# Patient Record
Sex: Male | Born: 1980 | Race: White | Hispanic: No
Health system: Midwestern US, Community
[De-identification: ages and names within clinical notes are randomized; demographics above are authoritative.]

## PROBLEM LIST (undated history)

## (undated) DIAGNOSIS — K219 Gastro-esophageal reflux disease without esophagitis: Secondary | ICD-10-CM

## (undated) DIAGNOSIS — F3181 Bipolar II disorder: Principal | ICD-10-CM

## (undated) DIAGNOSIS — K269 Duodenal ulcer, unspecified as acute or chronic, without hemorrhage or perforation: Secondary | ICD-10-CM

## (undated) DIAGNOSIS — G43909 Migraine, unspecified, not intractable, without status migrainosus: Secondary | ICD-10-CM

## (undated) DIAGNOSIS — I639 Cerebral infarction, unspecified: Secondary | ICD-10-CM

## (undated) DIAGNOSIS — G40909 Epilepsy, unspecified, not intractable, without status epilepticus: Secondary | ICD-10-CM

## (undated) HISTORY — PX: TONSILLECTOMY: SUR1361

## (undated) HISTORY — DX: Gastro-esophageal reflux disease without esophagitis: K21.9

## (undated) HISTORY — DX: Epilepsy, unspecified, not intractable, without status epilepticus: G40.909

---

## 2016-06-22 ENCOUNTER — Encounter: Admit: 2016-06-23

## 2016-06-22 DIAGNOSIS — R079 Chest pain, unspecified: Secondary | ICD-10-CM

## 2016-06-22 NOTE — ED Provider Notes (Signed)
Chest pain  HPI Comments: Patient is a 35 year old male presents for chest pain. The patient states that he was riding home from [place] car and he experienced a 9 out of 10 radiating to the left arm pressure sensation over his chest primarily located on the left side, this began roughly an hour ago. It is accompanied with weakness and numbness of his left upper and lower extremities. Currently has a bilateral headache with associated sharp pain that is pulsatile behind his left eye. The patient also complains of difficulty  Urination and pain over his left and right lower quadrants of his abdomen. He is also had nausea and vomiting over the last hour. He denies diarrhea or fever or chills. He has no history of DVT    The history is provided by the patient. No language interpreter was used.       Review of Systems   Constitutional: Negative for chills and fever.   HENT: Negative for rhinorrhea and sore throat.    Eyes: Positive for pain.   Respiratory: Positive for chest tightness and shortness of breath.    Cardiovascular: Positive for chest pain. Negative for palpitations.   Gastrointestinal: Positive for abdominal pain, nausea and vomiting. Negative for diarrhea.   Genitourinary: Positive for difficulty urinating.   Musculoskeletal: Negative for neck pain and neck stiffness.   Neurological: Positive for weakness, numbness and headaches.        Left-sided weakness and numbness in the upper and lower extremity    Headache is described as bilateral continuous, with sharp pains behind left eye.         Physical Exam   Constitutional: He is oriented to person, place, and time. He appears well-developed and well-nourished. He appears distressed.   HENT:   Head: Normocephalic and atraumatic.   Eyes: EOM are normal. Pupils are equal, round, and reactive to light.   Neck: Normal range of motion. Neck supple. No tracheal deviation present. No thyromegaly present.   Cardiovascular: Normal rate, regular rhythm, normal  heart sounds and intact distal pulses.  Exam reveals no gallop and no friction rub.    No murmur heard.  Pulmonary/Chest: Effort normal and breath sounds normal. No respiratory distress. He has no wheezes. He has no rales. He exhibits tenderness.   Abdominal: Soft. Bowel sounds are normal. He exhibits no distension. There is tenderness.   Tender to palpation over left and right lower quadrants and suprapubic tenderness.   Musculoskeletal: He exhibits no edema or deformity.   Decreased strength over left upper and lower extremity. Decreased sensation over  left  upper and lower extremity through all dermatomes   Neurological: He is alert and oriented to person, place, and time. No cranial nerve deficit.   Skin: He is not diaphoretic.     EKG Interpretation    Interpreted by emergency department physician    Rhythm: normal sinus   Rate: normal  Axis: normal  Ectopy: none  Conduction: normal  ST Segments: no acute change  T Waves: no acute change  Q Waves: none    Clinical Impression: no acute changes      Procedures    MDM    ED Course     NIH Stroke Scale/Score at time of initial evaluation:  1A: Level of Consciousness 0 - alert; keenly responsive   1B: Ask Month and Age 5 - answers both questions correctly   1C: Tell Patient To Open and Close Eyes, then Hand Grip Squeeze 0 - performs both  tasks correctly   2: Test Horizontal Extraocular Movements 0 - normal   3: Test Visual Fields 0 - no visual loss   4: Test Facial Palsy 0 - normal symmetric movement   5A: Test Left Arm Motor Drift 0 - no drift, limb holds 90 (or 45) degrees for full 10 seconds   5B: Test Right Arm Motor Drift 0 - no drift, limb holds 90 (or 45) degrees for full 10 seconds   6A: Test Left Leg Motor Drift 0 - no drift; leg holds 30 degree position for full 5 seconds   6B: Test Right Leg Motor Drift 0 - no drift; leg holds 30 degree position for full 5 seconds   7: Test Limb Ataxia   (FNF/Heel-Shin) 0 - absent   8: Test Sensation 2 - severe to total  sensory loss; patient is not aware of being touched in face, arm, leg   9: Test Language/Aphasia 0 - no aphasia, normal   10: Test Dysarthria 0 - normal   11: Test Extinction/Inattention 0 - no abnormality   Total 2         Bladder scan  185 ml      --------------------------------------------- PAST HISTORY ---------------------------------------------  Past Medical History:  has a past medical history of Anxiety; GERD (gastroesophageal reflux disease); Glaucoma; Personality disorder; PTSD (post-traumatic stress disorder); and PUD (peptic ulcer disease).    Past Surgical History:  has no past surgical history on file.    Social History:  reports that he has never smoked. He does not have any smokeless tobacco history on file. He reports that he does not drink alcohol or use illicit drugs.    Family History: family history is not on file.     The patient???s home medications have been reviewed.    Allergies: Penicillins    -------------------------------------------------- RESULTS -------------------------------------------------    LABS:  Results for orders placed or performed during the hospital encounter of 06/22/16   CBC auto differential   Result Value Ref Range    WBC 7.0 4.5 - 11.5 E9/L    RBC 4.91 3.80 - 5.80 E12/L    Hemoglobin 14.2 12.5 - 16.5 g/dL    Hematocrit 09.8 11.9 - 54.0 %    MCV 84.5 80.0 - 99.9 fL    MCH 28.9 26.0 - 35.0 pg    MCHC 34.2 32.0 - 34.5 %    RDW 12.1 11.5 - 15.0 fL    Platelets 244 130 - 450 E9/L    MPV 10.3 7.0 - 12.0 fL    Neutrophils % 52.7 43.0 - 80.0 %    Immature Granulocytes % 0.3 0.0 - 5.0 %    Lymphocytes % 34.6 20.0 - 42.0 %    Monocytes % 8.5 2.0 - 12.0 %    Eosinophils % 2.6 0.0 - 6.0 %    Basophils % 1.3 0.0 - 2.0 %    Neutrophils # 3.68 1.80 - 7.30 E9/L    Immature Granulocytes # 0.02 E9/L    Lymphocytes # 2.41 1.50 - 4.00 E9/L    Monocytes # 0.59 0.10 - 0.95 E9/L    Eosinophils # 0.18 0.05 - 0.50 E9/L    Basophils # 0.09 0.00 - 0.20 E9/L   Comprehensive metabolic panel    Result Value Ref Range    Sodium 140 132 - 146 mmol/L    Potassium 3.9 3.5 - 5.0 mmol/L    Chloride 102 98 - 107 mmol/L    CO2 25 22 -  29 mmol/L    Anion Gap 13 7 - 16 mmol/L    Glucose 93 74 - 109 mg/dL    BUN 9 6 - 20 mg/dL    CREATININE 1.0 0.7 - 1.2 mg/dL    GFR Non-African American >60 >=60 mL/min/1.73    GFR African American >60     Calcium 8.7 8.6 - 10.2 mg/dL    Total Protein 6.6 6.4 - 8.3 g/dL    Alb 3.8 3.5 - 5.2 g/dL    Total Bilirubin 0.2 0.0 - 1.2 mg/dL    Alkaline Phosphatase 74 40 - 129 U/L    ALT 13 0 - 40 U/L    AST 14 0 - 39 U/L   Troponin   Result Value Ref Range    Troponin <0.01 0.00 - 0.03 ng/mL   APTT   Result Value Ref Range    aPTT 33.7 24.5 - 35.1 sec   Protime-INR   Result Value Ref Range    Protime 11.9 9.3 - 12.4 sec    INR 1.0        RADIOLOGY:  XR Chest Limited One View   Final Result   1. Unremarkable chest.      CT Head WO Contrast   Final Result   1.  Unremarkable noncontrast CT of the brain.                 ------------------------- NURSING NOTES AND VITALS REVIEWED ---------------------------  Date / Time Roomed:  06/22/2016 10:13 PM  ED Bed Assignment:  01/01    The nursing notes within the ED encounter and vital signs as below have been reviewed.     Patient Vitals for the past 24 hrs:   BP Temp Temp src Pulse Resp SpO2 Height Weight   06/22/16 2222 132/82 98.2 ??F (36.8 ??C) Oral 64 18 96 % 6\' 1"  (1.854 m) 206 lb (93.4 kg)       Oxygen Saturation Interpretation: Normal    ------------------------------------------ PROGRESS NOTES ------------------------------------------  Re-evaluation(s):  Time: 2350.  Patient???s symptoms show no change  Repeat physical examination is not changed    Counseling:  I have spoken with the patient and discussed today???s results, in addition to providing specific details for the plan of care and counseling regarding the diagnosis and prognosis.  Their questions are answered at this time and they are agreeable with the plan of  admission.    --------------------------------- ADDITIONAL PROVIDER NOTES ---------------------------------  Consultations:  Time: Spoke with Dr. Ike Bene [facility]).  Discussed case.  They will accept the patient.  This patient's ED course included: a personal history and physicial examination, re-evaluation prior to disposition, multiple bedside re-evaluations, IV medications, cardiac monitoring and continuous pulse oximetry    This patient has remained hemodynamically stable during their ED course.    Diagnosis:  1. Chest pain, unspecified type    2. Left sided numbness        Disposition:  Patient's disposition: Transfer to [facility]  Patient's condition is stable.    ATTENDING PROVIDER ATTESTATION:     I have personally performed and/or participated in the history, exam, medical decision making, and procedures and agree with all pertinent clinical information.      I have also reviewed and agree with the past medical, family and social history unless otherwise noted.    I have discussed this patient in detail with the resident, and provided the instruction and education regarding headaches, chest pain, left sided numbness and CVA.  My findings/Plan: Heart RRR. Lungs CTA bilaterally. Abdomen soft, nontender. Bowel sounds normal.  NIH is 2 for left sided sensory loss. Will transfer to [facility] for neurological evaluation.       Ladon Applebaum, DO  06/23/16 [id number]

## 2016-06-23 ENCOUNTER — Observation Stay
Admit: 2016-06-23 | Discharge: 2016-06-26 | Disposition: A | Discharge status: Home / Self Care | Payer: MEDICAID | Source: Other Acute Inpatient Hospital | Attending: Internal Medicine | Admitting: Internal Medicine

## 2016-06-23 ENCOUNTER — Inpatient Hospital Stay
Admit: 2016-06-23 | Discharge: 2016-06-23 | Disposition: A | Discharge status: Other Acute Inpatient Hospital | Attending: Emergency Medicine

## 2016-06-23 LAB — TROPONIN
Troponin: <0.01 ng/mL (ref 0.00–0.03)
Troponin: <0.01 ng/mL (ref 0.00–0.03)
Troponin: <0.01 ng/mL (ref 0.00–0.03)

## 2016-06-23 LAB — CBC WITH AUTO DIFFERENTIAL
Basophils %: 1.3 % (ref 0.0–2.0)
Basophils Absolute: 0.09 E9/L (ref 0.00–0.20)
Eosinophils %: 2.6 % (ref 0.0–6.0)
Eosinophils Absolute: 0.18 E9/L (ref 0.05–0.50)
Hematocrit: 41.5 % (ref 37.0–54.0)
Hemoglobin: 14.2 g/dL (ref 12.5–16.5)
Immature Granulocytes #: 0.02 E9/L
Immature Granulocytes %: 0.3 % (ref 0.0–5.0)
Lymphocytes %: 34.6 % (ref 20.0–42.0)
Lymphocytes Absolute: 2.41 E9/L (ref 1.50–4.00)
MCH: 28.9 pg (ref 26.0–35.0)
MCHC: 34.2 % (ref 32.0–34.5)
MCV: 84.5 fL (ref 80.0–99.9)
MPV: 10.3 fL (ref 7.0–12.0)
Monocytes %: 8.5 % (ref 2.0–12.0)
Monocytes Absolute: 0.59 E9/L (ref 0.10–0.95)
Neutrophils %: 52.7 % (ref 43.0–80.0)
Neutrophils Absolute: 3.68 E9/L (ref 1.80–7.30)
Platelets: 244 E9/L (ref 130–450)
RBC: 4.91 E12/L (ref 3.80–5.80)
RDW: 12.1 fL (ref 11.5–15.0)
WBC: 7 E9/L (ref 4.5–11.5)

## 2016-06-23 LAB — URINALYSIS
Bilirubin Urine: NEGATIVE
Blood, Urine: NEGATIVE
Glucose, Ur: NEGATIVE mg/dL
Ketones, Urine: NEGATIVE mg/dL
Leukocyte Esterase, Urine: NEGATIVE
Nitrite, Urine: NEGATIVE
Protein, UA: NEGATIVE mg/dL
Specific Gravity, UA: 1.02 (ref 1.005–1.030)
Urobilinogen, Urine: 0.2 E.U./dL (ref <2.0)
pH, UA: 6 (ref 5.0–9.0)

## 2016-06-23 LAB — EKG 12-LEAD
Atrial Rate: 57 {beats}/min
Atrial Rate: 57 {beats}/min
P Axis: 19 degrees
P Axis: 5 degrees
P-R Interval: 172 ms
P-R Interval: 188 ms
Q-T Interval: 376 ms
Q-T Interval: 398 ms
QRS Duration: 82 ms
QRS Duration: 90 ms
QTc Calculation (Bazett): 365 ms
QTc Calculation (Bazett): 387 ms
R Axis: 52 degrees
R Axis: 69 degrees
T Axis: 37 degrees
T Axis: 49 degrees
Ventricular Rate: 57 {beats}/min
Ventricular Rate: 57 {beats}/min

## 2016-06-23 LAB — D-DIMER, QUANTITATIVE: D-Dimer, Quant: <200 ng/mL DDU

## 2016-06-23 LAB — URINE DRUG SCREEN
Amphetamine Screen, Urine: NOT DETECTED (ref <1000)
Amphetamine Screen, Urine: NOT DETECTED (ref <1000)
Barbiturate Screen, Ur: NOT DETECTED (ref <200)
Barbiturate Screen, Ur: NOT DETECTED (ref <200)
Benzodiazepine Screen, Urine: NOT DETECTED (ref <200)
Benzodiazepine Screen, Urine: NOT DETECTED (ref <200)
Cannabinoid Scrn, Ur: NOT DETECTED
Cannabinoid Scrn, Ur: NOT DETECTED
Cocaine Metabolite Screen, Urine: NOT DETECTED (ref <300)
Cocaine Metabolite Screen, Urine: NOT DETECTED (ref <300)
Methadone Screen, Urine: NOT DETECTED (ref <300)
Methadone Screen, Urine: NOT DETECTED (ref <300)
Opiate Scrn, Ur: NOT DETECTED
Opiate Scrn, Ur: NOT DETECTED
PCP Screen, Urine: NOT DETECTED (ref <25)
PCP Screen, Urine: NOT DETECTED (ref <25)
Propoxyphene Scrn, Ur: NOT DETECTED (ref <300)
Propoxyphene Scrn, Ur: NOT DETECTED (ref <300)

## 2016-06-23 LAB — COMPREHENSIVE METABOLIC PANEL
ALT: 13 U/L (ref 0–40)
AST: 14 U/L (ref 0–39)
Albumin: 3.8 g/dL (ref 3.5–5.2)
Alkaline Phosphatase: 74 U/L (ref 40–129)
Anion Gap: 13 mmol/L (ref 7–16)
BUN: 9 mg/dL (ref 6–20)
CO2: 25 mmol/L (ref 22–29)
Calcium: 8.7 mg/dL (ref 8.6–10.2)
Chloride: 102 mmol/L (ref 98–107)
Creatinine: 1 mg/dL (ref 0.7–1.2)
GFR African American: >60
GFR Non-African American: >60 mL/min/{1.73_m2} (ref >=60)
Glucose: 93 mg/dL (ref 74–109)
Potassium: 3.9 mmol/L (ref 3.5–5.0)
Sodium: 140 mmol/L (ref 132–146)
Total Bilirubin: 0.2 mg/dL (ref 0.0–1.2)
Total Protein: 6.6 g/dL (ref 6.4–8.3)

## 2016-06-23 LAB — PROTIME-INR
INR: 1
Protime: 11.9 s (ref 9.3–12.4)

## 2016-06-23 LAB — APTT: aPTT: 33.7 s (ref 24.5–35.1)

## 2016-06-23 MED ORDER — LOPERAMIDE HCL 2 MG PO CAPS
2 MG | ORAL | Status: DC | PRN
Start: 2016-06-23 — End: 2016-06-26

## 2016-06-23 MED ORDER — ONDANSETRON HCL 4 MG/2ML IJ SOLN
4 MG/2ML | INTRAMUSCULAR | Status: DC
Start: 2016-06-23 — End: 2016-06-23

## 2016-06-23 MED ORDER — PRAZOSIN HCL 2 MG PO CAPS
2 MG | Freq: Every evening | ORAL | Status: DC
Start: 2016-06-23 — End: 2016-06-26
  Administered 2016-06-24 – 2016-06-26 (×3): 2 mg via ORAL

## 2016-06-23 MED ORDER — NORTRIPTYLINE HCL 25 MG PO CAPS
25 MG | Freq: Every evening | ORAL | Status: DC
Start: 2016-06-23 — End: 2016-06-26
  Administered 2016-06-24 – 2016-06-26 (×3): 50 mg via ORAL

## 2016-06-23 MED ORDER — METOCLOPRAMIDE HCL 5 MG/ML IJ SOLN
5 MG/ML | Freq: Once | INTRAMUSCULAR | Status: AC
Start: 2016-06-23 — End: 2016-06-22
  Administered 2016-06-23: 03:00:00 10 mg via INTRAVENOUS

## 2016-06-23 MED ORDER — SIMETHICONE 80 MG PO CHEW
80 MG | Freq: Four times a day (QID) | ORAL | Status: DC
Start: 2016-06-23 — End: 2016-06-26
  Administered 2016-06-23 – 2016-06-26 (×13): 80 mg via ORAL

## 2016-06-23 MED ORDER — METOCLOPRAMIDE HCL 5 MG/ML IJ SOLN
5 MG/ML | Freq: Once | INTRAMUSCULAR | Status: DC
Start: 2016-06-23 — End: 2016-06-26

## 2016-06-23 MED ORDER — PANTOPRAZOLE SODIUM 40 MG PO TBEC
40 MG | Freq: Every day | ORAL | Status: DC
Start: 2016-06-23 — End: 2016-06-26
  Administered 2016-06-23 – 2016-06-26 (×3): 40 mg via ORAL

## 2016-06-23 MED ORDER — KETOROLAC TROMETHAMINE 30 MG/ML IJ SOLN
30 MG/ML | Freq: Once | INTRAMUSCULAR | Status: DC
Start: 2016-06-23 — End: 2016-06-23

## 2016-06-23 MED ORDER — SODIUM CHLORIDE 0.9 % IV SOLN
0.9 % | INTRAVENOUS | Status: DC
Start: 2016-06-23 — End: 2016-06-26
  Administered 2016-06-23 – 2016-06-24 (×3): via INTRAVENOUS

## 2016-06-23 MED ORDER — ACETAMINOPHEN 325 MG PO TABS
325 MG | ORAL | Status: DC | PRN
Start: 2016-06-23 — End: 2016-06-26
  Administered 2016-06-23: 10:00:00 650 mg via ORAL

## 2016-06-23 MED ORDER — DIPHENHYDRAMINE HCL 50 MG/ML IJ SOLN
50 MG/ML | Freq: Four times a day (QID) | INTRAMUSCULAR | Status: DC | PRN
Start: 2016-06-23 — End: 2016-06-26

## 2016-06-23 MED ORDER — ALUM & MAG HYDROXIDE-SIMETH 200-200-20 MG/5ML PO SUSP
200-200-20 MG/5ML | ORAL | Status: DC | PRN
Start: 2016-06-23 — End: 2016-06-26

## 2016-06-23 MED ORDER — ASPIRIN 81 MG PO CHEW
81 MG | Freq: Once | ORAL | Status: AC
Start: 2016-06-23 — End: 2016-06-22
  Administered 2016-06-23: 03:00:00 324 mg via ORAL

## 2016-06-23 MED ORDER — VALPROATE SODIUM 100 MG/ML IV SOLN
100 MG/ML | Freq: Once | INTRAVENOUS | Status: AC
Start: 2016-06-23 — End: 2016-06-23
  Administered 2016-06-23: 19:00:00 1000 mg via INTRAVENOUS

## 2016-06-23 MED ORDER — GABAPENTIN 600 MG PO TABS
600 MG | Freq: Every evening | ORAL | Status: DC
Start: 2016-06-23 — End: 2016-06-26
  Administered 2016-06-24 – 2016-06-26 (×3): 600 mg via ORAL

## 2016-06-23 MED ORDER — DIPHENHYDRAMINE HCL 50 MG/ML IJ SOLN
50 MG/ML | Freq: Once | INTRAMUSCULAR | Status: AC
Start: 2016-06-23 — End: 2016-06-22
  Administered 2016-06-23: 03:00:00 25 mg via INTRAVENOUS

## 2016-06-23 MED ORDER — NORMAL SALINE FLUSH 0.9 % IV SOLN
0.9 % | Freq: Two times a day (BID) | INTRAVENOUS | Status: DC
Start: 2016-06-23 — End: 2016-06-26

## 2016-06-23 MED ORDER — ONDANSETRON HCL 4 MG/2ML IJ SOLN
4 MG/2ML | Freq: Four times a day (QID) | INTRAMUSCULAR | Status: DC | PRN
Start: 2016-06-23 — End: 2016-06-26

## 2016-06-23 MED ORDER — ENOXAPARIN SODIUM 40 MG/0.4ML SC SOLN
40 MG/0.4ML | Freq: Every day | SUBCUTANEOUS | Status: DC
Start: 2016-06-23 — End: 2016-06-26
  Administered 2016-06-23 – 2016-06-25 (×3): 40 mg via SUBCUTANEOUS

## 2016-06-23 MED ORDER — METOCLOPRAMIDE HCL 5 MG/ML IJ SOLN
5 MG/ML | Freq: Once | INTRAMUSCULAR | Status: DC
Start: 2016-06-23 — End: 2016-06-23

## 2016-06-23 MED ORDER — BUSPIRONE HCL 10 MG PO TABS
10 MG | Freq: Two times a day (BID) | ORAL | Status: DC
Start: 2016-06-23 — End: 2016-06-26
  Administered 2016-06-23 – 2016-06-26 (×7): 10 mg via ORAL

## 2016-06-23 MED ORDER — METOCLOPRAMIDE HCL 5 MG/ML IJ SOLN
5 MG/ML | Freq: Four times a day (QID) | INTRAMUSCULAR | Status: DC | PRN
Start: 2016-06-23 — End: 2016-06-26
  Administered 2016-06-23: 10:00:00 10 mg via INTRAVENOUS

## 2016-06-23 MED ORDER — DIVALPROEX SODIUM ER 500 MG PO TB24
500 MG | Freq: Once | ORAL | Status: DC
Start: 2016-06-23 — End: 2016-06-23

## 2016-06-23 MED ORDER — MAGNESIUM SULFATE 2000 MG/50 ML IVPB PREMIX
2 GM/50ML | Freq: Once | INTRAVENOUS | Status: AC
Start: 2016-06-23 — End: 2016-06-23
  Administered 2016-06-23: 21:00:00 2 g via INTRAVENOUS

## 2016-06-23 MED ORDER — OXYCODONE-ACETAMINOPHEN 5-325 MG PO TABS
5-325 MG | ORAL | Status: DC | PRN
Start: 2016-06-23 — End: 2016-06-26
  Administered 2016-06-23 – 2016-06-26 (×14): 1 via ORAL

## 2016-06-23 MED ORDER — LATANOPROST 0.005 % OP SOLN
0.005 % | Freq: Every evening | OPHTHALMIC | Status: DC
Start: 2016-06-23 — End: 2016-06-26
  Administered 2016-06-24 (×2): 1 [drp] via OPHTHALMIC

## 2016-06-23 MED ORDER — KETOROLAC TROMETHAMINE 30 MG/ML IJ SOLN
30 MG/ML | Freq: Four times a day (QID) | INTRAMUSCULAR | Status: DC | PRN
Start: 2016-06-23 — End: 2016-06-26
  Administered 2016-06-23 – 2016-06-25 (×5): 30 mg via INTRAVENOUS

## 2016-06-23 MED ORDER — SODIUM CHLORIDE 0.9 % IV SOLN
0.9 % | Freq: Once | INTRAVENOUS | Status: AC
Start: 2016-06-23 — End: 2016-06-23
  Administered 2016-06-23: 03:00:00 1000 mL via INTRAVENOUS

## 2016-06-23 MED ORDER — MAGNESIUM HYDROXIDE 400 MG/5ML PO SUSP
400 MG/5ML | Freq: Every day | ORAL | Status: DC | PRN
Start: 2016-06-23 — End: 2016-06-26
  Administered 2016-06-24 (×2): 30 mL via ORAL

## 2016-06-23 MED ORDER — METOCLOPRAMIDE HCL 5 MG/ML IJ SOLN
5 MG/ML | Freq: Four times a day (QID) | INTRAMUSCULAR | Status: DC
Start: 2016-06-23 — End: 2016-06-23
  Administered 2016-06-23: 19:00:00 10 mg via INTRAVENOUS

## 2016-06-23 MED ORDER — NORMAL SALINE FLUSH 0.9 % IV SOLN
0.9 % | INTRAVENOUS | Status: DC | PRN
Start: 2016-06-23 — End: 2016-06-26
  Administered 2016-06-23: 10:00:00 10 mL via INTRAVENOUS

## 2016-06-23 MED FILL — CHILDRENS ASPIRIN 81 MG PO CHEW: 81 MG | ORAL | Qty: 4

## 2016-06-23 MED FILL — DIPHENHYDRAMINE HCL 50 MG/ML IJ SOLN: 50 MG/ML | INTRAMUSCULAR | Qty: 1

## 2016-06-23 MED FILL — OXYCODONE-ACETAMINOPHEN 5-325 MG PO TABS: 5-325 MG | ORAL | Qty: 1

## 2016-06-23 MED FILL — NORTRIPTYLINE HCL 25 MG PO CAPS: 25 MG | ORAL | Qty: 2

## 2016-06-23 MED FILL — BUSPIRONE HCL 10 MG PO TABS: 10 MG | ORAL | Qty: 1

## 2016-06-23 MED FILL — VALPROATE SODIUM 500 MG/5ML IV SOLN: 500 MG/5ML | INTRAVENOUS | Qty: 10

## 2016-06-23 MED FILL — METOCLOPRAMIDE HCL 5 MG/ML IJ SOLN: 5 MG/ML | INTRAMUSCULAR | Qty: 2

## 2016-06-23 MED FILL — GABAPENTIN 600 MG PO TABS: 600 MG | ORAL | Qty: 1

## 2016-06-23 MED FILL — XALATAN 0.005 % OP SOLN: 0.005 % | OPHTHALMIC | Qty: 2.5

## 2016-06-23 MED FILL — MAGNESIUM SULFATE 2 GM/50ML IV SOLN: 2 GM/50ML | INTRAVENOUS | Qty: 50

## 2016-06-23 MED FILL — SIMETHICONE 80 MG PO CHEW: 80 MG | ORAL | Qty: 1

## 2016-06-23 MED FILL — DIVALPROEX SODIUM ER 500 MG PO TB24: 500 MG | ORAL | Qty: 2

## 2016-06-23 MED FILL — ONDANSETRON HCL 4 MG/2ML IJ SOLN: 4 MG/2ML | INTRAMUSCULAR | Qty: 2

## 2016-06-23 MED FILL — TYLENOL 325 MG PO TABS: 325 MG | ORAL | Qty: 2

## 2016-06-23 MED FILL — SODIUM CHLORIDE 0.9 % IV SOLN: 0.9 % | INTRAVENOUS | Qty: 1000

## 2016-06-23 MED FILL — NORMAL SALINE FLUSH 0.9 % IV SOLN: 0.9 % | INTRAVENOUS | Qty: 10

## 2016-06-23 MED FILL — PANTOPRAZOLE SODIUM 40 MG PO TBEC: 40 MG | ORAL | Qty: 1

## 2016-06-23 MED FILL — NORMAL SALINE FLUSH 0.9 % IV SOLN: 0.9 % | INTRAVENOUS | Qty: 30

## 2016-06-23 MED FILL — KETOROLAC TROMETHAMINE 30 MG/ML IJ SOLN: 30 MG/ML | INTRAMUSCULAR | Qty: 1

## 2016-06-23 MED FILL — PRAZOSIN HCL 2 MG PO CAPS: 2 MG | ORAL | Qty: 1

## 2016-06-23 MED FILL — LOVENOX 40 MG/0.4ML SC SOLN: 40 MG/0.4ML | SUBCUTANEOUS | Qty: 0.4

## 2016-06-23 NOTE — Plan of Care (Signed)
Problem: Falls - Risk of  Goal: Absence of falls  Outcome: Met This Shift

## 2016-06-23 NOTE — Progress Notes (Signed)
Pt sees Dr. Lester Kinsman at [facility] clinic in [place], [place].

## 2016-06-23 NOTE — H&P (Addendum)
Hospital Medicine History & Physical      PCP: No primary care provider on file.    Date of Admission: 06/23/2016    Date of Service: Pt seen/examined on 06/23/16 and placed in Observation.    Chief Complaint:  Left sided weakness / headache / chest pain      History Of Present Illness:    35 y.o. male with PMH below who presented to [facility]. [facility] as a transfer from [facility] with a variety of complaints, including severe headache, left-sided arm and leg weakness, and chest pain.  History was provided mostly by the pt's fiancee, who is a very good historian. The patient states that he was riding home from [place] by car, and he experienced weakness and numbness of his left arm and leg yesterday evening.  He also complained of significant sharp, pulsatile pain behind his left eye.  The pt has a history of migraines, treated by a neurologist at the [place].  He states that the headache pain is not new (rates it as "12/10"), but the weakness and numbness on the left side is new as of yesterday.  In addition, he complains of chest pain on the left side (feels "on fire"), along with right-sided chest pressure.  The patient also complains of difficulty with urination, and pain over his lower abdomen.  He also has complained of nausea and vomiting, but denies diarrhea, fever, or chills.  His fiancee is also concerned that the pt is having trouble expressing his thoughts since these symptoms began.      Past Medical History:          Diagnosis Date   ??? Anxiety    ??? GERD (gastroesophageal reflux disease)    ??? Glaucoma    ??? Personality disorder    ??? PTSD (post-traumatic stress disorder)    ??? PUD (peptic ulcer disease)        Past Surgical History:      No past surgical history on file.    Medications Prior to Admission:      Prior to Admission medications    Medication Sig Start Date End Date Taking? Authorizing Provider   loperamide (IMODIUM) 2 MG capsule Take 2 mg by mouth as needed for Diarrhea    Historical Provider,  MD   gabapentin (NEURONTIN) 600 MG tablet Take 600 mg by mouth nightly    Historical Provider, MD   Cholecalciferol (VITAMIN D3) 3000 units TABS Take by mouth    Historical Provider, MD   latanoprost (XALATAN) 0.005 % ophthalmic solution 1 drop nightly    Historical Provider, MD   nortriptyline (PAMELOR) 50 MG capsule Take 50 mg by mouth nightly    Historical Provider, MD   simethicone (MYLICON) 80 MG chewable tablet Take 80 mg by mouth 4 times daily (before meals and nightly)    Historical Provider, MD   busPIRone (BUSPAR) 10 MG tablet Take 10 mg by mouth 2 times daily    Historical Provider, MD   omeprazole (PRILOSEC) 20 MG delayed release capsule Take 20 mg by mouth 2 times daily    Historical Provider, MD   prazosin (MINIPRESS) 2 MG capsule Take 2 mg by mouth nightly    Historical Provider, MD       Allergies:  Penicillins    Social History:      The patient currently lives at home with family.    TOBACCO:   reports that he has never smoked. He does not have any smokeless  tobacco history on file.  ETOH:   reports that he does not drink alcohol.      Family History:       Reviewed in detail and negative for DM, CAD, Cancer, CVA. Positive as follows:    No family history on file.    REVIEW OF SYSTEMS:   Pertinent positives as noted in the HPI. All other systems reviewed and negative.    PHYSICAL EXAM:    BP 109/71   Pulse 57   Temp 97.2 ??F (36.2 ??C) (Oral)    Resp 16    General appearance:  No apparent distress, appears ill, otherwise cooperative.  HEENT:  Normal cephalic, atraumatic.  Extra ocular muscles intact. Conjunctivae/corneas clear.  Neck: Supple, no jugular venous distention. Trachea midline.  Respiratory:  Normal respiratory effort. Clear to auscultation, bilaterally without Rales/Wheezes/Rhonchi.  Cardiovascular:  Regular rate and rhythm with normal S1/S2 without murmurs, rubs or gallops.  Abdomen: Soft, non-tender, non-distended with normal bowel sounds.  Musculoskeletal:  No clubbing, cyanosis or  edema bilaterally.  Full range of motion without deformity.  Skin: Skin color, texture, turgor normal.  No rashes or lesions.  Neurologic:  Motor strength intact on R side; 4-/5 strength throughout left UE and LE. Cranial nerves: II-XII intact.  Psychiatric:  Alert and oriented, thought content appropriate  Peripheral Pulses: +2 palpable, equal bilaterally       Labs:     Recent Labs      06/22/16   2245   WBC  7.0   HGB  14.2   HCT  41.5   PLT  244     Recent Labs      06/22/16   2245   NA  140   K  3.9   CL  102   CO2  25   BUN  9   CREATININE  1.0   CALCIUM  8.7     Recent Labs      06/22/16   2245   AST  14   ALT  13   BILITOT  0.2   ALKPHOS  74     Recent Labs      06/22/16   2245   INR  1.0     Recent Labs      06/22/16   2245   TROPONINI  <0.01       Urinalysis:      Lab Results   Component Value Date    NITRU Negative 06/23/2016    BLOODU Negative 06/23/2016    SPECGRAV 1.020 06/23/2016    GLUCOSEU Negative 06/23/2016       Radiology:     CXR: I have reviewed the CXR with the following interpretation: No acute cardiopulmonary process  EKG:  I have reviewed the EKG with the following interpretation: Sinus bradycardia, no acute ischemic changes    No orders to display       ASSESSMENT:  1. Left-sided weakness -- likely secondary to complex migraine  2. Chest pain, atypical (likely musculoskeletal)  3. Severe headache (history of migraine)  4. Urinary retention  5. History of peptic ulcer disease  6. GERD  7. PTSD  8. Personality disorder      PLAN:  1. Admit to monitored bed.  2. Trend serial troponins; continue telemetry.  3. Continue pain control for headache/migraine, nausea control.  4. F/u MRI brain.  If abnormal or symptoms worsen, consult Neurology.  5. Continue home medications.  6. IV fluid hydration.  7. PT/OT consult.  8. Supportive care.       DVT Prophylaxis: Lovenox  Diet:    Code Status: No Order    PT/OT Eval Status:     Dispo - Treatment as above; anticipate discharge in ~2 days or when  otherwise stable.       Judd Lien, MD    Thank you No primary care provider on file. for the opportunity to be involved in this patient's care. If you have any questions or concerns please feel free to contact me at [phone].

## 2016-06-23 NOTE — Progress Notes (Signed)
Orange medication sheets and blue stroke bag/ education went over with pt. All questions answered.

## 2016-06-23 NOTE — Progress Notes (Signed)
Pt is not current with flu and pneumonia vaccinations, reports adverse reactions: fever, lethargy.

## 2016-06-23 NOTE — Progress Notes (Signed)
Patient stating the Toradol and tylenol are not helping his back pain. Text page sent to Dr Quentin Mulling to see if something else can be ordered. Awaiting new orders.

## 2016-06-23 NOTE — Progress Notes (Signed)
Spoke w/ Dr. Ike Bene about pt having chest pain and Ekg was performed and it showed SB, and vitals are stable. He stated he will be up to see patient.

## 2016-06-23 NOTE — Progress Notes (Signed)
Evaluating Therapist: Chrystine Oiler, DPT    Recommendation for discharge is home with assist prn    Room #: 8515/8515-A  DIAGNOSIS: L sided weakness   PRECAUTIONS: falls risk     Social:  Pt lives with fiance in a 1 story floor plan 4-5 steps  to enter. There is also a portable ramp entrance.  Prior to admission pt walked without AD     Initial Evaluation  Date: 06/23/16 Treatment      Short Term/ Long Term   Goals   Was pt agreeable to Eval/treatment? Yes      Does pt have pain? 10/10     Bed Mobility  Rolling: supA  Supine to sit: supA  Sit to supine: supA  Scooting: supA  Independent    Transfers Sit to stand: SBA  Stand to sit: SBA  Stand pivot: SBA  supA   Ambulation    2x20 feet with FWW with CGA  >50 feet with AAD with SBA   Stair negotiation: ascended and descended NT  4-5 steps with 1 rail with SBA   AM-PAC Raw Score  18/24       Pt is alert and Oriented x3  BLE ROM is WFL.  BLE strength is grossly 4/5.  Balance: FAIR  Sensation: impaired sensation to LLE  Edema: nil  Endurance: FAIR  Skin was inspected: yes       ASSESSMENT  Pt displays functional ability as noted in the objective portion of this evaluation.      Comments/Treatment:  Pt reported increased dizziness throughout. Patient initially stood and walked approx 4-5 feet. Had increased dizziness. Sat x31mins in chair. Felt better. Walked with FWW to bathroom. Independent with toileting. Ambulated back to bed with FWW. Placed in bed with call button.     Patient education  Pt educated on ambulation with FWW    Patient response to education:   Pt verbalized understanding Pt demonstrated skill Pt requires further education in this area   Yes  Yes  no     Rehab potential is Good for reaching above PT goals.    Pt???s/ family goals   1. To have less pain    Patient and or family understand(s) diagnosis, prognosis, and plan of care.    PLAN  PT care will be provided in accordance with the objectives noted above.  Whenever appropriate, clear delegation  orders will be provided for nursing staff.  Exercises and functional mobility practice will be used as well as appropriate assistive devices or modalities to obtain goals. Patient and family education will also be administered as needed.    Frequency of treatments will be 2-5x/week x 5days.    Time in: 1120  Time out: 1140    Chrystine Oiler, DPT  [facility] [phone]

## 2016-06-23 NOTE — Progress Notes (Signed)
Call to MRI [phone], they will not be able to get MRI done until tomorrow.

## 2016-06-23 NOTE — Consults (Signed)
Larry Koch is a 35 y.o. right handed man    Neurology was consulted for left sided weakness and intractable headache.     Past Medical History:     Past Medical History:   Diagnosis Date   ??? Anxiety    ??? GERD (gastroesophageal reflux disease)    ??? Glaucoma    ??? Personality disorder    ??? PTSD (post-traumatic stress disorder)    ??? PUD (peptic ulcer disease)        Past Surgical History:     Past Surgical History:   Procedure Laterality Date   ??? TONSILLECTOMY         Allergies:     Other and Penicillins    Medications:     Prior to Admission medications    Medication Sig Start Date End Date Taking? Authorizing Provider   aluminum & magnesium hydroxide-simethicone (MAALOX) 200-200-20 MG/5ML SUSP suspension Take 30 mLs by mouth as needed for Indigestion   Yes Historical Provider, MD   loperamide (IMODIUM) 2 MG capsule Take 2 mg by mouth as needed for Diarrhea    Historical Provider, MD   gabapentin (NEURONTIN) 600 MG tablet Take 600 mg by mouth nightly    Historical Provider, MD   Cholecalciferol (VITAMIN D3) 3000 units TABS Take by mouth    Historical Provider, MD   latanoprost (XALATAN) 0.005 % ophthalmic solution 1 drop nightly    Historical Provider, MD   nortriptyline (PAMELOR) 50 MG capsule Take 50 mg by mouth nightly    Historical Provider, MD   simethicone (MYLICON) 80 MG chewable tablet Take 80 mg by mouth 4 times daily (before meals and nightly)    Historical Provider, MD   busPIRone (BUSPAR) 10 MG tablet Take 10 mg by mouth 2 times daily    Historical Provider, MD   omeprazole (PRILOSEC) 20 MG delayed release capsule Take 20 mg by mouth 2 times daily    Historical Provider, MD   prazosin (MINIPRESS) 2 MG capsule Take 2 mg by mouth nightly    Historical Provider, MD       Social History:     Denies ETOH, tobacco, or illicit drugs    Review of Systems:     + chest pain no palpitations  No SOB  No vertigo, lightheadedness or loss of consciousness  No falls, tripping + stumbling  No incontinence of bowels or  bladder  No itching or bruising appreciated  + for  numbness, tingling left  focal arm/leg weakness      Family History:     History reviewed. No pertinent family history.     History of Present Illness:     The patient is 35 year old man. With past medical history of GERD, PTSD and glaucoma  He presents to the ED for left sided weakness, intractable left sided headache and chest pain.     The patient mentions he was driving back from [place] around 3 pm  Yesterday when he felt sharp pain  behind his left eye. He alsostarted to feel left side numbness, wekness and tingling. The patient was not feeling well for a couple of days and he was not able to keep any food or fluid he had multiple episodes of non bloody non bilious vomiting but he maintained his hydration. He was also lethargic and was stumbling and he describes a double vision . He also describes symptoms of difficulty urinating. . Over an hour drive. He denies any fever, chills. His fiance at  bed side describes seizure movents while the patient is asleep but the EEG in the past did not reveal any epileptiform events. Patient has strong history of migraine and he has been battling it for 12 past years " nothing worked out for me" and [facility]     On arrival to [facility] patient's vital signs were stable. EKG was NSR. INHSS was 2 for severe to total sensory loss. CT head was unremarkable. Patient was transferred to [facility] for neurology consult.     Objective:     BP 119/69   Pulse 62   Temp 97.6 ??F (36.4 ??C)   Resp 16   Ht  (1.854 m)   Wt 206 lb (93.4 kg)   SpO2 96%   BMI 27.18 kg/m2      General appearance: alert, appears stated age, cooperative and in mild distress  Head: normocephalic, without obvious abnormality, atraumatic  Eyes: conjunctivae/corneas clear. .  Neck: no adenopathy, no carotid bruit, supple, symmetrical, trachea midline and thyroid not enlarged, symmetric, no tenderness/mass/nodules  Back:  symmetric, no curvature. ROM normal.   Lungs: clear to auscultation bilaterally  Heart: regular rate and rhythm, S1, S2 normal, no murmur, click, rub or gallop  Abdomen: soft, non-tender; bowel sounds normal; no masses,  no organomegaly  Extremities: normal, atraumatic, no cyanosis or edema  Pulses: 2+ and symmetric  Skin: color, texture, turgor normal---no rashes or lesions     Mental Status: alert, oriented, thought content appropriate  Speech: no dysarthria  Language: no aphasias    Cranial Nerves:  I: smell    II: visual acuity     II: visual fields  loss of peripheral vision    II: pupils PERL   III,VII: ptosis None   III,IV,VI: extraocular muscles  Difficulty with horizontal eye movement.     V: mastication Normal   V: facial light touch sensation  Normal   V,VII: corneal reflex  Present   VII: facial muscle function - upper  Normal   VII: facial muscle function - lower Normal   VIII: hearing Normal   IX: soft palate elevation  Normal   IX,X: gag reflex Present   XI: trapezius strength  5/5   XI: sternocleidomastoid strength 5/5   XI: neck extension strength  5/5   XII: tongue strength  Normal     Motor:  Right Grip  5/5              Left Grip  4/5               Right Bicep  5/5           Left Bicep  4/5              Right Triceps   5/5       Left Triceps  4/5          Right Deltoid  5/5     Left Deltoid  4/5         Right IPS  5/5            Left IPS  4/5               Right Quadriceps  5/5          Left Quadriceps    4/5           Right Gastrocnemius    5/5    Left Gastrocnemius   4/5  Right  Ant Tibialis   5/5  Left Ant Tibialis   4/5   5/5 throughout  Normal bulk and tone   No drift  No abnormal movements    Sensory:  LT and PP normal  Vibration normal     Coordination:   FN, FFM and RAM normal  HS normal    Gait:  Patient was weak to walk.     DTR:   Right Brachioradialis reflex 0  Left Brachioradialis reflex 0  Right Biceps reflex 0  Left Biceps reflex 0  Right Triceps reflex 0  Left Triceps reflex  0  Right Quadriceps reflex 2+  Left Quadriceps reflex 2+  Right Achilles reflex 2+  Left Achilles reflex 2+      No Babinskis  No Hoffman's     No other pathological reflexes    Laboratory/Radiology:     CBC:   Lab Results   Component Value Date    WBC 7.0 06/22/2016    RBC 4.91 06/22/2016    HGB 14.2 06/22/2016    HCT 41.5 06/22/2016    MCV 84.5 06/22/2016    MCH 28.9 06/22/2016    MCHC 34.2 06/22/2016    RDW 12.1 06/22/2016    PLT 244 06/22/2016    MPV 10.3 06/22/2016     Troponin:    Lab Results   Component Value Date    TROPONINI <0.01 06/23/2016       CT head:  No acute abnormalities     MRI brain:  Is pending     Assessment:     The patient is 35 year old male who presents with intractable headache and left sided weakness of his body.   His symptoms can be explained due to his migraine ie complex migraine attack. Or it could be due other neurologic conditions such as MS.   His migraine medications needs to be optimized   I will order an MRI brain     Plan:     MRI brain with and without contrast  Depakote 1 mg IV   Reglan 20 mg    Magnesium 2 mg   PTOT evaluation      Hale Bogus, M.D PGY 2  Internal Medicine Resident  Pager: [phone]  06/23/2016    Addendum:  This is a 35 year old man with history of chronic daily migraine headaches.  He is presenting with an exacerbation of his usual headache  in addition to headache he also has numbness of his left hemibody including face.     On exam and status is normal, cranial nerves are normal.  Initially is very poor effort on the left side however with encouragement he is able to give full strength.  He reports total numbness to all modalities left side.  Vibratory sensation splits of the forehead which is not physiological.     Assessment: Suspect patient has migraine headaches focal numbness may be of complex migraine, however, there is at least some degree of embellishment of symptoms.  Patient is nontoxic appearing with normal vitals and normal white  count.  I do not suspect any infectious etiology of headache.    Perform MRI to rule out secondary causes.  Treat for presumptive migraine with. Valproic acid 1 gram, 2grams magnesium and reglan. All given IV.     I spent 90 minutes personally with the pt and family in examination, chart and imaging review and treatment of the pt's disorder ----in addition to discussing the case with the resident.  Verlan Friends, MD

## 2016-06-23 NOTE — Progress Notes (Signed)
Consult information given to Dr Hickey

## 2016-06-24 ENCOUNTER — Observation Stay: Admit: 2016-06-24

## 2016-06-24 LAB — CBC WITH AUTO DIFFERENTIAL
Basophils %: 0.7 % (ref 0.0–2.0)
Basophils Absolute: 0.04 E9/L (ref 0.00–0.20)
Eosinophils %: 1.9 % (ref 0.0–6.0)
Eosinophils Absolute: 0.1 E9/L (ref 0.05–0.50)
Hematocrit: 42.8 % (ref 37.0–54.0)
Hemoglobin: 14.4 g/dL (ref 12.5–16.5)
Immature Granulocytes #: 0.01 E9/L
Immature Granulocytes %: 0.2 % (ref 0.0–5.0)
Lymphocytes %: 27 % (ref 20.0–42.0)
Lymphocytes Absolute: 1.44 E9/L — ABNORMAL LOW (ref 1.50–4.00)
MCH: 28.5 pg (ref 26.0–35.0)
MCHC: 33.6 % (ref 32.0–34.5)
MCV: 84.8 fL (ref 80.0–99.9)
MPV: 10.1 fL (ref 7.0–12.0)
Monocytes %: 7.7 % (ref 2.0–12.0)
Monocytes Absolute: 0.41 E9/L (ref 0.10–0.95)
Neutrophils %: 62.5 % (ref 43.0–80.0)
Neutrophils Absolute: 3.34 E9/L (ref 1.80–7.30)
Platelets: 222 E9/L (ref 130–450)
RBC: 5.05 E12/L (ref 3.80–5.80)
RDW: 12.4 fL (ref 11.5–15.0)
WBC: 5.3 E9/L (ref 4.5–11.5)

## 2016-06-24 LAB — BASIC METABOLIC PANEL
Anion Gap: 10 mmol/L (ref 7–16)
BUN: 9 mg/dL (ref 6–20)
CO2: 26 mmol/L (ref 22–29)
Calcium: 8.3 mg/dL — ABNORMAL LOW (ref 8.6–10.2)
Chloride: 104 mmol/L (ref 98–107)
Creatinine: 1.1 mg/dL (ref 0.7–1.2)
GFR African American: >60
GFR Non-African American: >60 mL/min/{1.73_m2} (ref >=60)
Glucose: 115 mg/dL — ABNORMAL HIGH (ref 74–109)
Potassium: 4 mmol/L (ref 3.5–5.0)
Sodium: 140 mmol/L (ref 132–146)

## 2016-06-24 MED ORDER — SODIUM CHLORIDE 0.9 % IJ SOLN
0.9 % | Freq: Two times a day (BID) | INTRAMUSCULAR | Status: DC
Start: 2016-06-24 — End: 2016-06-26
  Administered 2016-06-24 – 2016-06-26 (×2): 10 mL via INTRAVENOUS

## 2016-06-24 MED ORDER — GADOVERSETAMIDE 330.9 MG/ML IV SOLN
330.9 MG/ML | Freq: Once | INTRAVENOUS | Status: AC | PRN
Start: 2016-06-24 — End: 2016-06-24
  Administered 2016-06-24: 11:00:00 20 mL via INTRAVENOUS

## 2016-06-24 MED FILL — NORMAL SALINE FLUSH 0.9 % IV SOLN: 0.9 % | INTRAVENOUS | Qty: 10

## 2016-06-24 MED FILL — GABAPENTIN 600 MG PO TABS: 600 MG | ORAL | Qty: 1

## 2016-06-24 MED FILL — METOCLOPRAMIDE HCL 5 MG/ML IJ SOLN: 5 MG/ML | INTRAMUSCULAR | Qty: 2

## 2016-06-24 MED FILL — KETOROLAC TROMETHAMINE 30 MG/ML IJ SOLN: 30 MG/ML | INTRAMUSCULAR | Qty: 1

## 2016-06-24 MED FILL — SIMETHICONE 80 MG PO CHEW: 80 MG | ORAL | Qty: 1

## 2016-06-24 MED FILL — BUSPIRONE HCL 10 MG PO TABS: 10 MG | ORAL | Qty: 1

## 2016-06-24 MED FILL — ONDANSETRON HCL 4 MG/2ML IJ SOLN: 4 MG/2ML | INTRAMUSCULAR | Qty: 2

## 2016-06-24 MED FILL — MILK OF MAGNESIA 7.75 % PO SUSP: 7.75 % | ORAL | Qty: 30

## 2016-06-24 MED FILL — OXYCODONE-ACETAMINOPHEN 5-325 MG PO TABS: 5-325 MG | ORAL | Qty: 1

## 2016-06-24 MED FILL — PRAZOSIN HCL 2 MG PO CAPS: 2 MG | ORAL | Qty: 1

## 2016-06-24 MED FILL — NORMAL SALINE FLUSH 0.9 % IV SOLN: 0.9 % | INTRAVENOUS | Qty: 40

## 2016-06-24 MED FILL — LOVENOX 40 MG/0.4ML SC SOLN: 40 MG/0.4ML | SUBCUTANEOUS | Qty: 0.4

## 2016-06-24 MED FILL — NORTRIPTYLINE HCL 25 MG PO CAPS: 25 MG | ORAL | Qty: 2

## 2016-06-24 NOTE — Progress Notes (Signed)
Hospitalist Progress Note      PCP: No primary care provider on file.    Date of Admission: 06/23/2016      Subjective:     Severe HA, ongoing weakness/numbness of left side  Wife at bedside  Pt uncomfortable from HA      Medications:  Reviewed    Infusion Medications   ??? sodium chloride 50 mL/hr at 06/24/16 0400     Scheduled Medications   ??? sodium chloride (PF)  10 mL Intravenous BID   ??? busPIRone  10 mg Oral BID   ??? gabapentin  600 mg Oral Nightly   ??? latanoprost  1 drop Both Eyes Nightly   ??? nortriptyline  50 mg Oral Nightly   ??? pantoprazole  40 mg Oral QAM AC   ??? prazosin  2 mg Oral Nightly   ??? simethicone  80 mg Oral 4x Daily AC & HS   ??? sodium chloride flush  10 mL Intravenous 2 times per day   ??? enoxaparin  40 mg Subcutaneous Daily   ??? metoclopramide  10 mg Intravenous Once     PRN Meds: aluminum & magnesium hydroxide-simethicone, loperamide, sodium chloride flush, acetaminophen, magnesium hydroxide, ondansetron, diphenhydrAMINE, ketorolac, metoclopramide, oxyCODONE-acetaminophen      Intake/Output Summary (Last 24 hours) at 06/24/16 1331  Last data filed at 06/24/16 0647   Gross per 24 hour   Intake             1640 ml   Output              700 ml   Net              940 ml       Exam:    BP (!) 142/81   Pulse 73   Temp 97.4 ??F (36.3 ??C) (Oral)    Resp 20   Ht  (1.854 m)   Wt 206 lb (93.4 kg)   SpO2 94%   BMI 27.18 kg/m2    General appearance:  No apparent distress, appears ill, otherwise cooperative.  HEENT:  Normal cephalic, atraumatic.  Extra ocular muscles intact. Conjunctivae/corneas clear.  Neck: Supple, no jugular venous distention. Trachea midline.  Respiratory:  Normal respiratory effort. Clear to auscultation, bilaterally without Rales/Wheezes/Rhonchi.  Cardiovascular:  Regular rate and rhythm with normal S1/S2 without murmurs, rubs or gallops.  Abdomen: Soft, non-tender, non-distended with normal bowel sounds.  Musculoskeletal:  No clubbing, cyanosis or edema bilaterally.  Full range of  motion without deformity.  Skin: Skin color, texture, turgor normal.  No rashes or lesions.  Neurologic:  Motor strength intact on R side; 4-/5 strength throughout left UE and LE. Cranial nerves: II-XII intact.  Psychiatric:  Alert and oriented, thought content appropriate  Peripheral Pulses: +2 palpable, equal bilaterally       Labs:   Recent Labs      06/22/16   2245  06/24/16   1120   WBC  7.0  5.3   HGB  14.2  14.4   HCT  41.5  42.8   PLT  244  222     Recent Labs      06/22/16   2245  06/24/16   1120   NA  140  140   K  3.9  4.0   CL  102  104   CO2  25  26   BUN  9  9   CREATININE  1.0  1.1   CALCIUM  8.7  8.3*     Recent Labs      06/22/16   2245   AST  14   ALT  13   BILITOT  0.2   ALKPHOS  74     Recent Labs      06/22/16   2245   INR  1.0     Recent Labs      06/22/16   2245  06/23/16   0427  06/23/16   0901   TROPONINI  <0.01  <0.01  <0.01       Urinalysis:    Lab Results   Component Value Date    NITRU Negative 06/23/2016    BLOODU Negative 06/23/2016    SPECGRAV 1.020 06/23/2016    GLUCOSEU Negative 06/23/2016       Radiology:  MRI Brain W WO Contrast   Final Result   Mild paranasal sinus disease. Otherwise essentially unremarkable MRI   of the brain performed without and with contrast for the patient's   age.              35 y.o. male with PMH below who presented to [facility]. [facility] as a transfer from [facility] with a variety of complaints, including severe headache, left-sided arm and leg weakness, and chest pain.  History was provided mostly by the pt's fiancee, who is a very good historian. The patient states that he was riding home from [place] by car, and he experienced weakness and numbness of his left arm and leg yesterday evening.  He also complained of significant sharp, pulsatile pain behind his left eye.  The pt has a history of migraines, treated by a neurologist at the [place].  He states that the headache pain is not new (rates it as "12/10"), but the weakness and numbness on the left side  is new as of yesterday.  In addition, he complains of chest pain on the left side (feels "on fire"), along with right-sided chest pressure.  The patient also complains of difficulty with urination, and pain over his lower abdomen.  He also has complained of nausea and vomiting, but denies diarrhea, fever, or chills.  His fiancee is also concerned that the pt is having trouble expressing his thoughts since these symptoms began.  ??      ASSESSMENT:  1. Left-sided weakness -- likely secondary to complex migraine vs MS   2. Chest pain, atypical (likely musculoskeletal)  3. Severe headache (history of migraine)  4. Urinary retention  5. History of peptic ulcer disease  6. GERD  7. PTSD  8. Personality disorder  ??  ??  PLAN:  1. Admit to monitored bed.  2. Trend serial troponins; continue telemetry. Negative d-dimer, NEGATIVE UDS  3. Continue pain control for headache/migraine, nausea control.  4. MRI brain negative  5. Continue home medications.  6. IV fluid hydration.  7. PT/OT consult.  8. Supportive care.  9. Echo  10- further care for HA/MIGRAINE per neuro, d/w pt/wife       ??  DVT Prophylaxis: Lovenox  Diet:    Code Status: No Order  ??  PT/OT Eval Status:   ??  Dispo - Treatment as above; anticipate discharge in ~2 days or when otherwise stable.    Janett Billow, MD

## 2016-06-24 NOTE — Progress Notes (Signed)
Occupational Therapy  OCCUPATIONAL THERAPY INITIAL EVALUATION      Date:06/24/2016  Patient Name: Larry Koch  MRN: [id number]  DOB: 16-Jun-1981  Room: 8515/8515-A     Evaluating OT: Jill Side, OTR/L [phone]    Discharge Recommendations: [facility] OT     AM-PAC Inpatient Daily Activity Raw Score:  14/24    Recommended Adaptive Equipment: TBD     Diagnosis: There is no problem list on file for this patient.      Surgery:   Past Surgical History:   Procedure Laterality Date   ??? TONSILLECTOMY          Past Medical History:   Past Medical History:   Diagnosis Date   ??? Anxiety    ??? GERD (gastroesophageal reflux disease)    ??? Glaucoma    ??? Personality disorder    ??? PTSD (post-traumatic stress disorder)    ??? PUD (peptic ulcer disease)        Precautions: falls     Home Living: Lives with fiance and family in 1-story house with 4-5 steps to enter   Bathroom setup: walk-in shower  Equipment owned: shower railing/built-in seat/hand-held shower, WC    Prior Level of Function: indep with ADLs/IADLs/func mobility       Pain Level: pt reports pain in  L shldr     Cognition: oriented x 3; follows 2-step directions.   Sup Problem solving skills  indep Memory   Sup Sequencing  SBA Safety Awareness/Judgement  Additional Comments: N/A     Sensory:   Hearing: WFL  Vision: wears glasses; hx of glaucoma; pt reports he is unable to open L eyelid all the way, when asked for him to open both eyes as much as possible, pt opened both eyes slightly, then closed them again.       UE Assessment:  Hand Dominance: Right [x]   Left []      Strength ROM                    Grip Additional Info:    RUE               4/5                     WFL                    WFL                     N/A      LUE 3/5 shldr flex 0-50* d/t pt report of pain, WFL distally                    WFL                     N/A        Sensation: pt reports numbness throughout L UE, unable to locate stim, reports pain with ROM to L shldr; pt's finacee reports he only feels constant pain  in L UE   Tone: normal  Edema:N/A   FMC: WFL  GMC: WFL    Functional Assessment:   Current Status  Comments   Feeding  NT    Grooming  Mod A     Upper Body Dressing Mod A       Lower Body Dressing MaxA      Bathing Max A     Toileting  NT  Bed Mobility  SBA     Functional Transfers SBA   Sit to stand    Functional Mobility SBA  Side step to [facility]      Sit balance: indep  Stand balance: SBA   Endurance/Activity tolerance: fair                              Comments: Upon arrival, pt supine in bed.  At end of session, pt supine in bed with all devices within reach. Fiancee present.       Treatment: Patient reports L eyelid won't open, when asked to try to open both eyes, both eyelids lift the same level & then pt closes eyes again. Patient reports numbness throughout L UE. Attempted AAROM to L shldr, pt grimaced and reported pain in shldr. When tested, pt unable to locate tactile stim throughout L distal UE. Patient stood and sidestepped to [facility] with SBA for safety. Pt's fiancee reports concern that pt is exhibiting new symptoms since admission. Steffanie Rainwater also reports that their 2 children and her brother also live with them and will be able to provide assistance at home. Pt will benefit from OT services to regain L UE function.     Assessment of current deficits   Functional mobility [x]   ADLs [x]  Strength [x]   Cognition [x]   Functional transfers  [x]  IADLs [x]  Safety Awareness [x]   Endurance [x]   Fine Motor Coordination []  Balance [x]  Vision/perception []  Sensation []    Gross Motor Coordination []  ROM [x]         Eval Complexity: low  Profile and History- low  Assessment of Occupational Performance and Identification of Deficits- mod  Clinical Decision Making- low    Treatment frequency: PRN     Plan of Care:  ADL retraining [x]    Equipment needs [x]    Neuromuscular re-education [x]  Energy Conservation Techniques []   Functional Transfer training [x]  Patient and/or Family Education [x]   Functional Mobility training  [x]   Environmental Modifications []   Cognitive re-training [x]    Splinting Needs []      Positioning to improve overall function []   Other: []       Rehab Potential: good for established goals     Patient / Family Goal: to feel better and go home     Pt/Family participated in establishment of the following goals:     Time Frame: 1 week   1) Perform grooming, self-feeding with Mod I.   2) Perform UB bath/drsg with setup.  3) Perform LB bath/drsg with setup.   4) Perform func trf/mobility with Sup & DME as needed.   5) Perform UE HEP with Sup & written instruction.  Pt/Family educated on safety, L UE ROM and OT POC.  Patient and/or family verbalizes understanding of diagnosis, prognosis, goals and plan of care: yes    []  Malnutrition indicators have been identified and nursing has been notified to ensure a dietitian consult is ordered.     Time in: 2:15 pm   Time out: 2:30 pm   Eval Time: 15 min   Total Tx Time: 0 min     Jill Side, OTR/L [phone]

## 2016-06-24 NOTE — Progress Notes (Signed)
Patient off the floor to MRI in wheelchair.  IV heplocked.  Will continue to monitor.

## 2016-06-24 NOTE — Progress Notes (Signed)
Larry Koch is a 34 y.o. right handed man    Neurology following for headache and left side weakness     Patient seen and examined he still complaining of left side retrobulbar headache and he mentions he has difficulty opening his left eye. He denies any chest pain palpitation abdominal pain or dizziness.      Objective:     BP 113/75   Pulse 81   Temp 97.5 ??F (36.4 ??C) (Oral)    Resp 18   Ht  (1.854 m)   Wt 206 lb (93.4 kg)   SpO2 94%   BMI 27.18 kg/m2      General appearance: alert, appears stated age, cooperative and in mild distress, wears glasses   Head: normocephalic, without obvious abnormality, atraumatic  Eyes: conjunctivae/corneas clear. .  Neck: no adenopathy, no carotid bruit, supple, symmetrical, trachea midline and thyroid not enlarged, symmetric, no tenderness/mass/nodules  Back: symmetric, no curvature. ROM normal.   Lungs: clear to auscultation bilaterally  Heart: regular rate and rhythm, S1, S2 normal, no murmur, click, rub or gallop  Abdomen: soft, non-tender; bowel sounds normal; no masses,  no organomegaly  Extremities: normal, atraumatic, no cyanosis or edema  Pulses: 2+ and symmetric  Skin: color, texture, turgor normal---no rashes or lesions     Mental Status: alert, oriented, thought content appropriate  Speech: no dysarthria  Language: no aphasias    Cranial Nerves:  II: visual fields  loss of peripheral vision    II: pupils PERL   III,VII: ptosis None   III,IV,VI: extraocular muscles  Difficulty with horizontal eye movement.     V: mastication Normal   V: facial light touch sensation  Normal   V,VII: corneal reflex  Present   VII: facial muscle function - upper  Normal   VII: facial muscle function - lower Normal   VIII: hearing Normal   IX: soft palate elevation  Normal   IX,X: gag reflex Present   XI: trapezius strength  5/5   XI: sternocleidomastoid strength 5/5   XI: neck extension strength  5/5   XII: tongue strength  Normal     Motor:  Right Grip  5/5              Left  Grip  4/5               Right Bicep  5/5           Left Bicep  4/5              Right Triceps   5/5       Left Triceps  4/5          Right Deltoid  5/5     Left Deltoid  4/5         Right IPS  5/5            Left IPS  4/5               Right Quadriceps  5/5          Left Quadriceps    4/5           Right Gastrocnemius    5/5    Left Gastrocnemius   4/5  Right Ant Tibialis   5/5  Left Ant Tibialis   4/5   5/5 throughout  Normal bulk and tone   No drift  No abnormal movements    Sensory:  LT and PP normal  Vibration normal  Coordination:   FN, FFM and RAM normal  HS normal    Gait:  Patient was weak to walk.     DTR:   Right Brachioradialis reflex 0  Left Brachioradialis reflex 0  Right Biceps reflex 0  Left Biceps reflex 0  Right Triceps reflex 0  Left Triceps reflex 0  Right Quadriceps reflex 2+  Left Quadriceps reflex 2+  Right Achilles reflex 2+  Left Achilles reflex 2+      No Babinskis  No Hoffman's     No other pathological reflexes    Laboratory/Radiology:     CBC:   Lab Results   Component Value Date    WBC 7.0 06/22/2016    RBC 4.91 06/22/2016    HGB 14.2 06/22/2016    HCT 41.5 06/22/2016    MCV 84.5 06/22/2016    MCH 28.9 06/22/2016    MCHC 34.2 06/22/2016    RDW 12.1 06/22/2016    PLT 244 06/22/2016    MPV 10.3 06/22/2016     Troponin:    Lab Results   Component Value Date    TROPONINI <0.01 06/23/2016       CT head:  No acute abnormalities     MRI brain:  Mild paranasal sinus disease. Unremarkable otherwise.     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

## 2016-06-24 NOTE — Plan of Care (Signed)
Problem: NEUROLOGICAL DEFICIT  Goal: Neurological status is stable or improving  Outcome: Met This Shift

## 2016-06-25 LAB — BASIC METABOLIC PANEL
Anion Gap: 12 mmol/L (ref 7–16)
BUN: 8 mg/dL (ref 6–20)
CO2: 26 mmol/L (ref 22–29)
Calcium: 8.6 mg/dL (ref 8.6–10.2)
Chloride: 105 mmol/L (ref 98–107)
Creatinine: 1.1 mg/dL (ref 0.7–1.2)
GFR African American: >60
GFR Non-African American: >60 mL/min/{1.73_m2} (ref >=60)
Glucose: 102 mg/dL (ref 74–109)
Potassium: 4.1 mmol/L (ref 3.5–5.0)
Sodium: 143 mmol/L (ref 132–146)

## 2016-06-25 LAB — CBC
Hematocrit: 44.5 % (ref 37.0–54.0)
Hemoglobin: 15.1 g/dL (ref 12.5–16.5)
MCH: 28.6 pg (ref 26.0–35.0)
MCHC: 33.9 % (ref 32.0–34.5)
MCV: 84.3 fL (ref 80.0–99.9)
MPV: 10 fL (ref 7.0–12.0)
Platelets: 239 E9/L (ref 130–450)
RBC: 5.28 E12/L (ref 3.80–5.80)
RDW: 12.4 fL (ref 11.5–15.0)
WBC: 5.4 E9/L (ref 4.5–11.5)

## 2016-06-25 LAB — ECHOCARDIOGRAM COMPLETE WITH BUBBLE STUDY: Left Ventricular Ejection Fraction: 65

## 2016-06-25 MED FILL — PRAZOSIN HCL 2 MG PO CAPS: 2 MG | ORAL | Qty: 1

## 2016-06-25 MED FILL — GABAPENTIN 600 MG PO TABS: 600 MG | ORAL | Qty: 1

## 2016-06-25 MED FILL — BUSPIRONE HCL 10 MG PO TABS: 10 MG | ORAL | Qty: 1

## 2016-06-25 MED FILL — METOCLOPRAMIDE HCL 5 MG/ML IJ SOLN: 5 MG/ML | INTRAMUSCULAR | Qty: 2

## 2016-06-25 MED FILL — KETOROLAC TROMETHAMINE 30 MG/ML IJ SOLN: 30 MG/ML | INTRAMUSCULAR | Qty: 1

## 2016-06-25 MED FILL — LOVENOX 40 MG/0.4ML SC SOLN: 40 MG/0.4ML | SUBCUTANEOUS | Qty: 0.4

## 2016-06-25 MED FILL — OXYCODONE-ACETAMINOPHEN 5-325 MG PO TABS: 5-325 MG | ORAL | Qty: 1

## 2016-06-25 MED FILL — SIMETHICONE 80 MG PO CHEW: 80 MG | ORAL | Qty: 1

## 2016-06-25 MED FILL — NORTRIPTYLINE HCL 25 MG PO CAPS: 25 MG | ORAL | Qty: 2

## 2016-06-25 MED FILL — NORMAL SALINE FLUSH 0.9 % IV SOLN: 0.9 % | INTRAVENOUS | Qty: 10

## 2016-06-25 MED FILL — NORMAL SALINE FLUSH 0.9 % IV SOLN: 0.9 % | INTRAVENOUS | Qty: 30

## 2016-06-25 MED FILL — PANTOPRAZOLE SODIUM 40 MG PO TBEC: 40 MG | ORAL | Qty: 1

## 2016-06-25 NOTE — Progress Notes (Signed)
Hospitalist Progress Note      PCP: No primary care provider on file.    Date of Admission: 06/23/2016      Subjective:     Still has HA, ongoing weakness/numbness of left side--except left feet not numb today  Pt uncomfortable from HA  Has 12 yrs of migraines      Medications:  Reviewed    Infusion Medications   ??? sodium chloride 50 mL/hr at 06/24/16 1804     Scheduled Medications   ??? sodium chloride (PF)  10 mL Intravenous BID   ??? busPIRone  10 mg Oral BID   ??? gabapentin  600 mg Oral Nightly   ??? latanoprost  1 drop Both Eyes Nightly   ??? nortriptyline  50 mg Oral Nightly   ??? pantoprazole  40 mg Oral QAM AC   ??? prazosin  2 mg Oral Nightly   ??? simethicone  80 mg Oral 4x Daily AC & HS   ??? sodium chloride flush  10 mL Intravenous 2 times per day   ??? enoxaparin  40 mg Subcutaneous Daily   ??? metoclopramide  10 mg Intravenous Once     PRN Meds: aluminum & magnesium hydroxide-simethicone, loperamide, sodium chloride flush, acetaminophen, magnesium hydroxide, ondansetron, diphenhydrAMINE, ketorolac, metoclopramide, oxyCODONE-acetaminophen      Intake/Output Summary (Last 24 hours) at 06/25/16 1116  Last data filed at 06/25/16 0558   Gross per 24 hour   Intake           433.62 ml   Output                0 ml   Net           433.62 ml       Exam:    BP 123/80   Pulse 68   Temp 98.3 ??F (36.8 ??C)   Resp 18   Ht 6\' 1"  (1.854 m)   Wt 206 lb (93.4 kg)   SpO2 94%   BMI 27.18 kg/m2    General appearance:  No apparent distress, appears ill, otherwise cooperative.  HEENT:  Normal cephalic, atraumatic.  Extra ocular muscles intact. Conjunctivae/corneas clear.  Neck: Supple, no jugular venous distention. Trachea midline.  Respiratory:  Normal respiratory effort. Clear to auscultation, bilaterally without Rales/Wheezes/Rhonchi.  Cardiovascular:  Regular rate and rhythm with normal S1/S2 without murmurs, rubs or gallops.  Abdomen: Soft, non-tender, non-distended with normal bowel sounds.  Musculoskeletal:  No clubbing, cyanosis or  edema bilaterally.  Full range of motion without deformity.  Skin: Skin color, texture, turgor normal.  No rashes or lesions.  Neurologic:  Motor strength intact on R side; 4-/5 strength throughout left UE and LE. Cranial nerves: II-XII intact.  Psychiatric:  Alert and oriented, thought content appropriate  Peripheral Pulses: +2 palpable, equal bilaterally       Labs:   Recent Labs      06/22/16   2245  06/24/16   1120  06/25/16   0621   WBC  7.0  5.3  5.4   HGB  14.2  14.4  15.1   HCT  41.5  42.8  44.5   PLT  244  222  239     Recent Labs      06/22/16   2245  06/24/16   1120  06/25/16   0621   NA  140  140  143   K  3.9  4.0  4.1   CL  102  104  105  CO2  BUN  CREATININE  1.0  1.1  1.1   CALCIUM  8.7  8.3*  8.6     Recent Labs      06/22/16   2245   AST  14   ALT  13   BILITOT  0.2   ALKPHOS  74     Recent Labs      06/22/16   2245   INR  1.0     Recent Labs      06/22/16   2245  06/23/16   0427  06/23/16   0901   TROPONINI  <0.01  <0.01  <0.01       Urinalysis:      Lab Results   Component Value Date    NITRU Negative 06/23/2016    BLOODU Negative 06/23/2016    SPECGRAV 1.020 06/23/2016    GLUCOSEU Negative 06/23/2016       Radiology:  MRI Brain W WO Contrast   Final Result   Mild paranasal sinus disease. Otherwise essentially unremarkable MRI   of the brain performed without and with contrast for the patient's   age.              35 y.o. male with PMH below who presented to [facility]. [facility] as a transfer from [facility] with a variety of complaints, including severe headache, left-sided arm and leg weakness, and chest pain.  History was provided mostly by the pt's fiancee, who is a very good historian. The patient states that he was riding home from [place] by car, and he experienced weakness and numbness of his left arm and leg yesterday evening.  He also complained of significant sharp, pulsatile pain behind his left eye.  The pt has a history of migraines, treated by a neurologist at  the [place].  He states that the headache pain is not new (rates it as "12/10"), but the weakness and numbness on the left side is new as of yesterday.  In addition, he complains of chest pain on the left side (feels "on fire"), along with right-sided chest pressure.  The patient also complains of difficulty with urination, and pain over his lower abdomen.  He also has complained of nausea and vomiting, but denies diarrhea, fever, or chills.  His fiancee is also concerned that the pt is having trouble expressing his thoughts since these symptoms began.  ??      ASSESSMENT:  1. Left-sided weakness -- likely secondary to complex migraine vs MS less likely  2. Chest pain, atypical (likely musculoskeletal)  3. Severe headache (history of migraine)  4. Urinary retention  5. History of peptic ulcer disease  6. GERD  7. PTSD  8. Personality disorder  ??  ??  PLAN:  1. Admit to monitored bed.  2. Trend serial troponins; continue telemetry. Negative d-dimer, NEGATIVE UDS  3. Continue pain control for headache/migraine, nausea control.  4. MRI brain negative  5. Continue home medications.  6. IV fluid hydration.  7. PT/OT consult.  8. Supportive care.  9. Echo pending  10- further care for HA/MIGRAINE per neuro, d/w pt/wife       ??  DVT Prophylaxis: Lovenox  Diet:    Code Status: No Order  ??  PT/OT Eval Status:   ??  Dispo - Treatment as above; anticipate discharge in ~2 days or when otherwise stable.    Janett Billow, MD

## 2016-06-25 NOTE — Procedures (Signed)
Echo completed by KR

## 2016-06-25 NOTE — Plan of Care (Signed)
Problem: Pain:  Goal: Pain level will decrease  Pain level will decrease   Outcome: Met This Shift  Goal: Control of chronic pain  Control of chronic pain   Outcome: Met This Shift    Problem: Falls - Risk of  Goal: Absence of falls  Outcome: Met This Shift    Problem: NEUROLOGICAL DEFICIT  Goal: Neurological status is stable or improving  Outcome: Met This Shift

## 2016-06-25 NOTE — Progress Notes (Signed)
Larry Koch is a 35 y.o. right handed man     Neurology was consulted for headache and left sided numbness and weakness    History of Present Illness:     The patient was seen and examined this morning. He still reports left retrobulbar headache; however, he is now able to open the left eye better than yesterday. He also reports that the headache has moved and is now affecting his right eye, although the pain is less severe than behind the left eye. He has a history of glaucoma and states that he last saw his eye doctor 2 months ago at which time he was prescribed Latanoprost. He says he is compliant with the nightly eye drops. Otherwise, he reports dizziness and double vision when he walks to and from the restroom. He denies any fevers, chills, N/V, or diarrhea.     He notes chronic daily headaches for years--- minimally responsive to numerous medications. He is currently on nortriptyline 50 mg at night with minimal success. He felt that Depakote cocktail was somewhat helpful. He is on disability due to his headaches.---Dr T    He claims his headaches are 10/10 but he is sitting with the bright lights on, with 3 relatives in the room and watching TV---Dr T    But he denies other medical issues although he initially presented with chest pain.---Dr T    Objective:     BP 120/72   Pulse 62   Temp 98 ??F (36.7 ??C) (Temporal)    Resp 16   Ht 6\' 1"  (1.854 m)   Wt 206 lb (93.4 kg)   SpO2 94%   BMI 27.18 kg/m2    General appearance: alert, appears stated age, cooperative and no distress  Head: normocephalic, without obvious abnormality, atraumatic  Eyes: conjunctivae/corneas clear.  Neck: no adenopathy, no carotid bruit, supple, symmetrical, trachea midline and thyroid not enlarged, symmetric, no tenderness/mass/nodules  Back: symmetric, no curvature. ROM normal.   Lungs: clear to auscultation bilaterally  Heart: regular rate and rhythm, S1, S2 normal, no murmur, click, rub or gallop  Abdomen: soft, non-tender; bowel  sounds normal; no masses,  no organomegaly  Extremities: normal, atraumatic, no cyanosis or edema  Pulses: 2+ and symmetric  Skin: color, texture, turgor normal---no rashes or lesions     Mental Status: alert, oriented, thought content appropriate  Speech: no dysarthria  Language: no aphasias    Cranial Nerves:  I: smell    II: visual acuity     II: visual fields Loss of peripheral vision   II: pupils PERL   III,VII: ptosis None   III,IV,VI: extraocular muscles  Difficulty with horizontal eye movement   V: mastication Normal   V: facial light touch sensation  Normal   V,VII: corneal reflex  Present   VII: facial muscle function - upper  Normal   VII: facial muscle function - lower Normal   VIII: hearing Normal   IX: soft palate elevation  Normal   IX,X: gag reflex Present   XI: trapezius strength  5/5   XI: sternocleidomastoid strength 5/5   XI: neck extension strength  5/5   XII: tongue strength  Normal     Motor:  Right Grip  5/5    --- 5/5 for me throughout--- Dr T          Left Grip  4/5               Right Bicep  5/5  Left Bicep  4/5              Right Triceps   5/5       Left Triceps  4/5          Right Deltoid  5/5     Left Deltoid  4/5         Right IPS  5/5            Left IPS  4/5               Right Quadriceps  5/5          Left Quadriceps    4/5           Right Gastrocnemius    5/5    Left Gastrocnemius   4/5  Right Ant Tibialis   5/5  Left Ant Tibialis   4/5   Normal bulk and tone   No drift  No abnormal movements    Sensory:  LT, PP, Vibration normal on the right  Unable to feel LT, PP,and  vibration at all on left side---Dr T  Some sensation to LT at dorsum of left foot    Coordination:   FN, FFM and RAM normal  HS normal    Gait:  Histrionic gait---Dr T    DTR:   Right Brachioradialis reflex 2+  Left Brachioradialis reflex 2+  Right Biceps reflex 2+  Left Biceps reflex 2+  Right Triceps reflex 2+  Left Triceps reflex 2+  Right Quadriceps reflex 2+  Left Quadriceps reflex 2+  Right Achilles  reflex 2+  Left Achilles reflex 2+    No Babinskis  No Hoffman's   No other pathological reflexes    His neuro exam reveals only no physiological sensory loss and gait unsteadiness---Dr T    Laboratory/Radiology:   CBC with Differential:    Lab Results   Component Value Date    WBC 5.4 06/25/2016    RBC 5.28 06/25/2016    HGB 15.1 06/25/2016    HCT 44.5 06/25/2016    PLT 239 06/25/2016    MCV 84.3 06/25/2016    MCH 28.6 06/25/2016    MCHC 33.9 06/25/2016    RDW 12.4 06/25/2016    LYMPHOPCT 27.0 06/24/2016    MONOPCT 7.7 06/24/2016    BASOPCT 0.7 06/24/2016    MONOSABS 0.41 06/24/2016    LYMPHSABS 1.44 06/24/2016    EOSABS 0.10 06/24/2016    BASOSABS 0.04 06/24/2016     CMP:    Lab Results   Component Value Date    NA 143 06/25/2016    K 4.1 06/25/2016    CL 105 06/25/2016    CO2 26 06/25/2016    BUN 8 06/25/2016    CREATININE 1.1 06/25/2016    GFRAA >60 06/25/2016    LABGLOM >60 06/25/2016    GLUCOSE 102 06/25/2016    PROT 6.6 06/22/2016    LABALBU 3.8 06/22/2016    CALCIUM 8.6 06/25/2016    BILITOT 0.2 06/22/2016    ALKPHOS 74 06/22/2016    AST 14 06/22/2016    ALT 13 06/22/2016     MRI brain: Mild paranasal sinus disease, otherwise normal---Dr T    Echo w bubble: negative    Assessment:     This 35 y/o man presents with chest pain and now severe headaches. His neuro exam reveals only non-physiological findings. I doubt severe headaches or any history of significant headaches. I fear he is malingering because he is  on full disability for his pain. I did not tell him this diagnosis. I recommended headache clinic referral in [place].    His general exam is also unremarkable.    Plan:     May use Depakote for now    Garfield Cornea, OMS-III  10:23 AM  06/25/2016     Recommend headache clinic referral    Sign off    I spent 50 minutes personally with the pt and family in examination, chart and imaging review and treatment of the pt's disorder ----in addition to discussing the case with the medical student.

## 2016-06-26 ENCOUNTER — Encounter: Payer: PRIVATE HEALTH INSURANCE | Attending: Adult Health

## 2016-06-26 LAB — BASIC METABOLIC PANEL
Anion Gap: 10 mmol/L (ref 7–16)
BUN: 9 mg/dL (ref 6–20)
CO2: 28 mmol/L (ref 22–29)
Calcium: 8.6 mg/dL (ref 8.6–10.2)
Chloride: 102 mmol/L (ref 98–107)
Creatinine: 1 mg/dL (ref 0.7–1.2)
GFR African American: >60
GFR Non-African American: >60 mL/min/{1.73_m2} (ref >=60)
Glucose: 81 mg/dL (ref 74–109)
Potassium: 4.2 mmol/L (ref 3.5–5.0)
Sodium: 140 mmol/L (ref 132–146)

## 2016-06-26 LAB — CBC
Hematocrit: 43.1 % (ref 37.0–54.0)
Hemoglobin: 14.5 g/dL (ref 12.5–16.5)
MCH: 28.8 pg (ref 26.0–35.0)
MCHC: 33.6 % (ref 32.0–34.5)
MCV: 85.5 fL (ref 80.0–99.9)
MPV: 10.5 fL (ref 7.0–12.0)
Platelets: 235 E9/L (ref 130–450)
RBC: 5.04 E12/L (ref 3.80–5.80)
RDW: 12.4 fL (ref 11.5–15.0)
WBC: 4.6 E9/L (ref 4.5–11.5)

## 2016-06-26 MED ORDER — ACETAMINOPHEN 325 MG PO TABS
325 MG | ORAL_TABLET | ORAL | 0 refills | Status: DC | PRN
Start: 2016-06-26 — End: 2020-10-09

## 2016-06-26 MED ORDER — DIVALPROEX SODIUM ER 500 MG PO TB24
500 MG | Freq: Every day | ORAL | Status: DC
Start: 2016-06-26 — End: 2016-06-26

## 2016-06-26 MED ORDER — DIVALPROEX SODIUM ER 500 MG PO TB24
500 MG | ORAL_TABLET | Freq: Every day | ORAL | 3 refills | Status: DC
Start: 2016-06-26 — End: 2020-10-09

## 2016-06-26 MED FILL — SIMETHICONE 80 MG PO CHEW: 80 MG | ORAL | Qty: 1

## 2016-06-26 MED FILL — LOVENOX 40 MG/0.4ML SC SOLN: 40 MG/0.4ML | SUBCUTANEOUS | Qty: 0.4

## 2016-06-26 MED FILL — DIVALPROEX SODIUM ER 500 MG PO TB24: 500 MG | ORAL | Qty: 1

## 2016-06-26 MED FILL — OXYCODONE-ACETAMINOPHEN 5-325 MG PO TABS: 5-325 MG | ORAL | Qty: 1

## 2016-06-26 MED FILL — PANTOPRAZOLE SODIUM 40 MG PO TBEC: 40 MG | ORAL | Qty: 1

## 2016-06-26 NOTE — Discharge Summary (Signed)
Hospital Medicine Discharge Summary    Patient: Larry Koch     Gender: male  DOB: December 27, 1980   Age: 35 y.o.  MRN: [id number]    Code Status:  Full code    Primary Care Provider: No primary care provider on file.    Admit Date: 06/23/2016   Discharge Date: 06/26/2016      Admitting Physician: Marca Ancona, MD  Discharge Physician: Verl Bangs, DO     Discharge Diagnoses:  Left-sided weakness -- likely secondary to complex migraine vs MS less likely  2. Chest pain, atypical (likely musculoskeletal)  3. Severe??headache (history of??migraine)  4. Urinary retention  5. History of peptic ulcer disease  6. GERD  7. PTSD  8. Personality disorder  ????      Hospital Course: pt admitted with chest pain, and migrains, thought to be malingering, workup neg, per neurology, may try depakote, which was started      Disposition:  Home    Exam:     BP 114/70   Pulse 64   Temp 97.7 ??F (36.5 ??C)   Resp 16   Ht 6\' 1"  (1.854 m)   Wt 206 lb (93.4 kg)   SpO2 97%   BMI 27.18 kg/m2    General appearance:  No apparent distress, appears stated age and cooperative.  HEENT:  Normal cephalic, atraumatic without obvious deformity. Pupils equal, round, and reactive to light.  Extra ocular muscles intact. Conjunctivae/corneas clear.  Neck: Supple, with full range of motion. No jugular venous distention. Trachea midline.  Respiratory:  Normal respiratory effort. Clear to auscultation, bilaterally without Rales/Wheezes/Rhonchi.  Cardiovascular:  Regular rate and rhythm with normal S1/S2 without murmurs, rubs or gallops.   Abdomen: Soft, non-tender, non-distended with normal bowel sounds.  Musculoskeletal:  No clubbing, cyanosis or edema bilaterally.  Full range of motion without deformity.  Skin: Skin color, texture, turgor normal.  No rashes or lesions.  Neurologic:  Neurovascularly intact without any focal sensory/motor deficits. Cranial nerves: II-XII intact, grossly non-focal.  Psychiatric:  Alert and oriented, thought content  appropriate, normal insight    Consults:     IP CONSULT TO NEUROLOGY    Labs: For convenience and continuity at follow-up the following most recent labs are provided:    Lab Results   Component Value Date    WBC 4.6 06/26/2016    HGB 14.5 06/26/2016    HCT 43.1 06/26/2016    MCV 85.5 06/26/2016    PLT 235 06/26/2016    NA 140 06/26/2016    K 4.2 06/26/2016    CL 102 06/26/2016    CO2 28 06/26/2016    BUN 9 06/26/2016    CREATININE 1.0 06/26/2016    CALCIUM 8.6 06/26/2016    ALKPHOS 74 06/22/2016    ALT 13 06/22/2016    AST 14 06/22/2016    BILITOT 0.2 06/22/2016    LABALBU 3.8 06/22/2016     Lab Results   Component Value Date    INR 1.0 06/22/2016       Radiology:  MRI Brain W WO Contrast   Final Result   Mild paranasal sinus disease. Otherwise essentially unremarkable MRI   of the brain performed without and with contrast for the patient's   age.                Discharge Medications:   Discharge Medication List as of 06/26/2016 12:40 PM      START taking these medications    Details  acetaminophen (TYLENOL) 325 MG tablet Take 2 tablets by mouth every 4 hours as needed for Pain, Disp-120 tablet, R-0OTC      divalproex (DEPAKOTE ER) 500 MG extended release tablet Take 1 tablet by mouth daily, Disp-30 tablet, R-3Print           Discharge Medication List as of 06/26/2016 12:40 PM        Discharge Medication List as of 06/26/2016 12:40 PM      CONTINUE these medications which have NOT CHANGED    Details   aluminum & magnesium hydroxide-simethicone (MAALOX) 200-200-20 MG/5ML SUSP suspension Take 30 mLs by mouth as needed for IndigestionHistorical Med      loperamide (IMODIUM) 2 MG capsule Take 2 mg by mouth as needed for DiarrheaHistorical Med      gabapentin (NEURONTIN) 600 MG tablet Take 600 mg by mouth nightlyHistorical Med      Cholecalciferol (VITAMIN D3) 3000 units TABS Take by mouthHistorical Med      latanoprost (XALATAN) 0.005 % ophthalmic solution 1 drop nightlyHistorical Med      nortriptyline (PAMELOR) 50 MG  capsule Take 50 mg by mouth nightlyHistorical Med      simethicone (MYLICON) 80 MG chewable tablet Take 80 mg by mouth 4 times daily (before meals and nightly)Historical Med      busPIRone (BUSPAR) 10 MG tablet Take 10 mg by mouth 2 times dailyHistorical Med      omeprazole (PRILOSEC) 20 MG delayed release capsule Take 20 mg by mouth 2 times dailyHistorical Med      prazosin (MINIPRESS) 2 MG capsule Take 2 mg by mouth nightlyHistorical Med           Discharge Medication List as of 06/26/2016 12:40 PM          Follow-up appointments:  1 week    Provider Follow-up:    pmd    Condition at Discharge:  Stable    The patient was seen and examined on day of discharge and this discharge summary is in conjunction with any daily progress note from day of discharge.Time Spent on discharge is 45 minutes  in the examination, evaluation, counseling and review of medications and discharge plan.       Signed:    Verl Bangs, DO   06/27/2016      Thank you No primary care provider on file. for the opportunity to be involved in this patient's care. If you have any questions or concerns please feel free to contact me at [phone]

## 2016-06-26 NOTE — Progress Notes (Addendum)
Hospitalist Progress Note      PCP: No primary care provider on file.    Date of Admission: 06/23/2016    Chief Complaint: headache    Hospital Course: *pt admitted with chest pain, and migrains, thought to be malingering, workup neg, per neurology, may try depakote    Subjective: wants to go home       Medications:  Reviewed    Infusion Medications   ??? sodium chloride 50 mL/hr at 06/24/16 1804     Scheduled Medications   ??? divalproex  500 mg Oral Daily   ??? sodium chloride (PF)  10 mL Intravenous BID   ??? busPIRone  10 mg Oral BID   ??? gabapentin  600 mg Oral Nightly   ??? latanoprost  1 drop Both Eyes Nightly   ??? nortriptyline  50 mg Oral Nightly   ??? pantoprazole  40 mg Oral QAM AC   ??? prazosin  2 mg Oral Nightly   ??? simethicone  80 mg Oral 4x Daily AC & HS   ??? sodium chloride flush  10 mL Intravenous 2 times per day   ??? enoxaparin  40 mg Subcutaneous Daily   ??? metoclopramide  10 mg Intravenous Once     PRN Meds: aluminum & magnesium hydroxide-simethicone, loperamide, sodium chloride flush, acetaminophen, magnesium hydroxide, ondansetron, diphenhydrAMINE, ketorolac, metoclopramide, oxyCODONE-acetaminophen      Intake/Output Summary (Last 24 hours) at 06/26/16 1408  Last data filed at 06/25/16 2324   Gross per 24 hour   Intake              180 ml   Output                0 ml   Net              180 ml       Exam:    BP 114/70   Pulse 64   Temp 97.7 ??F (36.5 ??C)   Resp 16   Ht  (1.854 m)   Wt 206 lb (93.4 kg)   SpO2 97%   BMI 27.18 kg/m2  Gen:  Well developed  HEENT: NC/AT, moist mucous membranes, no oropharyngeal erythema or exudate  Neck: supple, trachea midline, no anterior cervical or SC LAD  Heart:  Normal s1/s2, RRR, no murmurs, gallops, or rubs.   Lungs: cta  bilaterally,  Abd: bowel sounds present, soft, nontender, nondistended, no masses  Extrem:  No clubbing, cyanosis,  No  edema  Skin: no rashes or lesions  Psych: A & O x3  Neuro: grossly intact, moves all four extremities.    Capillary Refill:  Brisk,< 3 seconds   Peripheral Pulses: +2 palpable, equal bilaterally               Labs:   Recent Labs      06/24/16   1120  06/25/16   0621  06/26/16   0720   WBC  5.3  5.4  4.6   HGB  14.4  15.1  14.5   HCT  42.8  44.5  43.1   PLT  222  239  235     Recent Labs      06/24/16   1120  06/25/16   0621  06/26/16   0720   NA  140  143  140   K  4.0  4.1  4.2   CL  104  105  102   CO2  26  26  28  BUN  9  8  9    CREATININE  1.1  1.1  1.0   CALCIUM  8.3*  8.6  8.6     No results for input(s): AST, ALT, BILIDIR, BILITOT, ALKPHOS in the last 72 hours.  No results for input(s): INR in the last 72 hours.  No results for input(s): CKTOTAL, TROPONINI in the last 72 hours.  No results for input(s): AST, ALT, ALB, BILIDIR, BILITOT, ALKPHOS in the last 72 hours.  Assessment:  Left-sided weakness -- likely secondary to complex migraine vs MS less likely  2. Chest pain, atypical (likely musculoskeletal)  3. Severe??headache (history of??migraine)  4. Urinary retention  5. History of peptic ulcer disease  6. GERD  7. PTSD  8. Personality disorder  ????          Dc home today      Electronically signed by Verl Bangs, DO on 06/26/2016 at 2:08 PM

## 2016-06-26 NOTE — Other (Addendum)
Patient Acct Nbr:  [id number]  Primary AUTH/CERT:    Primary Insurance Company Name:   [employer] Plan Name:  [address] MEDICA[address]  Primary Insurance Group Number:    Primary Insurance Plan Type: X  Primary Insurance Policy Number:  [id number]

## 2016-06-26 NOTE — Plan of Care (Signed)
Problem: Pain:  Goal: Pain level will decrease  Pain level will decrease   Outcome: Met This Shift  Goal: Control of acute pain  Control of acute pain   Outcome: Met This Shift    Problem: Falls - Risk of  Goal: Absence of falls  Outcome: Met This Shift

## 2017-04-21 DIAGNOSIS — G40909 Epilepsy, unspecified, not intractable, without status epilepticus: Principal | ICD-10-CM

## 2017-04-21 NOTE — ED Provider Notes (Signed)
ED Attending  CC: No    Department of Emergency Medicine   ED  Provider Note  Admit Date/RoomTime: 04/21/2017 11:41 PM  ED Room: 06/06   Chief Complaint      Seizures and Back Pain    History of Present Illness   Source of history provided by:  patient.  History/Exam Limitations: none.       Larry Koch is a 36 y.o. old male who has a past medical history of:   Past Medical History:   Diagnosis Date   . Anxiety    . GERD (gastroesophageal reflux disease)    . Glaucoma    . Personality disorder    . PTSD (post-traumatic stress disorder)    . PUD (peptic ulcer disease)     presents to the emergency department by private vehicle, with complaints of burning and shooting left sided neck pain with radiation to left neck base., with radiation to left arm, including deltoid area and entire arm, with radiation to left scapula. and with radiation to chest wall for several week(s) prior to arrival.  The original pain was caused by no known recent trauma. He was evaluated as an outpatient and reportedly had CT scan suggestive for lower cervical disc disease. He was referred to Chiropractor for treatment and PT. He has been receiving topical therapy and massage, but no cervical or thoracic manipulation. He was trial on Lyrica without relief. Pain has been gradually worsening. The symptoms are associated with pain and pins-and-needles type sensation changes to the left upper extremity and denies any headaches, syncope, paralysis, numbness or tingling of hands, numbness or tingling of feet, muscle weakness, involuntary movements or tremor. Does have a seizure disorder with admitted non-compliance with medication regimen due to inconsistent medication use, pain is a trigger for his seizures. Patient reportedly had a seizure while in the waiting room of the ER. Spouse is present, states that the patient typically has 3 seizures a week. The the pain is aggraveated by cervical ROM, certain LUE movements and relieved by nothing  thus far, tried OTC's and Lyrica as well as Tizanidine without relief.  There has been no history of recent trauma, injuries, falls, or strenuous activity.  He is not currently enrolled in pain management program per patient and spouse. [facility] shows last Lyrica script ended 03/28/2017, limited Norco script from local dentist. Denies any associated fever or chills. Denies shortness of breath, cough with phlegm or hemoptysis. Denies abdominal pains, nausea, vomiting, change in bowel patterns, hematochezia or melena. Denies dysuria, hematuria, suprapubic or flank pains.     ROS    Pertinent positives and negatives are stated within HPI, all other systems reviewed and are negative.  Past Surgical History:   Procedure Laterality Date   . TONSILLECTOMY     Social History:  reports that he has never smoked. He does not have any smokeless tobacco history on file. He reports that he drinks alcohol. He reports that he does not use drugs.  Family History: family history is not on file.   Allergies: Other and Penicillins    Physical Exam            ED Triage Vitals [04/21/17 2345]   BP Temp Temp Source Pulse Resp SpO2 Height Weight   (!) 151/86 98.2 F (36.8 C) Oral 74 16 95 % 6\' 2"  (1.88 m) 226 lb (102.5 kg)       Oxygen Saturation Interpretation: Normal.    Constitutional:  Alert.  Uncomfortable, non-toxic and  without distress  Head:  Atraumatic, without temporal or scalp tenderness.  Eyes:  PERRL, EOMI.  No periorbital ecchymoses. Sclera without icterus. Conjunctiva: normal.  Ears:  External ears without lesions.  Throat:  Pharynx without lesions.  Airway patient.  Neck:  In collar: No.          Bony tenderness:  none.         Paraspinal tenderness:  mid and lower to the left.        Trapezius Tenderness:  moderate to the left.         Crepitance: none.         Step off: No.        Tracheal deviation: none.          JVD: none.         ROM: diminished range with pain. + Spurlings       Skin:  no erythema, rash or wounds  noted.  Chest:  Symmetrical without visible rash or tenderness.  Respiratory:  Clear to auscultation and breath sounds equal. Air entry is normal.  CV:  Regular rate and rhythm, normal heart sounds, without pathological murmurs.  GI:  Soft, nontender.  No abrasions, ecchymoses, or lacerations.  Back:  No costovertebral, paravertebral, intervertebral, or vertebral tenderness or spasm.  Pelvis: non tender and stable to palpation.  Integument:  No abrasions, ecchymoses, or lacerations unless noted elsewhere.  Musculoskeletal  No tenderness or swelling.  Normal, painless range of motion of extremities.  No neurovascular deficit.  Lymphatics: No lymphangitis or adenopathy noted.  Neurological:  Orientation age-appropriate.  Motor functions intact.    Lab / Imaging Results   (All laboratory and radiology results have been personally reviewed by myself)  Labs:  Results for orders placed or performed during the hospital encounter of 04/21/17   CBC auto differential   Result Value Ref Range    WBC 8.2 4.5 - 11.5 E9/L    RBC 5.77 3.80 - 5.80 E12/L    Hemoglobin 17.4 (H) 12.5 - 16.5 g/dL    Hematocrit 16.149.4 09.637.0 - 54.0 %    MCV 85.6 80.0 - 99.9 fL    MCH 30.2 26.0 - 35.0 pg    MCHC 35.2 (H) 32.0 - 34.5 %    RDW 12.3 11.5 - 15.0 fL    Platelets 245 130 - 450 E9/L    MPV 10.3 7.0 - 12.0 fL    Neutrophils % 56.2 43.0 - 80.0 %    Immature Granulocytes % 0.5 0.0 - 5.0 %    Lymphocytes % 31.3 20.0 - 42.0 %    Monocytes % 7.8 2.0 - 12.0 %    Eosinophils % 3.2 0.0 - 6.0 %    Basophils % 1.0 0.0 - 2.0 %    Neutrophils # 4.59 1.80 - 7.30 E9/L    Immature Granulocytes # 0.04 E9/L    Lymphocytes # 2.55 1.50 - 4.00 E9/L    Monocytes # 0.64 0.10 - 0.95 E9/L    Eosinophils # 0.26 0.05 - 0.50 E9/L    Basophils # 0.08 0.00 - 0.20 E9/L   Basic Metabolic Panel   Result Value Ref Range    Sodium 140 132 - 146 mmol/L    Potassium 4.3 3.5 - 5.0 mmol/L    Chloride 105 98 - 107 mmol/L    CO2 23 22 - 29 mmol/L    Anion Gap 12 7 - 16 mmol/L    Glucose  125 (H) 74 - 109  mg/dL    BUN 11 6 - 20 mg/dL    CREATININE 0.8 0.7 - 1.2 mg/dL    GFR Non-African American >60 >=60 mL/min/1.73    GFR African American >60     Calcium 8.2 (L) 8.6 - 10.2 mg/dL   Troponin   Result Value Ref Range    Troponin <0.01 0.00 - 0.03 ng/mL   EKG:   Interpreted by myself with attending physician unless otherwise noted.  Time:  00:04:46    Interpretation:  Rhythm: Normal Sinus Rhythm  Rate: 69  Intervals: within defined limits  Axis: normal  ST/T waves: Nonspecific early repolarization changes noted, no acute findings  R wave progression: Normal  Comparison: stable as compared to patient's most recent EKG.    Imaging:  All Radiology results interpreted by Radiologist unless otherwise noted.  No orders to display     ED Course / Medical Decision Making     Medications   orphenadrine (NORFLEX) injection 60 mg (60 mg Intramuscular Given 04/22/17 0118)   dexamethasone (PF) (DECADRON) injection 5 mg (5 mg Intravenous Given 04/22/17 0119)   ketorolac (TORADOL) injection 15 mg (15 mg Intravenous Given 04/22/17 0118)   nalbuphine (NUBAIN) injection 5 mg (5 mg Intravenous Given 04/22/17 0243)        Reevaluation  Time: 02:35am  Patient's pain improved, still requiring additional medication. Nubain ordered.    Time: 03:40am  Patient seen and reexamined the bedside. Patient's symptoms are improving. Pain is nearly resolved. Results are explained to patient in plain language, treatment plan reviewed, patient is agreeable with plan for discharge at this point. Understands that will need to call to arrange follow-up.      Consults:   None    Procedure(s):   none    Medical Decision Making:    3 -year-old male patient presenting to the emergency department for evaluation of left neck, shoulder and upper extremity pain that for several weeks. He is being followed in the outpatient setting by his [place] physician and has recently been referred to chiropractor for PT, massage and topical therapy. He is not receiving  manipulation at this time. States his symptoms have been gradually worsening. He hasn't been trialed on several medications over-the-counter and prescription, most recently Lyrica, without relief. He has run out of his Zanaflex recently as well which she has been on for several months, which he feels may be worsening his symptoms. He has follow[facility] to discuss medication and further evaluation due to worsening symptoms and lack of response to conservative medical management. Patient has a known seizure disorder and is very poor compliance with medication regimen, due to this he has seizures as frequently as 3 times per week. They describe pain as a known trigger for his seizures and he reportedly had a seizure while waiting in the waiting room of the ER. He was no longer actively seizing or postictal on my evaluation. He was complaining of pain as mentioned in the left neck that radiated to the left shoulder, anterior and posterior chest and upper extremity. There is no deficit associated with this and he was neurovascularly intact. No midline cervical spine tenderness, no recent trauma or injury. No infectious findings as he was afebrile and nontoxic and no meningeal signs are noted. It have a positive Spurling maneuver consistent with a radicular source of pain. He was given Decadron, Toradol and Norflex with moderate symptom relief. On reevaluation there was persistent but significantly improved  pain he was given half dose of Nubain with good relief. On reexamination he was resting comfortably and awoken to discuss discharge. He is advised to take steroid taper and Norflex as prescribed, use caution with Norflex and that may cause drowsiness or sedation. No driving due to potentially sedating medications and due to the seizure condition until cleared by his physician. Return to the ER with any new, changing, worsening symptoms or concerns including but not limited to loss or  change in sensation to the left upper extremity. Otherwise, PCP follow-up for further treatment and potential referral for neurosurgical opinion or MRI.    Heart Score-  History     Slightly suspicious (0) yes     Moderately suspicious (1)      Highly Suspicious (2)   ECG     Significant ST deviation (2)      Non-specific repolarization disturbance/left bundle-branch block/PM (1) yes     Normal (0)   Age     Less than 28 years of age (0) yes    Older than 20 but younger than 73 (1)      Older than 44 (2)   Risk Factors     No known risk factors (0)yes     1 or 2 risk factors (1)      Greater than 3 risk factors or history of atherosclerotic disease (2)   Troponin     Less than or equal to 1 times normal limits (0) yes     Greater than 1 times normal limits less than 3 times (1)      Greater than or equal to 3 times normal limits  (2)   Total Heart Score= 1      Assessment     1. Seizure disorder with inconsistent medication complaince(HCC)    2. Posterior neck pain    3. Radiculitis of left cervical region      Plan   Discharge to home  Patient condition is good    New Medications     Discharge Medication List as of 04/22/2017  4:05 AM      START taking these medications    Details   predniSONE (DELTASONE) 10 MG tablet Take by oral route 4 tabs x 3 days, then 3 tabs x 3 days, then 2 tabs x 3 days, then 1 tab x 3 days, then discontinue. Take with food., Disp-30 tablet, R-0Print      orphenadrine (NORFLEX) 100 MG extended release tablet Take 1 tablet by mouth 2 times daily for 7 days Use caution as may cause drowsiness or sedation. No alcohol or driving while taking., [id number] tablet, R-0Print           Electronically signed by Elberta Fortis, APRN - CNP   DD: 04/22/17  **This report was transcribed using voice recognition software. Every effort was made to ensure accuracy; however, inadvertent computerized transcription errors may be present.  END OF ED PROVIDER NOTE          Elberta Fortis, APRN - CNP  04/22/17  [phone]      ATTENDING PROVIDER ATTESTATION:     I have personally performed and/or participated in the history, exam, medical decision making, and procedures and agree with all pertinent clinical information.      I have also reviewed and agree with the past medical, family and social history unless otherwise noted.    My findings/Plan: Breakthrough seizure. Neck pain with radiculopathy. Heart RRR. Lungs CTA bilaterally. Abdomen soft, nontender. Bowel  sounds normal. Will discharge for outpatient follow up.         Ladon Applebaum, DO  04/22/17 [phone]

## 2017-04-22 ENCOUNTER — Inpatient Hospital Stay
Admit: 2017-04-22 | Discharge: 2017-04-22 | Disposition: A | Discharge status: Home / Self Care | Payer: PRIVATE HEALTH INSURANCE | Attending: Emergency Medicine

## 2017-04-22 LAB — CBC WITH AUTO DIFFERENTIAL
Basophils %: 1 % (ref 0.0–2.0)
Basophils Absolute: 0.08 E9/L (ref 0.00–0.20)
Eosinophils %: 3.2 % (ref 0.0–6.0)
Eosinophils Absolute: 0.26 E9/L (ref 0.05–0.50)
Hematocrit: 49.4 % (ref 37.0–54.0)
Hemoglobin: 17.4 g/dL — ABNORMAL HIGH (ref 12.5–16.5)
Immature Granulocytes #: 0.04 E9/L
Immature Granulocytes %: 0.5 % (ref 0.0–5.0)
Lymphocytes %: 31.3 % (ref 20.0–42.0)
Lymphocytes Absolute: 2.55 E9/L (ref 1.50–4.00)
MCH: 30.2 pg (ref 26.0–35.0)
MCHC: 35.2 % — ABNORMAL HIGH (ref 32.0–34.5)
MCV: 85.6 fL (ref 80.0–99.9)
MPV: 10.3 fL (ref 7.0–12.0)
Monocytes %: 7.8 % (ref 2.0–12.0)
Monocytes Absolute: 0.64 E9/L (ref 0.10–0.95)
Neutrophils %: 56.2 % (ref 43.0–80.0)
Neutrophils Absolute: 4.59 E9/L (ref 1.80–7.30)
Platelets: 245 E9/L (ref 130–450)
RBC: 5.77 E12/L (ref 3.80–5.80)
RDW: 12.3 fL (ref 11.5–15.0)
WBC: 8.2 E9/L (ref 4.5–11.5)

## 2017-04-22 LAB — BASIC METABOLIC PANEL
Anion Gap: 12 mmol/L (ref 7–16)
BUN: 11 mg/dL (ref 6–20)
CO2: 23 mmol/L (ref 22–29)
Calcium: 8.2 mg/dL — ABNORMAL LOW (ref 8.6–10.2)
Chloride: 105 mmol/L (ref 98–107)
Creatinine: 0.8 mg/dL (ref 0.7–1.2)
GFR African American: >60
GFR Non-African American: >60 mL/min/{1.73_m2} (ref >=60)
Glucose: 125 mg/dL — ABNORMAL HIGH (ref 74–109)
Potassium: 4.3 mmol/L (ref 3.5–5.0)
Sodium: 140 mmol/L (ref 132–146)

## 2017-04-22 LAB — TROPONIN: Troponin: <0.01 ng/mL (ref 0.00–0.03)

## 2017-04-22 MED ORDER — KETOROLAC TROMETHAMINE 30 MG/ML IJ SOLN
30 MG/ML | Freq: Once | INTRAMUSCULAR | Status: DC
Start: 2017-04-22 — End: 2017-04-22

## 2017-04-22 MED ORDER — DEXAMETHASONE SOD PHOSPHATE PF 10 MG/ML IJ SOLN
10 MG/ML | Freq: Once | INTRAMUSCULAR | Status: DC
Start: 2017-04-22 — End: 2017-04-22

## 2017-04-22 MED ORDER — ORPHENADRINE CITRATE 30 MG/ML IJ SOLN
30 MG/ML | Freq: Once | INTRAMUSCULAR | Status: AC
Start: 2017-04-22 — End: 2017-04-22
  Administered 2017-04-22: 05:00:00 60 mg via INTRAMUSCULAR

## 2017-04-22 MED ORDER — PREDNISONE 10 MG PO TABS
10 MG | ORAL_TABLET | ORAL | 0 refills | Status: DC
Start: 2017-04-22 — End: 2020-10-09

## 2017-04-22 MED ORDER — KETOROLAC TROMETHAMINE 15 MG/ML IJ SOLN
15 MG/ML | Freq: Once | INTRAMUSCULAR | Status: AC
Start: 2017-04-22 — End: 2017-04-22
  Administered 2017-04-22: 05:00:00 15 mg via INTRAVENOUS

## 2017-04-22 MED ORDER — DEXAMETHASONE SOD PHOSPHATE PF 10 MG/ML IJ SOLN
10 MG/ML | Freq: Once | INTRAMUSCULAR | Status: AC
Start: 2017-04-22 — End: 2017-04-22
  Administered 2017-04-22: 05:00:00 5 mg via INTRAVENOUS

## 2017-04-22 MED ORDER — ORPHENADRINE CITRATE ER 100 MG PO TB12
100 MG | ORAL_TABLET | Freq: Two times a day (BID) | ORAL | 0 refills | Status: AC
Start: 2017-04-22 — End: 2017-04-29

## 2017-04-22 MED ORDER — NALBUPHINE HCL 20 MG/ML IJ SOLN
20 MG/ML | Freq: Once | INTRAMUSCULAR | Status: AC
Start: 2017-04-22 — End: 2017-04-22
  Administered 2017-04-22: 07:00:00 5 mg via INTRAVENOUS

## 2017-04-22 MED FILL — DEXAMETHASONE SOD PHOSPHATE PF 10 MG/ML IJ SOLN: 10 MG/ML | INTRAMUSCULAR | Qty: 1

## 2017-04-22 MED FILL — KETOROLAC TROMETHAMINE 15 MG/ML IJ SOLN: 15 MG/ML | INTRAMUSCULAR | Qty: 1

## 2017-04-22 MED FILL — ORPHENADRINE CITRATE 30 MG/ML IJ SOLN: 30 MG/ML | INTRAMUSCULAR | Qty: 2

## 2017-04-22 MED FILL — NALBUPHINE HCL 20 MG/ML IJ SOLN: 20 MG/ML | INTRAMUSCULAR | Qty: 1

## 2017-04-22 NOTE — ED Notes (Addendum)
[facility]. Pt presents to ED with c/o neck pain. Pt reports hx of herniated discs. Pt reports increased pain today. Pt denies any further injury/trauma/fall. Pt denies numbness/tingling/weakness in any extremity. While in waiting room, pt had a witnessed seizure. Pt has hx of seizures. Pt did not fall/hit head. Pt is currently A&O x 3 with family at bedside. Pt placed on seizure precautions. Pt placed on cardiac monitor and continuous pulse oximetry. Call light within reach. Bed In lowest position. Both side-rails up. NAD noted. Will continue to monitor.        Oneita KrasStephanie N O'Connor, RN  04/22/17 0112       Oneita KrasStephanie N O'Connor, RN  04/22/17 [phone]

## 2017-04-22 NOTE — Discharge Instructions (Signed)
No driving due to seizures/seizure disorder unless cleared by your physician. Prednisone with food. Tylenol for mild-to-moderate pain. Orphenadrine for spasm, however use caution as may cause drowsiness or sedation. Do not drive or drink alcohol while taking. Any increased pain, worsening symptoms or concerns including numbness, tingling, sensation loss to the arms or legs, genitals, rectum, or loss of bowel or bladder control should return to the ER for evaluation. Otherwise follow-up with your PCP in 5 days for recheck.

## 2017-04-22 NOTE — ED Notes (Signed)
Discharge, medications, and follow-up instructions reviewed with patient. Pt questions/concerns answered. Pt verbalized understanding.        Oneita KrasStephanie N O'Connor, RN  04/22/17 [phone]

## 2017-04-22 NOTE — ED Notes (Signed)
Pt medicated per order. NAD noted. Will continue to monitor.        Oneita KrasStephanie N O'Connor, RN  04/22/17 [phone]

## 2017-04-22 NOTE — ED Notes (Signed)
Pt medicated per order. NAD noted. Will continue to monitor.        Oneita KrasStephanie N O'Connor, RN  04/22/17 [phone]

## 2017-06-11 ENCOUNTER — Inpatient Hospital Stay
Admit: 2017-06-11 | Discharge: 2017-06-12 | Disposition: A | Discharge status: Home / Self Care | Payer: PRIVATE HEALTH INSURANCE

## 2017-06-11 ENCOUNTER — Emergency Department: Admit: 2017-06-12 | Payer: PRIVATE HEALTH INSURANCE

## 2017-06-11 DIAGNOSIS — N50811 Right testicular pain: Secondary | ICD-10-CM

## 2017-06-11 NOTE — ED Notes (Signed)
Discharge, medication, and follow-up instructions reviewed with patient. Pt questions/concerns answered. Pt verbalized understanding.        Oneita KrasStephanie N O'Connor, RN  06/11/17 2141

## 2017-06-11 NOTE — ED Notes (Signed)
PT to US      Pitney BowesEmmaleigh Larrison, RN  06/11/17 2037

## 2017-06-11 NOTE — ED Provider Notes (Signed)
[facility]  ??  Department of Emergency Medicine   ED  Provider Note  Admit Date/RoomTime: 06/11/2017  8:00 PM  ED Room: 16/16  Chief Complaint     Testicle Pain (right testicle, edema per pt)    History of Present Illness   Source of history provided by:  patient and spouse/SO.  History/Exam Limitations: none.      Larry Koch is a 36 y.o. old male with a past medical history of:   Past Medical History:   Diagnosis Date   ??? Anxiety    ??? Cerebral artery occlusion with cerebral infarction (HCC) 2017   ??? GERD (gastroesophageal reflux disease)    ??? Glaucoma    ??? Personality disorder    ??? PTSD (post-traumatic stress disorder)    ??? PUD (peptic ulcer disease)    ??? Seizures (HCC)     presenting to the emergency department by private vehicle, for Right scrotal pain , which occured 1 month(s) prior to arrival.  Since exposure/onset the symptoms have been worsening and moderate in severity. He has associated symptoms of Dysuria, urinary frequency, and urinary urgency.  He denies any abdominal pain, back pain, chills, cloudy urine, constipation, headache, hematuria, penile discharge, sweating, vomiting or fever.  There has been no history of similar episodes of pain.  There has been no history of recent trauma.  STD history: none.     ROS    Pertinent positives and negatives are stated within HPI, all other systems reviewed and are negative.    Past Surgical History:   Procedure Laterality Date   ??? TONSILLECTOMY     Social History:  reports that he has never smoked. He does not have any smokeless tobacco history on file. He reports that he drinks alcohol. He reports that he does not use drugs.  Family History: family history is not on file.   Allergies: Other and Penicillins    Physical Exam           ED Triage Vitals   BP Temp Temp Source Pulse Resp SpO2 Height Weight   06/11/17 1951 06/11/17 1951 06/11/17 1951 06/11/17 1951 06/11/17 1951 06/11/17 1951 06/11/17 1952 06/11/17 1952   126/82 97.4 ??F (36.3 ??C) Oral 66 18  94 % 6\' 1"  (1.854 m) 233 lb (105.7 kg)      Oxygen Saturation Interpretation: Normal.    Constitutional:  Alert, development consistent with age.  Eyes:  PERRL, EOMI, no discharge or conjunctival injection.  Ears:  External ears without lesions.  Throat:  Pharynx without injection, exudate, or tonsillar hypertrophy.  Airway patient.  Neck:  Normal ROM.  Supple.  Respiratory:  Clear to auscultation and breath sounds equal.  CV:  Regular rate and rhythm, normal heart sounds, without pathological murmurs, ectopy, gallops, or rubs.  GI:  General Appearance: normal.       Bowel sounds: normal bowel sounds.         Distension:  None.          Tenderness: No abdominal tenderness.         Liver: non-tender.                         Spleen:  non-palpable and non-tender.       Pulsatile Mass: absent.         Hernia:  no inguinal or femoral hernias noted.  GU:        Penis: non focal circumcised.  Scrotum: normal.         Testicle: swelling on right, tenderness of right.  Integument:  Normal turgor.  Warm, dry, without visible rash, unless noted elsewhere.  Neurological:  Orientation age-appropriate.  Motor functions intact.    Lab / Imaging Results   (All laboratory and radiology results have been personally reviewed by myself)  Labs:  Results for orders placed or performed during the hospital encounter of 06/11/17   Urinalysis   Result Value Ref Range    Color, UA Yellow Straw/Yellow    Clarity, UA Clear Clear    Glucose, Ur Negative Negative mg/dL    Bilirubin Urine Negative Negative    Ketones, Urine Negative Negative mg/dL    Specific Gravity, UA 1.015 1.005 - 1.030    Blood, Urine Negative Negative    pH, UA 6.0 5.0 - 9.0    Protein, UA Negative Negative mg/dL    Urobilinogen, Urine 0.2 <2.0 E.U./dL    Nitrite, Urine Negative Negative    Leukocyte Esterase, Urine Negative Negative     Imaging:  All Radiology results interpreted by Radiologist unless otherwise noted.  US SCROTUM AND TESTICLES   Final Result   1.  Bilateral testes are normal in morphology without evidence of   torsion.   2. Small right hydrocele.   2. Questionable fat-containing right inguinal hernia.              ED Course / Medical Decision Making     Medications   HYDROcodone-acetaminophen (NORCO) 5-325 MG per tablet 1 tablet (1 tablet Oral Given 06/11/17 2036)        Re-examination:  06/11/17       Time: 2115    Patient???s symptoms are improving.    Consults:   None    Procedures:   none    MDM:   Patient presented with ongoing right testicle pain.  Clinical exam was benign.  Diagnostics were reviewed and discussed with him.  Infection is not suspected at this time.  Ultrasound was negative for torsion or other emergent pathology.  Patient is prescribed pain medication instructed to follow up with urology.  He is instructed to return to the emergency department with any new or worsening symptoms.    Counseling:   The emergency provider has spoken with the patient and spouse/SO and discussed today???s results, in addition to providing specific details for the plan of care and counseling regarding the diagnosis and prognosis.  Questions are answered at this time and they are agreeable with the plan.    Assessment     1. Testicular pain, right      Plan   Discharge to home  Patient condition is good    New Medications     Discharge Medication List as of 06/11/2017  9:34 PM      START taking these medications    Details   HYDROcodone-acetaminophen (NORCO) 5-325 MG per tablet Take 1 tablet by mouth every 6 hours as needed for Pain for up to 3 days.., Disp-12 tablet, R-0Print           Electronically signed by Silas Sacramento, APRN - CNP   DD: 06/11/17  **This report was transcribed using voice recognition software. Every effort was made to ensure accuracy; however, inadvertent computerized transcription errors may be present.  END OF ED PROVIDER NOTE     Casandra Doffing, APRN - CNP  06/11/17 2147

## 2017-06-12 LAB — URINALYSIS
Bilirubin Urine: NEGATIVE
Blood, Urine: NEGATIVE
Glucose, Ur: NEGATIVE mg/dL
Ketones, Urine: NEGATIVE mg/dL
Leukocyte Esterase, Urine: NEGATIVE
Nitrite, Urine: NEGATIVE
Protein, UA: NEGATIVE mg/dL
Specific Gravity, UA: 1.015 (ref 1.005–1.030)
Urobilinogen, Urine: 0.2 E.U./dL (ref <2.0)
pH, UA: 6 (ref 5.0–9.0)

## 2017-06-12 MED ORDER — HYDROCODONE-ACETAMINOPHEN 5-325 MG PO TABS
5-325 MG | Freq: Once | ORAL | Status: AC
Start: 2017-06-12 — End: 2017-06-11
  Administered 2017-06-12: 01:00:00 1 via ORAL

## 2017-06-12 MED ORDER — HYDROCODONE-ACETAMINOPHEN 5-325 MG PO TABS
5-325 MG | ORAL_TABLET | Freq: Four times a day (QID) | ORAL | 0 refills | Status: AC | PRN
Start: 2017-06-12 — End: 2017-06-14

## 2017-06-12 MED FILL — HYDROCODONE-ACETAMINOPHEN 5-325 MG PO TABS: 5-325 MG | ORAL | Qty: 1

## 2017-06-14 LAB — CULTURE, URINE

## 2017-12-23 ENCOUNTER — Emergency Department (HOSPITAL_COMMUNITY)
Admission: EM | Admit: 2017-12-23 | Discharge: 2017-12-23 | Disposition: A | Discharge status: Home / Self Care | Payer: PRIVATE HEALTH INSURANCE | Attending: Emergency Medicine | Admitting: Emergency Medicine

## 2017-12-23 ENCOUNTER — Emergency Department (HOSPITAL_COMMUNITY): Payer: PRIVATE HEALTH INSURANCE

## 2017-12-23 ENCOUNTER — Encounter (HOSPITAL_COMMUNITY): Payer: Self-pay | Admitting: Emergency Medicine

## 2017-12-23 ENCOUNTER — Other Ambulatory Visit: Payer: Self-pay

## 2017-12-23 DIAGNOSIS — J40 Bronchitis, not specified as acute or chronic: Secondary | ICD-10-CM | POA: Insufficient documentation

## 2017-12-23 DIAGNOSIS — R079 Chest pain, unspecified: Secondary | ICD-10-CM | POA: Diagnosis present

## 2017-12-23 HISTORY — DX: Cerebral infarction, unspecified: I63.9

## 2017-12-23 HISTORY — DX: Migraine, unspecified, not intractable, without status migrainosus: G43.909

## 2017-12-23 LAB — CBC
HCT: 49.8 % (ref 39.0–52.0)
Hemoglobin: 17.6 g/dL — ABNORMAL HIGH (ref 13.0–17.0)
MCH: 30.4 pg (ref 26.0–34.0)
MCHC: 35.3 g/dL (ref 30.0–36.0)
MCV: 86.2 fL (ref 78.0–100.0)
PLATELETS: 261 10*3/uL (ref 150–400)
RBC: 5.78 MIL/uL (ref 4.22–5.81)
RDW: 12.3 % (ref 11.5–15.5)
WBC: 7.8 10*3/uL (ref 4.0–10.5)

## 2017-12-23 LAB — BASIC METABOLIC PANEL
Anion gap: 10 (ref 5–15)
BUN: 6 mg/dL (ref 6–20)
CALCIUM: 8.6 mg/dL — AB (ref 8.9–10.3)
CO2: 24 mmol/L (ref 22–32)
Chloride: 105 mmol/L (ref 101–111)
Creatinine, Ser: 0.93 mg/dL (ref 0.61–1.24)
GFR calc Af Amer: >60 mL/min (ref >60)
GLUCOSE: 103 mg/dL — AB (ref 65–99)
Potassium: 3.9 mmol/L (ref 3.5–5.1)
SODIUM: 139 mmol/L (ref 135–145)

## 2017-12-23 LAB — I-STAT TROPONIN, ED: TROPONIN I, POC: 0 ng/mL (ref 0.00–0.08)

## 2017-12-23 MED ORDER — BENZONATATE 100 MG PO CAPS: 100.0000 mg | ORAL_CAPSULE | Freq: Three times a day (TID) | ORAL | 0 refills | Status: DC

## 2017-12-23 MED ORDER — AZITHROMYCIN 250 MG PO TABS: 250.0000 mg | ORAL_TABLET | Freq: Every day | ORAL | 0 refills | Status: AC

## 2017-12-23 MED FILL — BENZONATATE 100 MG CAPS: 100 | 7 days supply | Qty: 21 | Fill #0

## 2017-12-23 MED FILL — AZITHROMYCIN 250 MG TABLET: 250 | 5 days supply | Qty: 6 | Fill #0

## 2017-12-23 NOTE — ED Provider Notes (Signed)
[facility] Note   CSN: [id number] Arrival date & time: 12/23/17  0533     History   Chief Complaint Chief Complaint  Patient presents with  . Chest Pain  . Cough    HPI Lurline HareCharles Mccammon is a 37 y.o. male.  The history is provided by the patient. No language interpreter was used.  Cough  This is a new problem. The problem occurs constantly. The cough is productive of sputum. There has been no fever. Associated symptoms include headaches. He has tried nothing for the symptoms. The treatment provided no relief. He is not a smoker. His past medical history does not include asthma.  Pt reports he has had a cough for a week.  Pt reports his chest is sore from coughing. Pt denies any asthma   Past Medical History:  Diagnosis Date  . Migraines   . Stroke [facility](HCC)    2017    There are no active problems to display for this patient.   Past Surgical History:  Procedure Laterality Date  . TONSILLECTOMY         Home Medications    Prior to Admission medications   Not on File    Family History No family history on file.  Social History Social History   Tobacco Use  . Smoking status: Never Smoker  . Smokeless tobacco: Never Used  Substance Use Topics  . Alcohol use: No    Frequency: Never  . Drug use: No     Allergies   Penicillins   Review of Systems Review of Systems  Respiratory: Positive for cough.   Neurological: Positive for headaches.  All other systems reviewed and are negative.    Physical Exam Updated Vital Signs BP 132/90 (BP Location: Right Arm)   Pulse 83   Temp 98.9 F (37.2 C) (Oral)   Resp 16   Ht 6' (1.829 m)   Wt 105.2 kg (232 lb)   SpO2 96%   BMI 31.46 kg/m   Physical Exam  Constitutional: He appears well-developed and well-nourished.  HENT:  Head: Normocephalic and atraumatic.  Eyes: Conjunctivae are normal. Pupils are equal, round, and reactive to light.  Neck: Normal  range of motion. Neck supple.  Cardiovascular: Normal rate, regular rhythm and normal pulses.  No murmur heard. Pulmonary/Chest: Effort normal and breath sounds normal. No respiratory distress.  Abdominal: Soft. There is no tenderness.  Musculoskeletal: Normal range of motion. He exhibits no edema.  Neurological: He is alert.  Skin: Skin is warm and dry.  Psychiatric: He has a normal mood and affect.  Nursing note and vitals reviewed.    ED Treatments / Results  Labs (all labs ordered are listed, but only abnormal results are displayed) Labs Reviewed  BASIC METABOLIC PANEL - Abnormal; Notable for the following components:      Result Value   Glucose, Bld 103 (*)    Calcium 8.6 (*)    All other components within normal limits  CBC - Abnormal; Notable for the following components:   Hemoglobin 17.6 (*)    All other components within normal limits  I-STAT TROPONIN, ED    EKG  EKG Interpretation  Date/Time:  Tuesday December 23 2017 05:40:51 EST Ventricular Rate:  78 PR Interval:  152 QRS Duration: 86 QT Interval:  366 QTC Calculation: 417 R Axis:   99 Text Interpretation:  Normal sinus rhythm Rightward axis Cannot rule out Anterior infarct , age undetermined Abnormal ECG No previous ECGs  available Confirmed by Alvira Monday ([id number]) on 12/23/2017 6:56:16 AM       Radiology Dg Chest 2 View  Result Date: 12/23/2017 CLINICAL DATA:  Cough and cold symptoms since last Friday. EXAM: CHEST  2 VIEW COMPARISON:  None. FINDINGS: Shallow inspiration with linear atelectasis in the lung bases. Heart size and pulmonary vascularity are normal. No edema or consolidation. No blunting of costophrenic angles. No pneumothorax. Mediastinal contours appear intact. IMPRESSION: Shallow inspiration with atelectasis in the lung bases. Electronically Signed   By: Burman Nieves M.D.   On: 12/23/2017 06:02    Procedures Procedures (including critical care time)  Medications Ordered in  ED Medications - No data to display   Initial Impression / Assessment and Plan / ED Course  I have reviewed the triage vital signs and the nursing notes.  Pertinent labs & imaging results that were available during my care of the patient were reviewed by me and considered in my medical decision making (see chart for details).  Clinical Course as of Dec 23 905  Tue Dec 23, 2017  0858 ED EKG within 10 minutes [LS]    Clinical Course User Index [LS] Elson Areas, PA-C    MDM  Pt has had cough for over a week.  Pt coughing up phelgm and has soreness with coughing.  No influenza exposure.  I will treat for bronchitis.  Pt is schedule to see his MD at [facility] in February.  He is to return if any problems.  Final Clinical Impressions(s) / ED Diagnoses   Final diagnoses:  Bronchitis    ED Discharge Orders        Ordered    azithromycin (ZITHROMAX) 250 MG tablet  Daily     12/23/17 0912    benzonatate (TESSALON) 100 MG capsule  Every 8 hours     12/23/17 0912    An After Visit Summary was printed and given to the patient.    Elson Areas, [place] 12/23/17 6045    Alvira Monday, MD 12/23/17 2235

## 2017-12-23 NOTE — Discharge Instructions (Signed)
Follow up with your Physician as scheduled.  Return if any problems.  

## 2017-12-23 NOTE — ED Triage Notes (Signed)
Pt c/o non-radiating chest pain, chills and cough x1 week. Denies nausea/vomiting/diaphoresis.

## 2018-08-19 ENCOUNTER — Telehealth: Payer: Self-pay | Admitting: Gastroenterology

## 2018-08-19 NOTE — Telephone Encounter (Signed)
Even after reviewing the records, it is not entirely clear to me if he needs only an EGD or other testing as well.  It is also not clear why he is being sent out of the [place] system for this.  I would like to help if I can.  Please make this patient an appointment to see me in clinic next available for new patient.   I also need a phone number to try and reach Grafton Folk, PA who appears to work in the GI clinic at either the [employer] or the [place] in [place], [place].

## 2018-08-24 NOTE — Telephone Encounter (Signed)
Spoke with patient who states [facility] will [facility] an [facility] and a procedure. Patient states the [facility] out sources these procedures so that's why he needs it done somewhere else but not an ov. Patient will cb to close referral or schedule.

## 2018-08-26 ENCOUNTER — Telehealth: Payer: Self-pay | Admitting: Gastroenterology

## 2018-08-26 NOTE — Telephone Encounter (Signed)
I see that patient was contacted and plans to communicate with [facility] about his referral.  I tried multiple times to reach Grafton Folk, [place] in the [place] at [phone] (the number I was given), but nobody would pick up the phone.  I am therefore unable to discuss this case with the referring provider, and I still feel this should be an office visit so I can determine for myself what appropriate testing should be.  I am forwarding this to you because you had called with the patient, but you do not need to call him again.  The referral can be closed out, reopen as needed if referring provider contacts [place] and is available to discuss his patient with me.

## 2018-09-18 NOTE — Telephone Encounter (Signed)
Noted. Referral closed.

## 2018-09-29 ENCOUNTER — Encounter: Payer: Self-pay | Admitting: Gastroenterology

## 2018-09-29 NOTE — Telephone Encounter (Signed)
Pt scheduled on 10/30/18 for consult visit.

## 2018-10-30 ENCOUNTER — Ambulatory Visit: Payer: PRIVATE HEALTH INSURANCE | Admitting: Gastroenterology

## 2018-12-04 ENCOUNTER — Ambulatory Visit: Payer: PRIVATE HEALTH INSURANCE | Admitting: Gastroenterology

## 2020-09-23 ENCOUNTER — Emergency Department: Admit: 2020-09-24 | Payer: PRIVATE HEALTH INSURANCE

## 2020-09-23 DIAGNOSIS — S0181XA Laceration without foreign body of other part of head, initial encounter: Secondary | ICD-10-CM

## 2020-09-23 NOTE — ED Provider Notes (Signed)
eMERGENCY dEPARTMENT eNCOUnter      Pt Name: Larry Koch  MRN: [id number]  Birthdate 1980/12/17  Date of evaluation: 09/23/2020      CHIEF COMPLAINT       Chief Complaint   Patient presents with   ??? Head Injury         HISTORY OF PRESENT ILLNESS    Larry Koch is a 39 y.o. male who presents with head injury.  Patient states shortly before arrival he was trying to change the bed and hit his head on a shelf says he saw stars things went black for he thinks which is a brief amount of time he complains of a headache to the area has a superficial laceration to the forehead with no active bleeding at this time he states his last tetanus immunization was less than 10 years he states he is concerned because he has a history of seizures of though is not take any medicines for it at and he was concerned about the injury        REVIEW OF SYSTEMS       Review of systems are all reviewed and negative except stated above in HPI    PAST MEDICAL HISTORY    has a past medical history of Anxiety, Cerebral artery occlusion with cerebral infarction (HCC), GERD (gastroesophageal reflux disease), Glaucoma, Personality disorder (HCC), PTSD (post-traumatic stress disorder), PUD (peptic ulcer disease), and Seizures (HCC).    SURGICAL HISTORY      has a past surgical history that includes Tonsillectomy.    CURRENT MEDICATIONS       Previous Medications    ACETAMINOPHEN (TYLENOL) 325 MG TABLET    Take 2 tablets by mouth every 4 hours as needed for Pain    ALUMINUM & MAGNESIUM HYDROXIDE-SIMETHICONE (MAALOX) 200-200-20 MG/5ML SUSP SUSPENSION    Take 30 mLs by mouth as needed for Indigestion    ATROPINE 0.25 MG/5ML SOSY INJECTION    Infuse 0.05 mg intravenously once    BUSPIRONE (BUSPAR) 10 MG TABLET    Take 10 mg by mouth 2 times daily    CHOLECALCIFEROL (VITAMIN D3) 3000 UNITS TABS    Take by mouth    DIVALPROEX (DEPAKOTE ER) 500 MG EXTENDED RELEASE TABLET    Take 1 tablet by mouth daily    FERROUS SULFATE 325 (65 FE) MG TABLET    Take  325 mg by mouth 3 times daily (with meals)    GABAPENTIN (NEURONTIN) 600 MG TABLET    Take 600 mg by mouth nightly    HYDROXYZINE (VISTARIL) 50 MG CAPSULE    Take 50 mg by mouth 3 times daily as needed for Itching    LATANOPROST (XALATAN) 0.005 % OPHTHALMIC SOLUTION    1 drop nightly    LOPERAMIDE (IMODIUM) 2 MG CAPSULE    Take 2 mg by mouth as needed for Diarrhea    MIRTAZAPINE (REMERON) 30 MG TABLET    Take 30 mg by mouth nightly    MODAFINIL (PROVIGIL) 200 MG TABLET    Take 200 mg by mouth daily.Marland Kitchen    OMEPRAZOLE (PRILOSEC) 20 MG DELAYED RELEASE CAPSULE    Take 20 mg by mouth 2 times daily    ONDANSETRON (ZOFRAN) 4 MG TABLET    Take 4 mg by mouth every 8 hours as needed for Nausea or Vomiting    PREDNISONE (DELTASONE) 10 MG TABLET    Take by oral route 4 tabs x 3 days, then 3 tabs x 3 days, then 2  tabs x 3 days, then 1 tab x 3 days, then discontinue. Take with food.    SIMETHICONE (MYLICON) 80 MG CHEWABLE TABLET    Take 80 mg by mouth 4 times daily (before meals and nightly)    TIZANIDINE (ZANAFLEX) 4 MG TABLET    Take 4 mg by mouth every 6 hours as needed       ALLERGIES     is allergic to other and penicillins.    FAMILY HISTORY     has no family status information on file.      family history is not on file.    SOCIAL HISTORY      reports that he has never smoked. He does not have any smokeless tobacco history on file. He reports current alcohol use. He reports that he does not use drugs.    PHYSICAL EXAM     INITIAL VITALS:  height is 6' (1.829 m) and weight is 230 lb (104.3 kg). His tympanic temperature is 97.6 ??F (36.4 ??C). His blood pressure is 151/93 (abnormal) and his pulse is 109. His respiration is 18 and oxygen saturation is 96%.      General: Patient alert nontoxic-appearing male in no apparent distress  HEENT: Head shows a 1 and half centimeter vertical superficial laceration to the mid forehead with no active bleeding no step-offs or deformities pupils are equal reactive extraocular muscles are  intact without nystagmus  Neck: Supple no meningismus no C-spine tenderness  Respiratory: Lung sounds are clear bilateral heart is regular rate and rhythm  Neuro: Patient has no gross focal neurological deficits at bedside exam    DIFFERENTIAL DIAGNOSIS/ MDM:     Obtain CAT scan    DIAGNOSTIC RESULTS     EKG: All EKG's are interpreted by the Emergency Department Physician who either signs or Co-signs this chart in the absence of a cardiologist.        RADIOLOGY:   I directly visualized the following  images and reviewed the radiologist interpretations:  CT HEAD WO CONTRAST   Final Result   No acute intracranial abnormality.               LABS:  Labs Reviewed - No data to display      EMERGENCY DEPARTMENT COURSE:   Vitals:    Vitals:    09/23/20 2229   BP: (!) 151/93   Pulse: 109   Resp: 18   Temp: 97.6 ??F (36.4 ??C)   TempSrc: Tympanic   SpO2: 96%   Weight: 230 lb (104.3 kg)   Height: 6' (1.829 m)     -------------------------  BP: (!) 151/93, Temp: 97.6 ??F (36.4 ??C), Pulse: 109, Resp: 18    Orders Placed This Encounter   Medications   ??? ibuprofen (ADVIL;MOTRIN) tablet 800 mg   ??? ondansetron (ZOFRAN-ODT) disintegrating tablet 4 mg           Re-evaluation Notes    CT per radiologist shows nothing acute at this time patient will be discharged with follow-up as directed return if worse    CRITICAL CARE:   None      CONSULTS:      PROCEDURES:  None    FINAL IMPRESSION      1. Injury of head, initial encounter    2. Laceration of forehead, initial encounter          DISPOSITION/PLAN   DISPOSITION Decision To Discharge 09/23/2020 11:39:38 PM      Condition on Disposition  Stable    PATIENT REFERRED TO:  No follow-up provider specified.    DISCHARGE MEDICATIONS:  New Prescriptions    No medications on file       (Please note that portions of this note were completed with a voice recognition program.  Efforts were made to edit the dictations but occasionally words are mis-transcribed.)    Jacki Cones, MD,, MD,  F.A.C.E.P.  Attending Emergency Physician        Jacki Cones, MD  09/23/20 [phone]

## 2020-09-24 ENCOUNTER — Inpatient Hospital Stay
Admit: 2020-09-24 | Discharge: 2020-09-24 | Disposition: A | Discharge status: Home / Self Care | Payer: PRIVATE HEALTH INSURANCE | Attending: Emergency Medicine

## 2020-09-24 MED ORDER — IBUPROFEN 800 MG PO TABS
800 MG | Freq: Once | ORAL | Status: AC
Start: 2020-09-24 — End: 2020-09-23
  Administered 2020-09-24: 03:00:00 800 mg via ORAL

## 2020-09-24 MED ORDER — ONDANSETRON 4 MG PO TBDP
4 MG | Freq: Once | ORAL | Status: AC
Start: 2020-09-24 — End: 2020-09-23
  Administered 2020-09-24: 03:00:00 4 mg via ORAL

## 2020-09-24 MED FILL — IBUPROFEN 800 MG PO TABS: 800 mg | ORAL | Qty: 1

## 2020-09-24 MED FILL — ONDANSETRON 4 MG PO TBDP: 4 mg | ORAL | Qty: 1

## 2020-10-09 ENCOUNTER — Ambulatory Visit: Admit: 2020-10-09 | Discharge: 2020-10-09 | Payer: PRIVATE HEALTH INSURANCE | Attending: Neurology

## 2020-10-09 ENCOUNTER — Encounter

## 2020-10-09 DIAGNOSIS — G43009 Migraine without aura, not intractable, without status migrainosus: Secondary | ICD-10-CM

## 2020-10-09 MED ORDER — DIVALPROEX SODIUM 250 MG PO TBEC
250 MG | ORAL_TABLET | ORAL | 1 refills | Status: DC
Start: 2020-10-09 — End: 2020-12-20

## 2020-10-09 NOTE — Telephone Encounter (Signed)
Formatting of this note might be different from the original.  Last Appt:  10/09/2020  Next Appt:   11/09/2020  Med verified in Epic    Patient was seen this morning and would like medication sent to the [facility] pharmacy.    Thank you  Electronically signed by Interface, Incoming Notes-[facility] Source Conversion at 08/24/2021  9:12 AM EDT

## 2020-10-09 NOTE — Addendum Note (Signed)
Addended by: Franchot Gallo on: 10/09/2020 01:42 PM     Modules accepted: Orders

## 2020-10-09 NOTE — Patient Instructions (Signed)
*   SEIZURE  PRECAUTIONS.                 A)  Avoid  Working  At   Heights.          B)  Avoid  Working  With  Heavy machinery.          C)  Avoid   Swimming,  Climbing  A  Ladder   Unattended.          D)  Avoid   Driving   If  You   Have  A  Seizure.          E)  Must   Be  Seizure  Free   For  At   Least   6 months,  Before   You  Can drive.                                                                                                                                    F) Some times  Your  May  Feel  Seizure coming  Before  It  Begins.  You  May feel             Strange smell or funny  Feeling  In  Your  Stomach,  Which is  Called   Aura.                     TIPS  TO  REDUCE/ PREVENT  SEIZURES         1.  Take  Your  Anti seizure  Medications   As   Recommended.       2. Get   Enough   Sleep.   Sleep  Deprivation   Can  Trigger  A  Seizure.       3. If   You   Have  A fever,  Treat  It  At  Once,  And  Contact   Your  Primary  Care Providers.                                                                                                                           4. Avoid   Alcohol.       5. Avoid  Flashing  Lights,  Loud  Noises and  TV  And  Video  Games,             As   These  May  Trigger   Your  Seizures       6.  Control  Your  Stress  And   Have  Adequate  Rest.       7.   If  You  Feel  A  Seizure  Coming   On :           A) warn people  Who  Are  With  You           B)  Make  Sure  There  Are  No  Sharp or  Hard  Objects  Around you.           C)  Lay down  On  Your  Side  And  Relax.                  * FALL   PRECAUTIONS.          *  USE   WALKING  ASSISTANCE  DEVICES     QUAD  CANE       *   ADEQUATE   FLUID  INTAKE   AND  ELECTROLYTE  BALANCE           * KEEP  DAIRY  OF   THE  NEUROLOGICAL  SYMPTOMS          *  TO  MAINTAIN  REGULAR  SLEEP  WAKE  CYCLES.     *   TO  HAVE  ADEQUATE  REST  AND   SLEEP    HOURS.        *    AVOID  USAGE OF   TOBACCO,  EXCESSIVE  ALCOHOL                AND   ILLEGAL    SUBSTANCES,  IF  ANY          *  Maintain   Healthy  Life Style    With   Periodic  Monitoring  Of      Any  Medical  Conditions  Including   Elevated  Blood  Pressure,  Lipid  Profile,     Blood  Sugar levels  And   Heart  Disease.                *   Period   Screening  For  Cancers  Involving  Breast,  Colon,   lungs  And  Other  Organs  As  Applicable,  In consultation   With  Your  Primary Care Providers.                    *  If   There is  Any  Significant  Worsening   Of  Current  Symptoms  And  Or  If    Any additional  New  Neurological  Symptoms                 Or  Significant  Concerns   Should  Call  [phone] or  Go  To  Emergency  Department  For  Further  Immediate  Evaluation.

## 2020-10-09 NOTE — Progress Notes (Signed)
[facility]  Neurology    [address], [place]  [address]  Phone:[phone]   Fax: [phone]        SUBJECTIVE:       PATIENT ID:  Larry Koch is a  RIGHT    HANDED 39 y.o. male.      Migraine   This is a chronic problem. Episode onset: FOR   15  YEARS. The problem occurs intermittently. The problem has been waxing and waning. Associated symptoms include back pain, blurred vision, dizziness, insomnia, a loss of balance, neck pain, numbness, phonophobia, photophobia, tingling, a visual change and weakness. Pertinent negatives include no abdominal pain, abnormal behavior, anorexia, coughing, drainage, ear pain, eye pain, eye redness, eye watering, facial sweating, fever, hearing loss, muscle aches, nausea, rhinorrhea, scalp tenderness, seizures, sinus pressure, sore throat, swollen glands, tinnitus, vomiting or weight loss. The symptoms are aggravated by unknown. He has tried nothing for the symptoms. The treatment provided no relief.   Seizures  This is a chronic problem. Episode onset: SINCE    2015  The problem occurs intermittently (DURING     HIS  SLEEP  NIGHT  TIME  ). The problem has been waxing and waning. Associated symptoms include diaphoresis, neck pain, numbness, a visual change and weakness. Pertinent negatives include no abdominal pain, anorexia, arthralgias, change in bowel habit, chest pain, chills, congestion, coughing, fatigue, fever, headaches, joint swelling, myalgias, nausea, rash, sore throat, swollen glands, urinary symptoms, vertigo or vomiting. Associated symptoms comments: MEMORY   PROBLEMS  . Nothing aggravates the symptoms. He has tried nothing for the symptoms. The treatment provided no relief.   Cerebrovascular Accident  This is a chronic problem. Episode onset: SINCE   2017   The problem has been resolved. Associated symptoms include diaphoresis, neck pain, numbness, a visual change and weakness. Pertinent negatives include no abdominal pain,  anorexia, arthralgias, change in bowel habit, chest pain, chills, congestion, coughing, fatigue, fever, headaches, joint swelling, myalgias, nausea, rash, sore throat, swollen glands, urinary symptoms, vertigo or vomiting. Associated symptoms comments: RESIDUAL   LEFT   HEMIPARESIS  . Nothing aggravates the symptoms. He has tried nothing for the symptoms. The treatment provided no relief.             History obtained from  The   PATIENT         and other  available   medical  records   were  Also  reviewed.            The  Duration,  Quality,  Severity,  Location,  Timing,  Context,  Modifying  Factors   Of   The   Chief   Complaint       And  Present  Illness  Was   Reviewed   In   Chronological   Manner.                                            PATIENT'S  MAIN  CONCERNS INCLUDE :                       1)    KNOWN     H/O    MIGRAINES    AND  HEADACHES  FOR  MORE   THAN    15   YEARS                               2)     H/O      WORSENING  OF  MIGRAINES   AND    HEADACHES                                   ASSOCIATED     WITH   NAUSEA ,    VOMITING,  PHOTOPHOBIA                                       PAIN   IN    THE   LEFT    EYE                                                             3)     H/O    SEIZURES       DURING   NIGHT   TIME         SINCE                                          2015        AS    WITNESSED   BY    HIS   EX   WIFE                                4)     FAMILY     HISTORY    OF   SEIZURES                                         -   MATERNAL  SIDE                            5)     NO     PREVIOUS    H/O    HEAD INJURY.                                     NO   PRECIPITATING  FACTORS     SEIZURES    REPORTED                             6)        PREVIOUS     STROKE       IN    2017  WITH   HOSPITALIZATION    FOR  TWO  WEEKS                                        H/O     MILD     RESIDUAL     HEMIPARESIS                          7)      PREVIOUS     H/O   NEUROLOGY    EVALUATIONS      AT                                  [place]  [facility]                                BEING     FOLLOWED    BY    VA   FACILITY    AT   [place]                          8)       PATIENT     DENIES      ANY    TOBACCO ,  ALCOHOL                                    OR     ILLEGAL   DRUG   USAGE                      9)       WAS    TREATED      WITH    DEPAKOTE     IN    THE  PAST                        10)       H/O    CHRONIC    ANXIETY,   DEPRESSION ,  BIPOLAR  DISORDER                                                  FOR    MORE    THAN   13  YEARS                             -      WAS       ON    EFFEXOR,                               CURRENTLY    ON    SEROQUEL,  CLONIDINE                                  BEING        FOLLOWED  WITH    MENTAL  HEALTH  PROFESSIONALS                          11)      CO  MORBID  MULTIPLE  MEDICAL   CONDITION                                         TO  FOLLOW  WITH    HIS  PCP                      12)       H/O    CHRONIC     MILD    MEMORY  PROBLEMS                     13)      H/O     CHRONIC    MILD  BRIEF    DIZZINESS                                      INTERMITTENTLY     FOR   TWO   YEARS                       14)   H/O    MILD     BALANCE  PROBLEMS                                                            PREVIOUS     H/O   BRIEF     FALLING                                 PATIENT  USES   CANE  AS  NEEDED                                               15)     IN  VIEW  OF  THE  PATIENT'S    MULTIPLE   NEUROLOGICAL                           SYMPTOMS  AND  CONCERNS    FOR  PROLONGED   DURATION,                           AND    MULTIPLE   CO MORBID  MEDICAL  CONDITIONS,                           PATIENT    NEEDS  NEURO  DIAGNOSTIC  EVALUATIONS                      FOR   ANY   NEUROLOGICAL  PATHOLOGIES    AND  OTHER                        CORRECTABLE   ETIOLOGIES;     AND                              PATIENT  REQUESTS    THE  SAME.                             16)       VARIOUS  RISK   FACTORS   WERE  REVIEWED   AND   DISCUSSED.                       PATIENT   HAS  MULTIPLE   MEDICAL, NEUROLOGICAL                        AND   MENTAL HEALTH   PROBLEMS .                    PATIENT'S   MANAGEMENT  IS  CHALLENGING.                                        PRECIPITATING  FACTORS: including  fever/infection, exertion/relaxation, position change, stress,                weather change,   medications/alcohol, time of day/darkness/light  Are  present                                                          MODIFYING  FACTORS:  fever/infection, exertion/relaxation, position change, stress, weather change,               medications/alcohol, time of day/darkness/light  Are  present                Patient   Indicates   The  Presence   And  The  Absence  Of  The  Following    Associated  And             Additional  Neurological    Symptoms:                                Balance  And coordination   problems  present           Gait problems     present            Headaches      present              Migraines           present           Memory problemspresent              Confusion        absent            Paresthesia   numbness  present           Seizures  And  Starring  Episodes           present           Syncope,  Near  syncopal episodes         absent           Speech   problems           absent             Swallowing   Problems      absent            Dizziness,  Light headedness           present              Vertigo        absent             Generalized   Weakness    absent              focal  Weakness     present             Tremors         absent              Sleep  Problems     present             History  Of   Recent  Head  Injury     absent             History  Of   Recent  TIA     absent             History  Of   Recent    Stroke     absent             Neck  Pain   and   Neck muscle  Spasms  present               Radiating  down    And   Weakness           present            Lower back   Pain  And     Spasms  present              Radiating    Down   And   Weakness          present                H/OFALLS        present               History  Of   Visual  Symptoms    absent                  Associated   Diplopia       absent                                               Also   Additional   Symptoms   Present    As  Documented    In   The   detailed                  Review  Of  Systems   And    Please   Refer   To    Them for   Additional    Information.                    Any components  That are either  Unobtainable  Or  Limited  In   HPI, ROS  And/or PFSH   Are                   Due   ToPatient's  Medical  Problems,  Clinical  Condition   and/or lack of                                 other    Alternate   resources.            RECORDS   REVIEWED:    historical medical records           INFORMATION   REVIEWED:     MEDICAL   HISTORY,SURGICAL   HISTORY,     MEDICATIONS   LIST,   ALLERGIES AND  DRUG  INTOLERANCES,       FAMILY   HISTORY,  SOCIAL  HISTORY,      PROBLEM  LIST   FOR  PATIENT  CARE   COORDINATION          Past Medical History:   Diagnosis Date   ??? Anxiety    ??? Cerebral artery occlusion with cerebral infarction (HCC) 2017   ??? GERD (gastroesophageal reflux disease)    ??? Glaucoma    ??? Personality disorder (HCC)    ??? PTSD (post-traumatic stress disorder)    ??? PUD (peptic ulcer disease)    ??? Seizures (HCC)          Past Surgical History:   Procedure Laterality Date   ??? TONSILLECTOMY           Current Outpatient Medications   Medication Sig Dispense Refill   ??? cloNIDine (CATAPRES) 0.1 MG tablet TAKE ONE TABLET BY MOUTH AT BEDTIME FOR BLOOD PRESSURE     ??? QUEtiapine (SEROQUEL) 25 MG tablet TAKE ONE TABLET BY MOUTH AT BEDTIME FOR 3 DAYS, THEN TAKE TWO TABLETS AT BEDTIME     ??? venlafaxine (EFFEXOR XR) 37.5 MG extended release capsule TAKE ONE CAPSULE BY MOUTH ONCE DAILY FOR 14 DAYS, THEN TAKE TWO CAPSULES PLEASE     ??? Multiple  Vitamins-Minerals (MULTIVITAMINS/MINERALS ADULT PO) TAKE 1 TABLET BY MOUTH ONCE DAILY FOR SUPPLEMENTATION     ??? divalproex (DEPAKOTE) 250 MG DR tablet ONE  TABLET    AM    AND  TWO   TABLETS   PM 90 tablet 1   ??? omeprazole (PRILOSEC) 20 MG delayed release capsule Take 20 mg by mouth 2 times daily       No current facility-administered medications for this visit.         Allergies   Allergen Reactions   ??? Other      blueberries, raspberries, blackberries. Vaccination reaction.   ??? Penicillins    ??? Sulfamethoxazole-Trimethoprim Nausea Only         History reviewed. No pertinent family history.      Social History     Socioeconomic History   ??? Marital status: Married     Spouse name: Not on file   ??? Number of children: Not on file   ???  Years of education: Not on file   ??? Highest education level: Not on file   Occupational History   ??? Not on file   Tobacco Use   ??? Smoking status: Never Smoker   ??? Smokeless tobacco: Never Used   Vaping Use   ??? Vaping Use: Never used   Substance and Sexual Activity   ??? Alcohol use: Not Currently     Comment: 1 drink per year   ??? Drug use: No   ??? Sexual activity: Yes     Partners: Female   Other Topics Concern   ??? Not on file   Social History Narrative   ??? Not on file     Social Determinants of Health     Financial Resource Strain:    ??? Difficulty of Paying Living Expenses: Not on file   Food Insecurity:    ??? Worried About Running Out of Food in the Last Year: Not on file   ??? Ran Out of Food in the Last Year: Not on file   Transportation Needs:    ??? Lack of Transportation (Medical): Not on file   ??? Lack of Transportation (Non-Medical): Not on file   Physical Activity:    ??? Days of Exercise per Week: Not on file   ??? Minutes of Exercise per Session: Not on file   Stress:    ??? Feeling of Stress : Not on file   Social Connections:    ??? Frequency of Communication with Friends and Family: Not on file   ??? Frequency of Social Gatherings with Friends and Family: Not on file   ??? Attends Religious  Services: Not on file   ??? Active Member of Clubs or Organizations: Not on file   ??? Attends Banker Meetings: Not on file   ??? Marital Status: Not on file   Intimate Partner Violence:    ??? Fear of Current or Ex-Partner: Not on file   ??? Emotionally Abused: Not on file   ??? Physically Abused: Not on file   ??? Sexually Abused: Not on file   Housing Stability:    ??? Unable to Pay for Housing in the Last Year: Not on file   ??? Number of Places Lived in the Last Year: Not on file   ??? Unstable Housing in the Last Year: Not on file       Vitals:    10/09/20 0953   BP: 102/72   Pulse: 75   SpO2: 97%         Wt Readings from Last 3 Encounters:   10/09/20 244 lb 3.2 oz (110.8 kg)   09/23/20 230 lb (104.3 kg)   06/11/17 233 lb (105.7 kg)         BP Readings from Last 3 Encounters:   10/09/20 102/72   09/23/20 (!) 131/91   06/11/17 120/82           Hematology and Coagulation    Lab Results   Component Value Date    WBC 8.2 04/22/2017    RBC 5.77 04/22/2017    HGB 17.4 04/22/2017    HCT 49.4 04/22/2017    MCV 85.6 04/22/2017    MCH 30.2 04/22/2017    MCHC 35.2 04/22/2017    RDW 12.3 04/22/2017    PLT 245 04/22/2017    MPV 10.3 04/22/2017         Chemistries    Lab Results   Component Value Date    NA 140 04/22/2017  K 4.3 04/22/2017    CL 105 04/22/2017    CO2 23 04/22/2017    BUN 11 04/22/2017    CREATININE 0.8 04/22/2017    CALCIUM 8.2 04/22/2017    PROT 6.6 06/22/2016    LABALBU 3.8 06/22/2016    BILITOT 0.2 06/22/2016    ALKPHOS 74 06/22/2016    AST 14 06/22/2016    ALT 13 06/22/2016     Lab Results   Component Value Date    ALKPHOS 74 06/22/2016    ALT 13 06/22/2016    AST 14 06/22/2016    PROT 6.6 06/22/2016    BILITOT 0.2 06/22/2016    LABALBU 3.8 06/22/2016     Lab Results   Component Value Date    BUN 11 04/22/2017    CREATININE 0.8 04/22/2017     Lab Results   Component Value Date    CALCIUM 8.2 04/22/2017     Lab Results   Component Value Date    AST 14 06/22/2016    ALT 13 06/22/2016         Review of Systems    Constitutional: Positive for diaphoresis. Negative for appetite change, chills, fatigue, fever, unexpected weight change and weight loss.   HENT: Negative for congestion, dental problem, drooling, ear discharge, ear pain, facial swelling, hearing loss, mouth sores, nosebleeds, postnasal drip, rhinorrhea, sinus pressure, sore throat, tinnitus, trouble swallowing and voice change.    Eyes: Positive for blurred vision and photophobia. Negative for pain, discharge, redness, itching and visual disturbance.   Respiratory: Negative for apnea, cough, choking, chest tightness, shortness of breath and wheezing.    Cardiovascular: Negative for chest pain, palpitations and leg swelling.   Gastrointestinal: Negative for abdominal distention, abdominal pain, anorexia, blood in stool, change in bowel habit, constipation, diarrhea, nausea and vomiting.   Endocrine: Negative for cold intolerance, heat intolerance, polydipsia, polyphagia and polyuria.   Musculoskeletal: Positive for back pain, gait problem and neck pain. Negative for arthralgias, joint swelling, myalgias and neck stiffness.   Skin: Negative for color change, pallor, rash and wound.   Allergic/Immunologic: Negative for environmental allergies, food allergies and immunocompromised state.   Neurological: Positive for dizziness, tingling, weakness, light-headedness, numbness and loss of balance. Negative for vertigo, tremors, seizures, syncope, facial asymmetry, speech difficulty and headaches.   Hematological: Negative for adenopathy. Does not bruise/bleed easily.   Psychiatric/Behavioral: Positive for decreased concentration and sleep disturbance. Negative for agitation, behavioral problems, confusion, dysphoric mood, hallucinations, self-injury and suicidal ideas. The patient is nervous/anxious and has insomnia. The patient is not hyperactive.          OBJECTIVE:      Physical Exam  Constitutional:       Appearance: He is well-developed.   HENT:      Head:  Normocephalic and atraumatic. No raccoon eyes or Battle's sign.      Right Ear: External ear normal.      Left Ear: External ear normal.      Nose: Nose normal.   Eyes:      Conjunctiva/sclera: Conjunctivae normal.      Pupils: Pupils are equal, round, and reactive to light.   Neck:      Thyroid: No thyroid mass or thyromegaly.      Vascular: No carotid bruit.      Trachea: No tracheal deviation.      Meningeal: Brudzinski's sign and Kernig's sign absent.   Cardiovascular:      Rate and Rhythm: Normal rate and regular rhythm.  Pulmonary:      Effort: Pulmonary effort is normal.   Musculoskeletal:         General: No tenderness.      Cervical back: Normal range of motion and neck supple. No rigidity. No muscular tenderness. Normal range of motion.   Skin:     General: Skin is warm.      Coloration: Skin is not pale.      Findings: No erythema or rash.      Nails: There is no clubbing.   Psychiatric:         Attention and Perception: He is attentive.         Mood and Affect: Mood is not anxious or depressed. Affect is not labile, blunt or inappropriate.         Speech: He is communicative. Speech is not rapid and pressured, delayed, slurred or tangential.         Behavior: Behavior is not agitated, slowed, aggressive, withdrawn, hyperactive or combative. Behavior is cooperative.         Thought Content: Thought content is not paranoid or delusional. Thought content does not include homicidal or suicidal ideation. Thought content does not include homicidal or suicidal plan.         Cognition and Memory: Memory is impaired. He exhibits impaired recent memory. He does not exhibit impaired remote memory.         Judgment: Judgment is not impulsive or inappropriate.               NEUROLOGICALEXAMINATION :      A) MENTAL STATUS:                   Alert and  oriented  To time, place  And  Person.                  No Aphasia.  No  Dysarthria.                    Able   To  Follow     SIMPLE    commands   without   Any   Difficulty.                   No right  To left confusion.                  Normal  Speech  And language function.                   Insight and  Judgment ,Fund  Of  Knowledge   within normal limits                Recent  And  Remote memory    DECREASED                Attention &  Concentration are        DECREASED                                            B) CRANIAL NERVES :        CN : Visual  Acuity  And  Visual fields  within normal limits               Fundi  Could  Not  Be  Could  Not  Be  Evaluated.  3,4,6 CN : Both  Pupils are   Reactive and  Equal.  Movements  Are  Intact.                             No  Nystagmus.  No  INO.  No  Afferent  Pupillary  Defect noted.          5 CN :  Normal  Facial sensations and Corneal  Reflexes           7 CN:  Normal  Facial  Symmetry  And  Strength.  No facial  Weakness.           8 CN :  Hearing  Appears within normal limits          9, 10 CN: Normal   spontaneous, reflex   palate   movements         11 CN:   Normal  Shoulder  shrug and  strength         12 CN :   Normal  Tongue movements and  Tongue  In midline                        No tongue   Fasciculations or atrophy         C) MOTOR  EXAM:                   Strength  In upper  And  Lower   extremities     DECREASED                          MILD   RESIDUAL  LEFT  HEMIPARESIS                             DECREASED   HAND  GRIPS                   No  Drift.     No  Atrophy               Rapid   alternating  And  repetitions  Movements   DECREASED                 Muscle  Tone  In upper  And  Lower  Extremities  normal                No rigidity.  No  Spasticity.                 Bradykinesia   absent                 No  Asterixis.              Sustention  Tremor , Resting   Tremor   absent                    No   other  Abnormal  Movements noted           D) SENSORY :               Light   touch, pinprick,   position  And  Vibration    DECREASED      E) REFLEXES:  Deep  Tendon  Reflexes     DECREASED                                              F) COORDINATION  AND  GAIT :                                Station and  Gait    SLOW     WITH  MILD  LIMP                           Romberg 's test    POSITIVE                            Ataxia negative          ASSESSMENT:        Patient Active Problem List   Diagnosis   ??? 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 Bipolar II disorder, most recent episode major depressive (HCC)   ??? Borderline personality disorder (HCC)   ??? Seizure disorder (HCC)   ??? Headache   ??? Insomnia   ??? Low back pain   ??? Lumbar spondylosis   ??? Migraine without aura and without status migrainosus, not intractable   ??? Obstructive sleep apnea syndrome in adult   ??? Pituitary cyst (HCC)   ??? Restless legs   ??? Sleep apnea   ??? H/O: stroke   ??? Chronic tension-type headache, intractable   ??? Memory problem   ??? Dizziness   ??? Chronic neck pain           VISITING DIAGNOSIS:      ICD-10-CM    1. Migraine without aura and without status migrainosus, not intractable  G43.009    2. Bipolar II disorder, most recent episode major depressive (HCC)  F31.81    3. 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 Primary insomnia  F51.01    5. Low back pain with sciatica, sciatica laterality unspecified, unspecified back pain laterality, unspecified chronicity  M54.40    6. Obstructive sleep apnea syndrome in adult  G47.33    7. Borderline personality disorder (HCC)  F60.3    8. Other sleep apnea  G47.39    9. Seizure disorder (HCC)  G40.909    10. H/O: stroke  Z33.73    49. Chronic tension-type headache, intractable  G44.221    12. Memory problem  R41.3    13. Dizziness  R42    14. Chronic neck pain  M54.2     G89.29                 CONCERNS   &   INCREASED   RISK   FOR        * TIA,  CEREBRO  VASCULAR  ISCHEMIA, STROKE     *   DIZZINESS,   VERTEBROBASILAR  INSUFFICIENCY ,         *  SEIZURE  ACTIVITY,  EPILEPSY ,        *   COMPLICATED  MIGRAINES      *    CHRONIC  TENSION  HEADACHES      *    COGNITIVE  &  MEMORY PROBLEMS        *  MONONEUROPATHIES,   RADICULOPATHY       *   PERIPHERAL  NEUROPATHY,       *  GAIT  DIFFICULTIES  &   BALANCE PROBLMES   AND  FALL                VARIOUS  RISK   FACTORS   WERE  REVIEWED   AND   DISCUSSED.          *  PATIENT   HAS  MULTIPLE   MEDICAL, NEUROLOGICAL                        AND   MENTAL HEALTH   PROBLEMS .           PATIENT'S   MANAGEMENT  IS  CHALLENGING.            PLAN:                         Ashby Dawes  Of  The  Diagnoses,  The  Management & Treatment  Options            AND    Care  plan  Were          Reviewed and   Discussed   With  patient.           * Goals  And  Expectations  Of  The  Therapy  Discussed   And  Reviewed.          *   Benefits   And   Side  Effect  Profile  Of  Medication/s   Were   Discussed                * Need   For  Further   Follow up For  The  Various  Problems Were  discussed.          * Results  Of  The  Previous  Diagnostic tests were reviewed and  discussed                   Medical  Decision  Making  Was  Made  Based on the   Complexity  Of  Above  Mentioned  Diagnoses,    Data reviewed             And    Risk  Of  Significant   Co morbidities and   complicating   Factors.                 Medical  Decision  Was     High    Complexity   Due   To  The  Patient's  Multiple  Symptoms  &  Disease,            Complex  Treatment  Regimen,  Multiple medications           and   Risk  Of   Side  Effects,  Difficulty  In  Medication  Management  And  Diagnostic  Challenges           In  View  Of  The  Associated   Co  Morbid  Conditions   And  Problems.                           *  FALL   PRECAUTIONS.      THESE  REVIEWED   AND  DISCUSSED      *  USE   WALKING  ASSISTANCE  DEVICES     QUAD  CANE        *   BE  CAREFUL  WITH  ACTIVITIES           *   AVOID   NECK  AND/ BACK  STRAINING  ACTIVITIES            *   ADEQUATE   FLUID  INTAKE   AND  ELECTROLYTE  BALANCE           * KEEP  DAIRY  OF   THE  NEUROLOGICAL  SYMPTOMS        RECORDING THE     DURATION  AND  FREQUENCY.          *  AVOID    CONDITIONS  AND  FACTORS   THAT  MAKE                  NEUROLOGICAL  SYMPTOMS  WORSE.        *   SEIZURE  PRECAUTIONS.                 A)  Avoid  Working  At   [employer].          B)  Avoid  Working  With  Engineer, manufacturing systems.          C)  Avoid   Swimming,  Climbing  A  Ladder   Unattended.          D)  Avoid   Driving   If  You   Have  A  Seizure.          E)  Must   Be  Seizure  Free   For  At   Least   6 months,  Before   You  Can drive.          F) Some times  Your  May  Feel  Seizure coming  Before  It  Begins.                You  May feelsmell or funny  Feeling  In  Your  Stomach,  Which is  Called   Aura.                     TIPS  TO  REDUCE/ PREVENT  SEIZURES           1.  Take  Your  Anti seizure  Medications   As   Recommended.       2.  Get   Enough   Sleep.   Sleep  Deprivation   Can  Trigger  A  Seizure.       3.  If   You   Have  A fever,  Treat  It  At  Once,  And  Contact   Your  Primary  Care Providers.  4. Avoid   Alcohol.       5. Avoid  Flashing  Lights,  Loud  Noises and  TV  And  Video  Games,             As   These  May  Trigger   Your  Seizures       6.  Control  Your  Stress  And   Have  Adequate  Rest.       7.   If  You  Feel  A  Seizure  Coming On :             A) warn people  Who  Are  With  You             B)  Make  Sure  There  Are  No  Sharp or  Hard  Objects  Around you.             C)  Lay down  On  Your  Side  And  Relax.                  *TO  MAINTAIN  REGULAR  SLEEP  WAKE  CYCLES.     *   TO  HAVE  ADEQUATE  REST  AND   SLEEP    HOURS.            *    AVOID  ANY USAGE OF    TOBACCO,          AVOID  EXCESSIVE  ALCOHOL  AND   ILLEGAL   SUBSTANCES          *  CONTINUE   MEDICATIONS    PRESCRIBED       AS    RECOMMENDED       *   Compliance   With  Medications   And  Instructions            *    MIGRAINE/ HEADACHE     DAIRY   WITH  MONITORING                       OF  DURATION  AND  FREQUENCY.        *  May   Use  Pill  Box,    If  Needed        *  MEDICATIONS TO AVOID:    WELLBUTRIN,  ULTRAM            *    Antiplatelet  therapy    As   Recommended  Was   Discussed      *    Prophylactic  Use   Of     Vitamin   B   Complex,  Folic  Acid,    Vitamin  B12    Multivitamin,       Calcium  With  magnesium  And  Vit D    Supplementations   Over  The  Counter  Discussed               *  PATIENT  IS  ALSO   COUNSELED   TO  KEEP    ACTIVITIES:         A)   SIMPLE      B)  ORGANIZED      C)  WRITEDOWN                     *  EVALUATIONS  AND  FOLLOW UP:                   * PHYSICAL  THERAPY                                 *CARDIOLOGY              *   OPTHALMOLOGY                                                               *   IN  VIEW  OF  THE  PATIENT'S    MULTIPLE   NEUROLOGICAL                           SYMPTOMS  AND  CONCERNS    FOR  PROLONGED   DURATION,                           AND    MULTIPLE   CO MORBID  MEDICAL  CONDITIONS,                           PATIENT    NEEDS  NEURO  DIAGNOSTIC  EVALUATIONS                      FOR   ANY   NEUROLOGICAL  PATHOLOGIES    AND  OTHER                        CORRECTABLE   ETIOLOGIES;     AND                              PATIENT  REQUESTS   THE  SAME.                             Controlled Substances Monitoring: Periodic Controlled Substance Monitoring: Possible medication side effects, risk of tolerance/dependence & alternative treatments discussed., Assessed functional status. Luiz Iron, MD)            Orders Placed This Encounter   Procedures   ??? MRI BRAIN WO CONTRAST   ??? VL DUP CAROTID BILATERAL   ??? XR CERVICAL SPINE (4-5 VIEWS)   ??? VL TRANSCRANIAL DOPPLER COMPLETE   ??? CBC   ??? Sedimentation Rate   ??? ANA Screen with Reflex   ??? Comprehensive Metabolic Panel   ??? Lupus Anticoagulant   ??? Homocysteine, Serum   ??? Factor 5 Leiden   ??? TSH   ??? Vitamin B12 & Folate   ??? T. pallidum Ab   ??? EEG    ??? PR TRANSCRAN DOPPLER INTRACRAN ART         Orders Placed This Encounter   Medications   ??? divalproex (DEPAKOTE) 250 MG DR tablet     Sig: ONE  TABLET    AM    AND  TWO   TABLETS   PM  Dispense:  90 tablet     Refill:  1                           *  PATIENT  TO  RETURN  THE  CLINIC  AFTER   ABOVE,                       FOR   FURTHER    RE EVALUATIONS                               AND  ADDITIONAL  RECOMMENDATIONS             *PATIENT   TO  FOLLOW  UP  WITH   PRIMARY  CARE         OTHER  CONSULTANTS  AS  BEFORE.               *TO  FOLLOW  WITH   MENTAL  HEALTH  PROFESSIONALS ,  INCLUDING            PSYCHOLOGICAL  COUNSELING   AND  PSYCHIATRIC  EVALUTIONS,                     *  Maintain   Healthy  Life Style    With   Periodic  Monitoring  Of          Any  Medical  Conditions  Including   Elevated  Blood  Pressure,  Lipid  Profile,        Blood  Sugar levels  AndHeart  Disease.                *   Period   Screening  For  Cancers  Involving  Breast,  Colon,         Lungs  And  Other  Organs  As  Applicable,  In consultation   With  Your  Primary Care Providers.               *Second  Neurological  Opinion  And  Evaluations  In  [facility]  Setting  If  Patient  Is  Interested.                 * Please   Contact   Neurology  Clinic   Early   If   Are  Any  New  Neurological   And  Any neurological  Concerns.                   *  If  The  Patient remains  Neurologically  Stable   Return   To  [facility] Neurology Department   IN      1-2       MONTHS  TIME   FOR  FURTHER              FOLLOW UP.                       *   The  Neurological   Findings,  Possible  Diagnosis,  Differential diagnoses                    And  Options  For  Further   Investigations                   And  management   Are  Discussed  Comprehensively.                    Medications   And  Prescription   Risks  And  Side effects  Are   Also  Discussed.                     *  If   There is  Any  Significant   Worsening   Of  Current  Symptoms  And  Or                  If patient  Develops   Any additional  New  NeurologicalSymptoms                  Or  Significant  Concerns   Should  Call  911 or  Go  To  Emergency  Department                  For  Further  Immediate  Evaluation and  management .                         The   Above  Were  Reviewed  With  PATIENT   and                         questions  Answered  In  Detail.                                              TOTAL   TIME     SPENT :               On this date 10/09/2020 I have spent  60  minutes    reviewing previous notes, test results               and face to face time with the patient including     discussing the diagnosis and importance of compliance               with the treatment plan  as well as documenting on the day of the visit.                                                            Electronically signed by   Luiz IronPRASAD  N.  Tammy Wickliffe,  M.D., Larene BeachFAAN.     Board Certified in  Neurology &  In  Brain Injury Medicine   American Board of Psychiatry and Neurology (ABPN)      DISCLAIMER:   Although every effort was made to ensure the accuracy of this  electronictranscription, some errors in transcription may have occurred.      GENERAL PATIENT INSTRUCTIONS:     ??? A Healthy Lifestyle: Care Instructions  ??? Your Care Instructions  ??? A healthy lifestyle can help you feel good, stay at ahealthy weight, and have plenty of energy  for both work and play. A healthy lifestyle is something you can share with your whole family.  ??? A healthy lifestyle also can lower your risk for serious health problems, such ashigh blood pressure, heart disease, and diabetes.  ??? You can follow a few steps listed below to improve your health and the health of your family.  ??? Follow-up careis a key part of your treatment and safety. Be sure to make and go to all appointments, and call your doctor if you are having problems. It???s also a good idea to know your test results and keep a list  of the medicines you take.  ??? How can you care for yourself at home?  ??? Do not eat too much sugar, fat, or fast foods. You can still have dessert and treats nowand then. The goal is moderation.  ??? Start small to improve your eating habits. Pay attention to portion sizes, drink less juice and soda pop, and eat more fruits and vegetables.  ??? Eat a healthy amount of food. A 3-ounce serving of meat, for example, is about the size of a deck of cards. Fill the rest of your plate with vegetables and whole grains.  ??? Limit theamount of soda and sports drinks you have every day. Drink more water when you are thirsty.  ??? Eat at least 5 servings of fruits and vegetables every day. It may seem like a lot, but it is not hard to reach this goal. Aserving or helping is 1 piece of fruit, 1 cup of vegetables, or 2 cups of leafy, raw vegetables. Have an apple or some carrot sticks as an afternoon snack instead of a candy bar. Try to have fruits and/or vegetables at everymeal.  ??? Make exercise part of your daily routine. You may want to start with simple activities, such as walking, bicycling, or slow swimming. Try tobe active 30 to 60 minutes every day. You do not need to do all 30 to 60 minutes all at once. For example, you can exercise 3 times a day for 10 or 20 minutes. Moderate exercise is safe for most people, but it is always agood idea to talk to your doctor before starting an exercise program.  ??? Keep moving. Mow the lawn, work in the garden, or [employer]. Take the stairs instead of the elevator at work.  ??? If you smoke, quit. Peoplewho smoke have an increased risk for heart attack, stroke, cancer, and other lung illnesses. Quitting is hard, but there are ways to boost your chance of quitting tobacco for good.  ??? Use nicotine gum, patches, or lozenges.  ??? Ask your doctor about stop-smoking programs and medicines.  ??? Keep trying.  ??? In addition to reducing your risk of diseases in the future, you will notice some  benefits soon after you stop using tobacco. If you have shortness of breath or asthma symptoms, they will likely getbetter within a few weeks after you quit.  ??? Limit how much alcohol you drink. Moderate amounts of alcohol (up to 2 drinks a day for men, 1drink a day for women) are okay. But drinking too much can lead to liver problems, high blood pressure, and other health problems.  ??? health  ??? If you have a family, there are many things you can do together to improve your health.  ??? Eat meals together as a family as often as possible.  ??? Eat healthy foods. This includes fruits, vegetables, lean meats and dairy, and whole  grains.  ??? Include your family in your fitness plan. Most peoplethink of activities such as jogging or tennis as the way to fitness, but there are many ways you and your family can be more active. Anything that makes you breathe hard and gets your heart pumping is exercise. Here are sometips:  ??? Walk to do errands or to take your child to school or the bus.  ??? Go for a family bike ride after dinner instead of watching TV.  ??? Where can you learn more?  ??? Go [email or web address] and sign in to your MyChart account. Enter [phone] in the Search HealthInformation box to learn more about "A Healthy Lifestyle: Care Instructions."     If you do not have anaccount, please click on the "Sign Up Now" link.  ??? Current as of: June 20, 2015  ??? Content Version: 11.2  ??? ?? 2006-2017 [employer]. Care instructions adapted under license by [facility]. If you have questions about a medical condition or this instruction, always ask your healthcare professional. [employer] disclaims any warranty or liability for your use of this information.

## 2020-10-09 NOTE — Telephone Encounter (Signed)
Last Appt:  10/09/2020  Next Appt:   11/09/2020  Med verified in Epic    Patient was seen this morning and would like medication sent to the [facility] pharmacy.    Thank you

## 2020-10-23 ENCOUNTER — Inpatient Hospital Stay: Admit: 2020-10-23 | Payer: PRIVATE HEALTH INSURANCE

## 2020-10-23 DIAGNOSIS — F3181 Bipolar II disorder: Secondary | ICD-10-CM

## 2020-10-24 ENCOUNTER — Ambulatory Visit: Payer: PRIVATE HEALTH INSURANCE

## 2020-10-31 ENCOUNTER — Ambulatory Visit: Payer: PRIVATE HEALTH INSURANCE

## 2020-11-02 ENCOUNTER — Inpatient Hospital Stay: Admit: 2020-11-02 | Payer: PRIVATE HEALTH INSURANCE

## 2020-11-02 DIAGNOSIS — Z8673 Personal history of transient ischemic attack (TIA), and cerebral infarction without residual deficits: Secondary | ICD-10-CM

## 2020-11-02 DIAGNOSIS — G43009 Migraine without aura, not intractable, without status migrainosus: Secondary | ICD-10-CM

## 2020-11-02 LAB — CBC
Hematocrit: 46.5 % (ref 40.7–50.3)
Hemoglobin: 15.9 g/dL (ref 13.0–17.0)
MCH: 29.2 pg (ref 25.2–33.5)
MCHC: 34.2 g/dL — ABNORMAL HIGH (ref 25.2–33.5)
MCV: 85.5 fL (ref 82.6–102.9)
MPV: 9.7 fL (ref 8.1–13.5)
NRBC Automated: 0 per 100 WBC
Platelets: 289 10*3/uL (ref 138–453)
RBC: 5.44 m/uL (ref 4.21–5.77)
RDW: 12.5 % (ref 11.8–14.4)
WBC: 7.7 10*3/uL (ref 3.5–11.3)

## 2020-11-02 LAB — TSH: TSH: 1.08 mIU/L (ref 0.30–5.00)

## 2020-11-02 LAB — SEDIMENTATION RATE: Sed Rate: 3 mm (ref 0–15)

## 2020-11-02 NOTE — Procedures (Signed)
[facility]                 [address] [place], [place] [address]                             Transcranial Doppler (TCD)        PATIENT NAME: Larry Koch, Larry Koch                    DOB:        1981-08-10  MED REC NO:   [id number]                             ROOM:  ACCOUNT NO:   [id number]                           ADMIT DATE: 11/02/2020      PROVIDER:     Gypsy Decant, MD      DATE OF STUDY:  11/02/2020    TECHNIQUE:  Transcranial Doppler study of intracranial arteries was  performed using [employer] equipment with digital Doppler technology, with  high resolution 250 Gate M-mode display and Multigate Spectral Windows  and 2 MHz Doppler probes via temporal, suboccipital and orbital  approaches.    Transcranial Doppler study of the intracranial arteries was also  performed for emboli detection without intravenous microbubble injection  using continuous soundtrack, M-Mode with Multigate Spectral displays and  soundtrack displays with continuous bilateral monitoring.      CLINICAL DATA:      The patient is 39 years old with a history of stroke and  left-sided weakness, headaches, migraines, pituitary cyst, memory  problem, dizziness.    The purpose of the study is to evaluate for stroke, intracranial focal  stenosis, flow abnormalities, vertebrobasilar insufficiency evaluations.      SUMMARY:        The mean flow velocities in the right and left anterior,  middle, and posterior cerebral arteries are low, except for normal mean  flow velocities in the right anterior cerebral artery with borderline to  normal PI values.    Mean flow velocities in the right middle cerebral artery is low with  elevated PI values.    Mean flow velocities in the right and left vertebral arteries, basilar  artery are low with normal PI values.      TCD OF INTRACRANIAL ARTERIES FOR EMBOLI DETECTION:    Review and analysis of the waveforms and continuous soundtrack systems  during the current monitoring  show no evidence of HITS (high intensity  transient signals) and no embolic shower events were detected.        IMPRESSION:        1.  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    2.  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    3.  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    4.  TCD of intracranial arteries for emboli detection is negative.         Further clinical correlation and followup recommended.          Gypsy Decant, MD, FAAN     Board Certified in Neurology  & in  Brain Injury Medicine  American Board of Psychiatry and Neurology (ABPN).  D: 11/02/2020 16:09:41       T: 11/02/2020 17:25:47     [email or web address]  Job#: [id number]     Doc#: [id number]

## 2020-11-02 NOTE — Progress Notes (Signed)
EXTENDED EEG Completed with Spike Analysis.    PCP: No primary care provider on file.    Ordering: Prasad N. Policherla, Neurologist    Interpreting Physician: Prasad N, Policherla, Neurologist    Technician: Charonda Hefter L Hearl Heikes, RN

## 2020-11-02 NOTE — Progress Notes (Signed)
TCD Completed with Emboli Detection.    PCP: No primary care provider on file.    Ordering: Renard Matter. Policherla, Neurologist    Interpreting Physician: Renard Matter. Policherla, Neurologist    Electronically signed by Jerelene Redden, RN on 11/02/2020 at 8:51 AM

## 2020-11-02 NOTE — Procedures (Signed)
[facility]                 [address] [place], [place] [address]                         ELECTROENCEPHALOGRAM (EEG) REPORT      PATIENT NAME: Larry Koch, Larry Koch                    DOB:        Aug 13, 1981  MED REC NO:   [id number]                             ROOM:  ACCOUNT NO:   [id number]                           ADMIT DATE: 11/02/2020    PROVIDER:     Gypsy Decant, MD      DATE OF STUDY:  11/02/2020    TECHNIQUE:  23 channels of EEG, 2 channels of EOG, 2 channel of EKG and  1 channel ground and 1 channel reference were recorded with Natus/Xltek  Software 32 Channel System utilizing the International 10/20 System  Protocol.    Spike detection and seizure analysis protocols were utilized and the  study was reviewed using the comprehensive EEG monitoring.    This is an extended EEG recorded for 1 hour and 2 minutes.      CLINICAL DATA:      The patient is 39 years old with a history of bipolar  disorder, seizure disorder, migraines, pituitary cyst, history of  stroke, memory problems, dizziness.    The purpose of the study is to evaluate for seizure activity.      MEDICATIONS:  Depakote, Seroquel, Effexor.      BACKGROUND ACTIVITY:      While the patient was awake, the background  activity consisted of poor-to-fairly regulated 8 to 9 Hz waveform seen  over both cerebral hemispheres.    Intermittent eye movement and frontal muscle artifacts noted.    There are generalized slow waves, slow sharp waves noted diffusely.    ACTIVATION PROCEDURES:    HYPERVENTILATION:  Hyperventilation was performed for 3 minutes. Patient  was cooperative with good effort.  Also, post-hyperventilation period  was monitored closely.      Hyperventilation:  Unremarkable.    PHOTIC STIMULATION:  Photic stimulation was performed at the following  frequencies: 1 Hz, 3 Hz, 6 Hz, 9 Hz, 12 Hz, 15 Hz, 18 Hz, 21 Hz, 25 Hz,  30 Hz and patient tolerated well.    Photic stimulation:  No significant driving  response noted.    SLEEP:  Stage I.    EKG:  EKG showed normal sinus rhythm without any significant  abnormality.        IMPRESSION:          There is mild-to-moderate irritable diffuse cerebral     dysfunction noted.           Further clinical correlation and followup recommended.          Gypsy Decant, MD, FAAN     Board Certified in Neurology  & in  Brain Injury Medicine  American Board of Psychiatry and Neurology (ABPN).           D: 11/02/2020 16:06:33       T: 11/02/2020 17:16:42  [email or web address]  Job#: [id number]     Doc#: [id number]

## 2020-11-03 LAB — VITAMIN B12 & FOLATE
Folate: 19.1 ng/mL (ref >4.8)
Vitamin B-12: 416 pg/mL (ref 232–1245)

## 2020-11-03 LAB — HOMOCYSTEINE: Homocysteine: 13.9 umol/L (ref <15.0)

## 2020-11-03 LAB — T. PALLIDUM AB: T. pallidum, IgG: NONREACTIVE

## 2020-11-07 ENCOUNTER — Inpatient Hospital Stay: Admit: 2020-11-07 | Payer: PRIVATE HEALTH INSURANCE

## 2020-11-07 DIAGNOSIS — M5440 Lumbago with sciatica, unspecified side: Secondary | ICD-10-CM

## 2020-11-07 LAB — ANA SCREEN WITH REFLEX
ANA: NEGATIVE
Anti ds DNA: 1.4 IU/mL (ref <10.0)
ENA Antibodies Screen: 0.2 U/mL (ref <0.7)

## 2020-11-07 NOTE — Procedures (Signed)
[facility]                 [address] [place], [place] [address]                        EMG/NERVE CONDUCTION STUDIES REPORT      PATIENT NAME: Larry Koch, [name]                    DOB:        [birth year]  MED REC NO:   [id number]                             ROOM:  ACCOUNT NO:   [id number]                           ADMIT DATE: 11/07/2020      PROVIDER:     Gypsy Decant, MD    DATE OF EMG:  11/07/2020      TECHNICAL SUMMARY:  The nature, purpose, goals, expectations and process  involved in the procedures of nerve conduction studies and needle  electromyography were reviewed, discussed, explained and verbal consent  was obtained from the patient. The patient's questions were answered  with reference to the above processes and procedures.    There were no significant technical difficulties encountered during this  study and nerve conduction studies were performed under temperature  monitoring.      CLINICAL DATA:      The patient is 39 years old male with a history of  numbness, tingling, paresthesias involving both hands, forearms, upper  arms, more so on the left side.  The patient also has weakness involving  the left upper extremity.  The patient also has neck pain, shoulder  pains bilaterally intermittently.  The patient also has previous history  of stroke in 2017.    The purpose of the study is to evaluate for mononeuropathy,  radiculopathy, peripheral polyneuropathy.      SUMMARY:        The sensory nerve action potentials of the right and left  radial nerves are within normal limits bilaterally.    Sensory nerve action potentials of the right and left median nerves are  fairly within normal limits.    Sensory nerve action potentials of the ulnar nerves are also within  normal limits bilaterally.    Mixed nerve palmar potentials of the right and left median nerve shows  low amplitudes with a normal distal latency.  Ulnar palmar potentials  are within normal limits  bilaterally.    Compound muscle action potentials of the right and left median nerve  shows normal amplitude, distal latency, conduction velocities.    Compound muscle action potentials of the right and left ulnar nerve  shows normal amplitude, distal latency, conduction velocities.    Proximal conductions as measured by the F responses were normal in both  median and ulnar nerves bilaterally.           Nerve  Conductions   Results  Were  Personally  Reviewed and  Analysed.   Abnormal  Nerve  Conductions  Were  Personally  Repeated,  Verified, reconfirmed  And  Updated as needed  appropriately.       Please    See   Wave  Forms   And    Details  Of  Nerve  Conduction   Studies   For  Additional  Information             Extensive   Needle  Electromyography  Was personally  Performed  In  Both      Upper   Extremities    In  The  Following  Muscles :    Deltoid,  Biceps  Brachii,  Triceps . Pronator  Teres,  Flexor Carpi Ulnaris,    Ext. Digitorum Communis,  FDI (Hand)         Extensive  Needle  Electromyography  Shows  No   Abnormality with :      A) Normal  insertional  Activity.      There  Is  Absence  Of   P  Waves,  Fibrillations,  Fasciculations and        Other  Spontaneous   Activity.     B) Voluntary  Motor unit  Potentials    Show :    Normal  Effort,  Recruitment, amplitude,  Duration.     No Polyphasia  Noted.            IMPRESSION:      1.  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      2.  There is no definite electrodiagnostic evidence of any other        mononeuropathy, radiculopathy, plexopathy, or peripheral polyneuropathy        involving examined both upper extremities.            Further clinical correlation and followup recommended.          Gypsy Decant, MD, FAAN     Board Certified in Neurology  & in  Brain Injury Medicine  American Board of Psychiatry and Neurology (ABPN).           D: 11/07/2020  10:04:15       T: 11/07/2020 10:46:44     [email or web address]  Job#: [id number]     Doc#: [id number]    CC:

## 2020-11-07 NOTE — Progress Notes (Signed)
EMG/NCS Bilateral    upper Completed    PCP: No primary care provider on file.    Ordering: Prasad N. Policherla, Neurologist    Interpreting Physician: Prasad N. Policherla, Neurologist    Technician: Stepheny Canal L Resha Filippone, RN

## 2020-11-09 ENCOUNTER — Ambulatory Visit: Payer: PRIVATE HEALTH INSURANCE | Attending: Neurology

## 2020-11-09 LAB — FACTOR 5 LEIDEN: Factor V Mutation: NEGATIVE

## 2020-11-09 NOTE — Telephone Encounter (Signed)
This Clinical research associate contacted patient in re: missed appointment today with Dr. Carley Hammed - no answer, no show letter sent.

## 2020-11-10 LAB — LUPUS ANTICOAGULANT
Anticardiolipin IgA: 3.7 [APL'U] (ref 0.0–14.0)
Anticardiolipin IgG: 1.4 [GPL'U] (ref 0.0–10.0)
Cardiolipin Ab IgM: <0.8 [MPL'U] (ref 0.0–10.0)
Dilute Viper Venom Time: NEGATIVE
INR: 1
PTT: 26.7 s (ref 20.5–30.5)
Protime: 11 s (ref 9.1–12.3)

## 2020-11-14 ENCOUNTER — Ambulatory Visit: Payer: PRIVATE HEALTH INSURANCE

## 2020-12-12 ENCOUNTER — Inpatient Hospital Stay: Admit: 2020-12-12 | Payer: PRIVATE HEALTH INSURANCE

## 2020-12-12 DIAGNOSIS — G629 Polyneuropathy, unspecified: Secondary | ICD-10-CM

## 2020-12-12 NOTE — Procedures (Signed)
[facility]                 [address] [place], [place] [address]                          EMG/NERVE CONDUCTION STUDIES REPORT        PATIENT NAME: Larry Koch, Larry Koch                    DOB:        1981/10/24  MED REC NO:   [id number]                             ROOM:  ACCOUNT NO:   [id number]                           ADMIT DATE: 12/12/2020      PROVIDER:     Gypsy Decant, MD    DATE OF EMG:  12/12/2020      TECHNICAL SUMMARY:  The nature, purpose, goals, expectations and process  involved in the procedures of nerve conduction studies and needle  electromyography were reviewed, discussed, explained and verbal consent  was obtained from the patient. The patient's questions were answered  with reference to the above processes and procedures.    There were no significant technical difficulties encountered during this  study and nerve conduction studies were performed under temperature  monitoring.      CLINICAL DATA:      The patient is 40 years old male with a history of  weakness involving both lower extremities, more so on the left side.   The patient has history of chronic lower back pain.  The patient also  complains of burning, numbness, tingling, paresthesias involving both  feet, legs for many years.  The patient has previous history of stroke  in 2016.  The patient complains of increasing weakness in both lower  extremities, history of falls recently multiple times.    The purpose of the study is to evaluate for mononeuropathy,  radiculopathy, plexopathy, peripheral polyneuropathy, myopathy.      SUMMARY:      The sensory nerve action potentials of the right and left  sural and superficial peroneal nerves were not recordable bilaterally.    Compound muscle action potentials of the right and left peroneal nerve  shows normal amplitude, distal latencies, borderline conduction  velocities bilaterally.    Compound muscle action potentials of the right and left tibial  nerve  shows normal amplitude, distal latencies, borderline conduction  velocities bilaterally.    Proximal conductions as measured by the F responses were not recordable  in both peroneal and tibial nerves bilaterally.    Tibial H responses appear fairly within normal limits on the left side,  borderline on the right side.           Nerve  Conductions   Results  Were  Personally  Reviewed and  Analysed.   Abnormal  Nerve  Conductions  Were  Personally  Repeated,  Verified, reconfirmed  And  Updated as needed  appropriately.       Please    See   Wave  Forms   And    Details  Of     Nerve  Conduction   Studies   For  Additional  Information  Extensive   Needle  Electromyography  Was personally  Performed  In  Both       Lower  Extremities     In  The  Following  Muscles :      Gluteus  Medius,   Vastus  Lateralis,  Internal  Hamstring,    External  Hamstring,   Anterior Tibialis,  Medial  Gastrocnemius,          Extensive  Needle  Electromyography  Shows  No   Abnormality with :      A) Normal  insertional  Activity.      There  Is  Absence  Of   P  Waves,  Fibrillations,  Fasciculations and        Other  Spontaneous   Activity.     B) Voluntary  Motor unit  Potentials    Show :    Normal  Effort,  Recruitment, amplitude,  Duration.     No Polyphasia  Noted.              IMPRESSION:      1.  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      2.  Nonrecordable F responses are suggestive of involvement of L4-L5         Myotomes.      3.  Needle electromyography shows no active denervation changes or         chronic reinnervation changes.      4.  There is no definite electrodiagnostic evidence of inflammatory        myopathy involving examined both lower extremities.               Further clinical correlation and followup recommended.          Gypsy Decant, MD, FAAN     Board Certified in Neurology  & in  Brain  Injury Medicine  American Board of Psychiatry and Neurology (ABPN).         D: 12/12/2020 10:00:50       T: 12/12/2020 10:54:39     [email or web address]  Job#: [id number]     Doc#: [id number]    CC:

## 2020-12-12 NOTE — Progress Notes (Signed)
EMG/NCS Bilateral    lower Completed    PCP: No primary care provider on file.    Ordering: Prasad N. Policherla, Neurologist    Interpreting Physician: Prasad N. Policherla, Neurologist    Technician: Balin Vandegrift L Naara Kelty, RN

## 2020-12-15 ENCOUNTER — Inpatient Hospital Stay
Admit: 2020-12-15 | Discharge: 2020-12-16 | Disposition: A | Discharge status: Home / Self Care | Payer: PRIVATE HEALTH INSURANCE | Attending: Emergency Medicine

## 2020-12-15 DIAGNOSIS — R45851 Suicidal ideations: Secondary | ICD-10-CM

## 2020-12-15 LAB — CBC WITH AUTO DIFFERENTIAL
Absolute Eos #: 0.2 10*3/uL (ref 0.00–0.44)
Absolute Immature Granulocyte: <0.03 10*3/uL (ref 0.00–0.30)
Absolute Lymph #: 2.8 10*3/uL (ref 1.10–3.70)
Absolute Mono #: 0.57 10*3/uL (ref 0.10–1.20)
Basophils Absolute: 0.09 10*3/uL (ref 0.00–0.20)
Basophils: 1 % (ref 0–2)
Eosinophils %: 3 % (ref 1–4)
Hematocrit: 45.1 % (ref 40.7–50.3)
Hemoglobin: 15.2 g/dL (ref 13.0–17.0)
Immature Granulocytes: 0 %
Lymphocytes: 37 % (ref 24–43)
MCH: 28.9 pg (ref 25.2–33.5)
MCHC: 33.7 g/dL — ABNORMAL HIGH (ref 25.2–33.5)
MCV: 85.7 fL (ref 82.6–102.9)
MPV: 9.9 fL (ref 8.1–13.5)
Monocytes: 8 % (ref 3–12)
NRBC Automated: 0 per 100 WBC
Platelets: 278 10*3/uL (ref 138–453)
RBC: 5.26 m/uL (ref 4.21–5.77)
RDW: 12.6 % (ref 11.8–14.4)
Seg Neutrophils: 51 % (ref 36–65)
Segs Absolute: 3.81 10*3/uL (ref 1.50–8.10)
WBC: 7.5 10*3/uL (ref 3.5–11.3)

## 2020-12-15 LAB — BASIC METABOLIC PANEL
Anion Gap: 9 mmol/L (ref 9–17)
BUN: 9 mg/dL (ref 6–20)
Bun/Cre Ratio: 11 (ref 9–20)
CO2: 25 mmol/L (ref 20–31)
Calcium: 9.2 mg/dL (ref 8.6–10.4)
Chloride: 108 mmol/L — ABNORMAL HIGH (ref 98–107)
Creatinine: 0.79 mg/dL (ref 0.70–1.20)
GFR African American: >60 mL/min (ref >60)
GFR Non-African American: >60 mL/min (ref >60)
Glucose: 104 mg/dL — ABNORMAL HIGH (ref 70–99)
Potassium: 3.7 mmol/L (ref 3.7–5.3)
Sodium: 142 mmol/L (ref 135–144)

## 2020-12-15 LAB — URINE DRUG SCREEN
Amphetamine Screen, Ur: NEGATIVE
Barbiturate Screen, Ur: NEGATIVE
Benzodiazepine Screen, Urine: NEGATIVE
Buprenorphine Urine: NEGATIVE
Cannabinoid Scrn, Ur: NEGATIVE
Cocaine Metabolite, Urine: NEGATIVE
Methadone Screen, Urine: NEGATIVE
Methamphetamine, Urine: NEGATIVE
Opiates, Urine: NEGATIVE
Oxycodone Screen, Ur: NEGATIVE
Phencyclidine, Urine: NEGATIVE
Propoxyphene, Urine: NEGATIVE
Tricyclic Antidepressants, Urine: POSITIVE — AB

## 2020-12-15 LAB — ETHANOL: Ethanol: <10 mg/dL (ref <10)

## 2020-12-15 LAB — SALICYLATE LEVEL: Salicylate Lvl: <1 mg/dL — ABNORMAL LOW (ref 3–10)

## 2020-12-15 LAB — ACETAMINOPHEN LEVEL: Acetaminophen Level: <5 ug/mL — ABNORMAL LOW (ref 10–30)

## 2020-12-15 NOTE — ED Provider Notes (Addendum)
[facility]  [address]  [place] [address]  Phone: [phone]  EMERGENCY DEPARTMENT ENCOUNTER      Pt Name: Larry Koch  MRN: [id number]  Birthdate [birth year]  Date of evaluation: 12/15/2020    CHIEF COMPLAINT       Chief Complaint   Patient presents with   ??? Suicidal   ??? Stress       HISTORY OF PRESENT ILLNESS    Larry Koch is a 40 y.o. male who presents to the emergency room with suicidal ideation and increased stress over the past few days.  He is involved with a legal trial and is stressed out because of that feels like he is at the end of his rope.  He thought about overdosing on his pills last night but got a call from his attorney which allowed him to hold off but still feels like he could tip either way at this point.  Previous suicide attempt.  He does have counselors at the [place].    REVIEW OF SYSTEMS       Constitutional: No fevers or chills   HEENT: No sore throat, rhinorrhea, or earache   Eyes: No blurry vision or double vision no drainage   Cardiovascular: No chest pain or tachycardia   Respiratory: No wheezing or shortness of breath no cough   Gastrointestinal: No nausea, vomiting, diarrhea, constipation, or abdominal pain   GU: No hematuria or dysuria   Musculoskeletal: No swelling or pain   Skin: No rash   Neurological: No focal neurologic complaints, paresthesias, weakness, or headache     PAST MEDICAL HISTORY    has a past medical history of Anxiety, Cerebral artery occlusion with cerebral infarction (HCC), GERD (gastroesophageal reflux disease), Glaucoma, Personality disorder (HCC), PTSD (post-traumatic stress disorder), PUD (peptic ulcer disease), and Seizures (HCC).    SURGICAL HISTORY      has a past surgical history that includes Tonsillectomy.    CURRENT MEDICATIONS       Previous Medications    CLONIDINE (CATAPRES) 0.1 MG TABLET    TAKE ONE TABLET BY MOUTH AT BEDTIME FOR BLOOD PRESSURE    DIVALPROEX (DEPAKOTE) 250 MG DR TABLET    ONE  TABLET    AM    AND  TWO    TABLETS   PM    MULTIPLE VITAMINS-MINERALS (MULTIVITAMINS/MINERALS ADULT PO)    TAKE 1 TABLET BY MOUTH ONCE DAILY FOR SUPPLEMENTATION    OMEPRAZOLE (PRILOSEC) 20 MG DELAYED RELEASE CAPSULE    Take 20 mg by mouth 2 times daily    QUETIAPINE (SEROQUEL) 25 MG TABLET    TAKE ONE TABLET BY MOUTH AT BEDTIME FOR 3 DAYS, THEN TAKE TWO TABLETS AT BEDTIME    VENLAFAXINE (EFFEXOR XR) 37.5 MG EXTENDED RELEASE CAPSULE    TAKE ONE CAPSULE BY MOUTH ONCE DAILY FOR 14 DAYS, THEN TAKE TWO CAPSULES PLEASE       ALLERGIES     is allergic to other, penicillins, and sulfamethoxazole-trimethoprim.    FAMILY HISTORY     has no family status information on file.      family history is not on file.    SOCIAL HISTORY      reports that he has never smoked. He has never used smokeless tobacco. He reports previous alcohol use. He reports that he does not use drugs.    PHYSICAL EXAM       ED Triage Vitals [12/15/20 1635]   BP Temp Temp Source Pulse Resp  SpO2 Height Weight   (!) 153/117 98.2 ??F (36.8 ??C) Tympanic 74 14 98 % 6\' 1"  (1.854 m) 235 lb (106.6 kg)       Constitutional: Alert, oriented x3, nontoxic, answering questions appropriately, acting properly for age, in no acute distress flat affect  HEENT: Extraocular muscles intact, mucus membranes moist,  Pupils equal, round, reactive to light,   Neck: Trachea midline   Cardiovascular: Regular rhythm and rate no murmurs   Respiratory: Clear to auscultation bilaterally no wheezes, rhonchi, rales, no respiratory distress no tachypnea no retractions no hypoxia  Gastrointestinal: Soft, nontender, nondistended, positive bowel sounds.  No rebound, rigidity, or guarding.   Musculoskeletal: No extremity pain or swelling   Neurologic: Moving all 4 extremities without difficulty there are no gross focal neurologic deficits   Skin: Warm and dry     DIFFERENTIAL DIAGNOSIS/ MDM:     Labs.  Evaluation by [facility]    DIAGNOSTIC RESULTS     EKG: All EKG's are interpreted by the Emergency Department  Physician who either signs or Co-signs this chart in the absence of a cardiologist.        Not indicated unless otherwise documented above    LABS:  Results for orders placed or performed during the hospital encounter of 12/15/20   Basic Metabolic Panel   Result Value Ref Range    Glucose 104 (H) 70 - 99 mg/dL    BUN 9 6 - 20 mg/dL    CREATININE 12/17/20 3.24 - 1.20 mg/dL    Bun/Cre Ratio 11 9 - 20    Calcium 9.2 8.6 - 10.4 mg/dL    Sodium 4.01 027 - 253 mmol/L    Potassium 3.7 3.7 - 5.3 mmol/L    Chloride 108 (H) 98 - 107 mmol/L    CO2 25 20 - 31 mmol/L    Anion Gap 9 9 - 17 mmol/L    GFR Non-African American >60 >60 mL/min    GFR African American >60 >60 mL/min    GFR Comment          GFR Staging NOT REPORTED    CBC Auto Differential   Result Value Ref Range    WBC 7.5 3.5 - 11.3 k/uL    RBC 5.26 4.21 - 5.77 m/uL    Hemoglobin 15.2 13.0 - 17.0 g/dL    Hematocrit 664 40.3 - 50.3 %    MCV 85.7 82.6 - 102.9 fL    MCH 28.9 25.2 - 33.5 pg    MCHC 33.7 (H) 25.2 - 33.5 g/dL    RDW 47.4 25.9 - 56.3 %    Platelets 278 138 - 453 k/uL    MPV 9.9 8.1 - 13.5 fL    NRBC Automated 0.0 0.0 per 100 WBC    Differential Type NOT REPORTED     Seg Neutrophils 51 36 - 65 %    Lymphocytes 37 24 - 43 %    Monocytes 8 3 - 12 %    Eosinophils % 3 1 - 4 %    Basophils 1 0 - 2 %    Immature Granulocytes 0 0 %    Segs Absolute 3.81 1.50 - 8.10 k/uL    Absolute Lymph # 2.80 1.10 - 3.70 k/uL    Absolute Mono # 0.57 0.10 - 1.20 k/uL    Absolute Eos # 0.20 0.00 - 0.44 k/uL    Basophils Absolute 0.09 0.00 - 0.20 k/uL    Absolute Immature Granulocyte <0.03 0.00 - 0.30  k/uL    WBC Morphology NOT REPORTED     RBC Morphology NOT REPORTED     Platelet Estimate NOT REPORTED    Ethanol   Result Value Ref Range    Ethanol <10 <10 mg/dL    Ethanol percent NOT REPORTED %   Acetaminophen Level   Result Value Ref Range    Acetaminophen Level <5 (L) 10 - 30 ug/mL   Salicylate   Result Value Ref Range    Salicylate Lvl <1 (L) 3 - 10 mg/dL       Not indicated unless  otherwise documented above    RADIOLOGY:   I reviewed the radiologist interpretations:    No orders to display       Not indicated unless otherwise documented above    EMERGENCY DEPARTMENT COURSE:     The patient was given the following medications:  No orders of the defined types were placed in this encounter.       Vitals:   -------------------------  BP (!) 153/117    Pulse 74    Temp 98.2 ??F (36.8 ??C) (Tympanic)    Resp 14    Ht 6\' 1"  (1.854 m)    Wt 235 lb (106.6 kg)    SpO2 98%    BMI 31.00 kg/m??       6 PM patient in no acute distress.  Normal CBC and BMP.  Alcohol acetaminophen and salicylates negative.  Awaiting urine tox screen.  Patient on the list to be evaluated by [facility].    At 7:30 PM care will be transferred to Dr.    CRITICAL CARE:    None    CONSULTS:    None    PROCEDURES:    None      OARRS Report if indicated             FINAL IMPRESSION      1. Suicidal ideation          (Please note that portions of this note were completed with a voice recognition program.  Efforts were made to edit the dictations but occasionally words are mis-transcribed.)    Sharyn Lull, DO   Attending Emergency Physician      Dorris Fetch, DO  12/15/20 1802       12/17/20, DO  12/16/20 1120

## 2020-12-15 NOTE — ED Notes (Signed)
Screener in process with the assessment at this time. Pt sitting up in bed eating pizza.     Nicolasa Ducking, RN  12/15/20 [phone]

## 2020-12-15 NOTE — ED Provider Notes (Addendum)
ADDENDUM:      Care of this patient was assumed from Dr. Laural Roes.  The patient was seen for Suicidal and Stress  .  The patient's initial evaluation and plan have been discussed with the prior provider who initially evaluated the patient.  Nursing Notes, Past Medical Hx, Past Surgical Hx, Social Hx, Allergies, and Family Hx were all reviewed.      Diagnostic Results       RADIOLOGY:   Non-plain film images such as CT, Ultrasound and MRI are read by the radiologist. Plain radiographic images are visualized and the radiologist interpretations are reviewed as follows:     None indicated    LABS:   Results for orders placed or performed during the hospital encounter of 12/15/20   Basic Metabolic Panel   Result Value Ref Range    Glucose 104 (H) 70 - 99 mg/dL    BUN 9 6 - 20 mg/dL    CREATININE 0.99 8.33 - 1.20 mg/dL    Bun/Cre Ratio 11 9 - 20    Calcium 9.2 8.6 - 10.4 mg/dL    Sodium 825 053 - 976 mmol/L    Potassium 3.7 3.7 - 5.3 mmol/L    Chloride 108 (H) 98 - 107 mmol/L    CO2 25 20 - 31 mmol/L    Anion Gap 9 9 - 17 mmol/L    GFR Non-African American >60 >60 mL/min    GFR African American >60 >60 mL/min    GFR Comment          GFR Staging NOT REPORTED    CBC Auto Differential   Result Value Ref Range    WBC 7.5 3.5 - 11.3 k/uL    RBC 5.26 4.21 - 5.77 m/uL    Hemoglobin 15.2 13.0 - 17.0 g/dL    Hematocrit 73.4 19.3 - 50.3 %    MCV 85.7 82.6 - 102.9 fL    MCH 28.9 25.2 - 33.5 pg    MCHC 33.7 (H) 25.2 - 33.5 g/dL    RDW 79.0 24.0 - 97.3 %    Platelets 278 138 - 453 k/uL    MPV 9.9 8.1 - 13.5 fL    NRBC Automated 0.0 0.0 per 100 WBC    Differential Type NOT REPORTED     Seg Neutrophils 51 36 - 65 %    Lymphocytes 37 24 - 43 %    Monocytes 8 3 - 12 %    Eosinophils % 3 1 - 4 %    Basophils 1 0 - 2 %    Immature Granulocytes 0 0 %    Segs Absolute 3.81 1.50 - 8.10 k/uL    Absolute Lymph # 2.80 1.10 - 3.70 k/uL    Absolute Mono # 0.57 0.10 - 1.20 k/uL    Absolute Eos # 0.20 0.00 - 0.44 k/uL    Basophils Absolute 0.09  0.00 - 0.20 k/uL    Absolute Immature Granulocyte <0.03 0.00 - 0.30 k/uL    WBC Morphology NOT REPORTED     RBC Morphology NOT REPORTED     Platelet Estimate NOT REPORTED    Urine Drug Screen   Result Value Ref Range    Amphetamine Screen, Ur NEGATIVE NEGATIVE    Barbiturate Screen, Ur NEGATIVE NEGATIVE    Benzodiazepine Screen, Urine NEGATIVE NEGATIVE    Cocaine Metabolite, Urine NEGATIVE NEGATIVE    Methadone Screen, Urine NEGATIVE NEGATIVE    Opiates, Urine NEGATIVE NEGATIVE    Phencyclidine, Urine NEGATIVE NEGATIVE  Propoxyphene, Urine NEGATIVE NEGATIVE    Cannabinoid Scrn, Ur NEGATIVE NEGATIVE    Oxycodone Screen, Ur NEGATIVE NEGATIVE    Methamphetamine, Urine NEGATIVE NEGATIVE    Tricyclic Antidepressants, Urine POSITIVE (A) NEGATIVE    MDMA, Urine NOT REPORTED NEGATIVE    Buprenorphine Urine NEGATIVE NEGATIVE    Test Information       The following threshold concentrations are established for the drug assays:   Ethanol   Result Value Ref Range    Ethanol <10 <10 mg/dL    Ethanol percent NOT REPORTED %   Acetaminophen Level   Result Value Ref Range    Acetaminophen Level <5 (L) 10 - 30 ug/mL   Salicylate   Result Value Ref Range    Salicylate Lvl <1 (L) 3 - 10 mg/dL       RECENT VITALS:  BP: (!) 153/117, Temp: 98.2 ??F (36.8 ??C), Pulse: 74, Resp: 14     ED Course     The patient was given the following medications:  No orders of the defined types were placed in this encounter.    During the emergency department course, suicidal precautions were taken.  Patient has been calm, cooperative and resting comfortably.    Medical Decision Making      40 year old male patient presents to the emergency department for evaluation of suicidal ideations and increased stress over past few days.  Patient stated that he is involved in the legal trials and is stressed out.  He thought about overdosing on his pills last night but got a call from his attorney which allowed him to hold off.  During the emergency department  course, suicidal precautions were taken.  I reviewed all the lab results and these are unremarkable.  Patient is medically stable and [facility] was [facility].  Patient was evaluated by prescreener from [facility] and was felt to be stable to be discharged home.  He has safety plan with his counselor and psychiatrist and will return to the emergency department if he again has any thoughts of suicide, increased depression, feeling worthless, hopeless or helpless.  He is advised to call us if any questions, concerns or problems and also follow-up at [facility].  Patient feels comfortable going home.    The patient understands that at this time there is no evidence for a more malignant underlying process, but also understands that early in the process of an illness or injury, an emergency department workup can be falsely reassuring.  Routine discharge counseling was given, and it is understood that worsening, changing or persistent symptoms should prompt an immediate call or follow up with their primary physician or return to the emergency department. The importance of appropriate follow up was also discussed.  I have reviewed the disposition diagnosis.  I have answered the questions and given discharge instructions.  There was voiced understanding of these instructions and no further questions or complaints.    Disposition     FINAL IMPRESSION      1. Suicidal ideation          DISPOSITION/PLAN   DISPOSITION        PATIENT REFERRED TO:  Your Tour manager at [facility] [employer] in 1 day  For reevaluation of current symptoms    [facility]  [address]  [place] [address]  [phone]    Call in 1 day  For reevaluation of current symptoms    [facility]  [address]  [place] [address]  [phone]    If symptoms worsen      DISCHARGE MEDICATIONS:  New Prescriptions    No medications on  file             (Please note that portions of this note were completed with a voice recognition program.  Efforts were made to edit the dictations but occasionally words are mis-transcribed.)    Lenice Pressman, MD,, MD, F.A.C.E.P.  Attending Emergency Medicine Physician               Lenice Pressman, MD  12/16/20 7510       Lenice Pressman, MD  12/16/20 [phone]

## 2020-12-16 NOTE — ED Notes (Signed)
Pt talking with screener, sitter at bedside.     Nicolasa Ducking, RN  12/16/20 [phone]

## 2020-12-16 NOTE — Discharge Instructions (Signed)
Please understand that at this time there is no evidence for a more serious underlying process, but that early in the process of an illness or injury, an emergency department workup can be falsely reassuring.  You should contact your family doctor within the next 48 hours for a follow up appointment    THANK YOU!!!    From [facility] and [facility]    On behalf of the Emergency Department staff at [facility], I would like to thank you for giving us the opportunity to address your health care needs and concerns.    We hope that during your visit, our service was delivered in a professional and caring manner. Please keep [facility] in mind as we walk with you down the path to your own personal wellness.     Please expect an automated text message or email from us so we can ask a few questions about your health and progress. Based on your answers, a clinician may call you back to offer help and instructions.    Please understand that early in the process of an illness or injury, an emergency department workup can be falsely reassuring.  If you notice any worsening, changing or persistent symptoms please call your family doctor or return to the ER immediately.     Tell us how we did during your visit at [email or web address]   and let us know about your experience

## 2020-12-20 ENCOUNTER — Ambulatory Visit: Admit: 2020-12-20 | Discharge: 2020-12-20 | Payer: PRIVATE HEALTH INSURANCE | Attending: Neurology

## 2020-12-20 DIAGNOSIS — G43009 Migraine without aura, not intractable, without status migrainosus: Secondary | ICD-10-CM

## 2020-12-20 MED ORDER — BUTALBITAL-APAP-CAFFEINE 50-325-40 MG PO TABS
50-325-40 MG | ORAL_TABLET | ORAL | 1 refills | Status: DC
Start: 2020-12-20 — End: 2021-02-26

## 2020-12-20 MED ORDER — SUMATRIPTAN SUCCINATE 100 MG PO TABS
100 MG | ORAL_TABLET | Freq: Once | ORAL | 2 refills | Status: DC | PRN
Start: 2020-12-20 — End: 2021-02-26

## 2020-12-20 MED ORDER — DIVALPROEX SODIUM 500 MG PO TBEC
500 MG | ORAL_TABLET | ORAL | 3 refills | Status: DC
Start: 2020-12-20 — End: 2021-02-26

## 2020-12-20 NOTE — Patient Instructions (Signed)
*   SEIZURE  PRECAUTIONS.                 A)  Avoid  Working  At   Heights.          B)  Avoid  Working  With  Heavy machinery.          C)  Avoid   Swimming,  Climbing  A  Ladder   Unattended.          D)  Avoid   Driving   If  You   Have  A  Seizure.          E)  Must   Be  Seizure  Free   For  At   Least   6 months,  Before   You  Can drive.                                                                                                                                    F) Some times  Your  May  Feel  Seizure coming  Before  It  Begins.  You  May feel             Strange smell or funny  Feeling  In  Your  Stomach,  Which is  Called   Aura.                     TIPS  TO  REDUCE/ PREVENT  SEIZURES         1.  Take  Your  Anti seizure  Medications   As   Recommended.       2. Get   Enough   Sleep.   Sleep  Deprivation   Can  Trigger  A  Seizure.       3. If   You   Have  A fever,  Treat  It  At  Once,  And  Contact   Your  Primary  Care Providers.                                                                                                                           4. Avoid   Alcohol.       5. Avoid  Flashing  Lights,  Loud  Noises and  TV  And  Video  Games,             As   These  May  Trigger   Your  Seizures       6.  Control  Your  Stress  And   Have  Adequate  Rest.       7.   If  You  Feel  A  Seizure  Coming   On :           A) warn people  Who  Are  With  You           B)  Make  Sure  There  Are  No  Sharp or  Hard  Objects  Around you.           C)  Lay down  On  Your  Side  And  Relax.                 * FALL   PRECAUTIONS.            *   ADEQUATE   FLUID  INTAKE   AND  ELECTROLYTE  BALANCE             * KEEP  DAIRY  OF   THE  NEUROLOGICAL  SYMPTOMS          *  TO  MAINTAIN  REGULAR  SLEEP  WAKE  CYCLES.     *   TO  HAVE  ADEQUATE  REST  AND   SLEEP    HOURS.          *    AVOID  USAGE OF   TOBACCO,  EXCESSIVE  ALCOHOL                AND   ILLEGAL   SUBSTANCES,  IF  ANY          *  Maintain   Healthy  Life  Style    With   Periodic  Monitoring  Of         Any  Medical  Conditions  Including   Elevated  Blood  Pressure,  Lipid  Profile,       Blood  Sugar levels  And   Heart  Disease.                *   Period   Screening  For  Cancers  Involving  Breast,  Colon,         Lungs  And  Other  Organs  As  Applicable,           In consultation   With  Your  Primary Care Providers.                *  If   There is  Any  Significant  Worsening   Of  Current  Symptoms  And             Or  If    Any additional  New  Neurological  Symptoms  and          Significant  Concerns   Should  Call  911 or  Go  To  Emergency  Department            For  Further  Immediate  Evaluation.

## 2020-12-20 NOTE — Progress Notes (Signed)
[facility]  Neurology    [address] [place]  [address]  Phone:[phone]   Fax: [phone]        SUBJECTIVE:       PATIENT ID:  Larry Koch is a  RIGHT    HANDED 40 y.o. male.      Migraine   This is a chronic problem. Episode onset: FOR   15  YEARS. The problem occurs intermittently. The problem has been waxing and waning. Associated symptoms include back pain, blurred vision, dizziness, insomnia, a loss of balance, neck pain, numbness, phonophobia, photophobia, tingling, a visual change and weakness. Pertinent negatives include no abdominal pain, abnormal behavior, anorexia, coughing, drainage, ear pain, eye pain, eye redness, eye watering, facial sweating, fever, hearing loss, muscle aches, nausea, rhinorrhea, scalp tenderness, seizures, sinus pressure, sore throat, swollen glands, tinnitus, vomiting or weight loss. The symptoms are aggravated by unknown. He has tried nothing for the symptoms. The treatment provided no relief.   Seizures  This is a chronic problem. Episode onset: SINCE    2015  The problem occurs intermittently (DURING     HIS  SLEEP  NIGHT  TIME  ). The problem has been waxing and waning. Associated symptoms include diaphoresis, neck pain, numbness, a visual change and weakness. Pertinent negatives include no abdominal pain, anorexia, arthralgias, change in bowel habit, chest pain, chills, congestion, coughing, fatigue, fever, headaches, joint swelling, myalgias, nausea, rash, sore throat, swollen glands, urinary symptoms, vertigo or vomiting. Associated symptoms comments: MEMORY   PROBLEMS  . Nothing aggravates the symptoms. He has tried nothing for the symptoms. The treatment provided no relief.   Cerebrovascular Accident  This is a chronic problem. Episode onset: SINCE   2017   The problem has been resolved. Associated symptoms include diaphoresis, neck pain, numbness, a visual change and weakness. Pertinent negatives include no abdominal pain,  anorexia, arthralgias, change in bowel habit, chest pain, chills, congestion, coughing, fatigue, fever, headaches, joint swelling, myalgias, nausea, rash, sore throat, swollen glands, urinary symptoms, vertigo or vomiting. Associated symptoms comments: RESIDUAL   LEFT   HEMIPARESIS  . Nothing aggravates the symptoms. He has tried nothing for the symptoms. The treatment provided no relief.             History obtained from  The   PATIENT         and other  available   medical  records   were  Also  reviewed.            The  Duration,  Quality,  Severity,  Location,  Timing,  Context,  Modifying  Factors   Of   The   Chief   Complaint       And  Present  Illness  Was   Reviewed   In   Chronological   [facility].                                            PATIENT'S  MAIN  CONCERNS INCLUDE :                       1)    KNOWN     H/O    MIGRAINES    AND  HEADACHES  FOR  MORE   THAN    15   YEARS                               2)     H/O      WORSENING  OF  MIGRAINES   AND    HEADACHES                                        ASSOCIATED     WITH   NAUSEA ,    VOMITING,  PHOTOPHOBIA                                       PAIN   IN    THE   LEFT    EYE                                                             3)          H/O    SEIZURES       DURING   NIGHT   TIME         SINCE                                          2015        AS    WITNESSED   BY    HIS   EX   WIFE                                4)        FAMILY     HISTORY    OF   SEIZURES                                         -   MATERNAL  SIDE                            5)     NO     PREVIOUS    H/O    HEAD INJURY.                                     NO   PRECIPITATING  FACTORS     SEIZURES    REPORTED                             6)        PREVIOUS     STROKE       IN    2017  WITH   HOSPITALIZATION    FOR  TWO  WEEKS                                        H/O     MILD     RESIDUAL     HEMIPARESIS                          7)      PREVIOUS     H/O   NEUROLOGY    EVALUATIONS      AT                                  [facility]                                BEING     FOLLOWED    BY    [facility]   FACILITY    AT   [place]                          8)       PATIENT     DENIES      ANY    TOBACCO ,  ALCOHOL                                    OR     ILLEGAL   DRUG   USAGE                      9)       WAS    TREATED      WITH    DEPAKOTE     IN    THE  PAST                        10)       H/O    CHRONIC    ANXIETY,   DEPRESSION ,  BIPOLAR  DISORDER                                                  FOR    MORE    THAN   13  YEARS                             -      WAS       ON    EFFEXOR,                               CURRENTLY    ON    SEROQUEL,  CLONIDINE                                  BEING        FOLLOWED  WITH    MENTAL  HEALTH  PROFESSIONALS                          11)      CO  MORBID  MULTIPLE  MEDICAL   CONDITION                                         TO  FOLLOW  WITH    HIS  PCP                      12)       H/O    CHRONIC     MILD    MEMORY  PROBLEMS                     13)      H/O     CHRONIC    MILD  BRIEF    DIZZINESS                                      INTERMITTENTLY     FOR   TWO   YEARS                       14)   H/O    MILD     BALANCE  PROBLEMS                                                            PREVIOUS     H/O   BRIEF     FALLING                                 PATIENT  USES   CANE  AS  NEEDED                                            15)       HAD    NEURO  DIAGNOSTIC  EVALUATIONS  IN   DEC  2021/  JAN. 2022                                              A)   MRI  BRAIN,   CAROTID  DOPPLER                                     NO    SIGNIFICANT  ABNORMALITIES                                B)    TCD        SHOWED  mild-to-moderate right middle cerebral artery stenosis.  ??                              mild-to-moderate diffuse intracranial small vessel  disease  ??    ??                      C)     LABS     INCLUDING    HYPER COAGULABLE   WORK                                     SHOWED       SHOWED    NO  SIGNIFICANT  ABNORMALITIES                             D)         EEG     SHOWED                                       mild-to-moderate irritable diffuse cerebral dysfunction                             E)     EMG / NC  STUDIES   SHOWED                                        SENSORY PERIPHERAL  POLYNEUROPATHY                                           MORE  SO   BOTH  LOWER   EXTREMITIES                                    -    ABOVE   RESULTS    REVIEWED     AND  DISCUSSED    WITH  PATIENT                          16)       H/O     INTOLERANCE   TO   NSAID     MEDICATION                     17)     RECOMMENDATIONS  :                                 A)    FALL PRECAUTIONS                              B)   SEIZURE  PRECAUTIONS  C)    INCREASE   DEPAKOTE    500   MG    PO  BID                                           FOR   MIGRAINE    AND   SEIZURE    MANAGEMENT                             D)     ASA    81   MG  PO    DAILY                                E)     IMITREX      AND    FIORICET      AS   NEEDED                                             FOR   MIGRAINES  HEADACHES                                 --   REVIEWED     AND  DISCUSSED    WITH  PATIENT                                                          ??    ??               18)       VARIOUS  RISK   FACTORS   WERE  REVIEWED   AND   DISCUSSED.                       PATIENT   HAS  MULTIPLE   MEDICAL, NEUROLOGICAL                        AND   MENTAL HEALTH   PROBLEMS .                    PATIENT'S   MANAGEMENT  IS  CHALLENGING.                                        PRECIPITATING  FACTORS: including  fever/infection, exertion/relaxation, position change, stress,                weather change,   medications/alcohol, time of day/darkness/light  Are  present  MODIFYING  FACTORS:  fever/infection, exertion/relaxation, position change, stress, weather change,               medications/alcohol, time of day/darkness/light  Are  present                Patient   Indicates   The  Presence   And  The  Absence  Of  The  Following    Associated  And             Additional  Neurological    Symptoms:                                Balance  And coordination   problems  present           Gait problems     present            Headaches      present              Migraines           present           Memory problemspresent              Confusion        absent            Paresthesia   numbness          present           Seizures  And  Starring  Episodes           present           Syncope,  Near  syncopal episodes         absent           Speech   problems           absent             Swallowing   Problems      absent            Dizziness,  Light headedness           present              Vertigo        absent             Generalized   Weakness    absent              focal  Weakness     present             Tremors         absent              Sleep  Problems     present             History  Of   Recent  Head  Injury     absent             History  Of   Recent  TIA     absent             History  Of   Recent    Stroke     absent             Neck  Pain   and   Neck muscle  Spasms  present  Radiating  down   And   Weakness           present            Lower back   Pain  And     Spasms  present              Radiating    Down   And   Weakness          present                H/OFALLS        present               History  Of   Visual  Symptoms    absent                  Associated   Diplopia       absent                                               Also   Additional   Symptoms   Present    As  Documented    In   The   detailed                  Review  Of  Systems   And    Please   Refer   To    Them for   Additional    Information.                    Any  components  That are either  Unobtainable  Or  Limited  In   HPI, ROS  And/or PFSH   Are                   Due   ToPatient's  Medical  Problems,  Clinical  Condition   and/or lack of                                 other    Alternate   resources.            RECORDS   REVIEWED:    historical medical records           INFORMATION   REVIEWED:     MEDICAL   HISTORY,SURGICAL   HISTORY,     MEDICATIONS   LIST,   ALLERGIES AND  DRUG  INTOLERANCES,       FAMILY   HISTORY,  SOCIAL  HISTORY,      PROBLEM  LIST   FOR  PATIENT  CARE   COORDINATION          Past Medical History:   Diagnosis Date   ??? Anxiety    ??? Cerebral artery occlusion with cerebral infarction (HCC) 2017   ??? GERD (gastroesophageal reflux disease)    ??? Glaucoma    ??? Personality disorder (HCC)    ??? PTSD (post-traumatic stress disorder)    ??? PUD (peptic ulcer disease)    ??? Seizures (HCC)          Past Surgical History:   Procedure Laterality Date   ??? TONSILLECTOMY           Current Outpatient Medications   Medication Sig  Dispense Refill   ??? divalproex (DEPAKOTE) 500 MG DR tablet ONE  TABLET    AM    AND  ONE    TABLET   PM    DAILY 60 tablet 3   ??? SUMAtriptan (IMITREX) 100 MG tablet Take 1 tablet by mouth once as needed for Migraine 12 tablet 2   ??? butalbital-acetaminophen-caffeine (ESGIC) 50-325-40 MG per tablet ONE  TO  TWO  TABLETS  IN  THE  EVENING  AND  BED TIME  AS  NEEDED 60 tablet 1   ??? cloNIDine (CATAPRES) 0.1 MG tablet TAKE ONE TABLET BY MOUTH AT BEDTIME FOR BLOOD PRESSURE     ??? QUEtiapine (SEROQUEL) 25 MG tablet TAKE ONE TABLET BY MOUTH AT BEDTIME FOR 3 DAYS, THEN TAKE TWO TABLETS AT BEDTIME     ??? venlafaxine (EFFEXOR XR) 37.5 MG extended release capsule TAKING 225MG  DAILY     ??? Multiple Vitamins-Minerals (MULTIVITAMINS/MINERALS ADULT PO) TAKE 1 TABLET BY MOUTH ONCE DAILY FOR SUPPLEMENTATION     ??? omeprazole (PRILOSEC) 20 MG delayed release capsule Take 20 mg by mouth 2 times daily       No current facility-administered medications for this visit.          Allergies   Allergen Reactions   ??? Other      blueberries, raspberries, blackberries. Vaccination reaction.   ??? Penicillins    ??? Sulfamethoxazole-Trimethoprim Nausea Only         History reviewed. No pertinent family history.      Social History     Socioeconomic History   ??? Marital status: Married     Spouse name: Not on file   ??? Number of children: Not on file   ??? Years of education: Not on file   ??? Highest education level: Not on file   Occupational History   ??? Not on file   Tobacco Use   ??? Smoking status: Never Smoker   ??? Smokeless tobacco: Never Used   Vaping Use   ??? Vaping Use: Never used   Substance and Sexual Activity   ??? Alcohol use: Not Currently     Comment: 1 drink per year   ??? Drug use: No   ??? Sexual activity: Yes     Partners: Female   Other Topics Concern   ??? Not on file   Social History Narrative   ??? Not on file     Social Determinants of Health     Financial Resource Strain:    ??? Difficulty of Paying Living Expenses: Not on file   Food Insecurity:    ??? Worried About Running Out of Food in the Last Year: Not on file   ??? Ran Out of Food in the Last Year: Not on file   Transportation Needs:    ??? Lack of Transportation (Medical): Not on file   ??? Lack of Transportation (Non-Medical): Not on file   Physical Activity:    ??? Days of Exercise per Week: Not on file   ??? Minutes of Exercise per Session: Not on file   Stress:    ??? Feeling of Stress : Not on file   Social Connections:    ??? Frequency of Communication with Friends and Family: Not on file   ??? Frequency of Social Gatherings with Friends and Family: Not on file   ??? Attends Religious Services: Not on file   ??? Active Member of Clubs or Organizations: Not on file   ??? Attends Club  or Organization Meetings: Not on file   ??? Marital Status: Not on file   Intimate Partner Violence:    ??? Fear of Current or Ex-Partner: Not on file   ??? Emotionally Abused: Not on file   ??? Physically Abused: Not on file   ??? Sexually Abused: Not on file   Housing Stability:     ??? Unable to Pay for Housing in the Last Year: Not on file   ??? Number of Places Lived in the Last Year: Not on file   ??? Unstable Housing in the Last Year: Not on file       Vitals:    12/20/20 1121   BP: 112/76   Pulse: 98   SpO2: 96%         Wt Readings from Last 3 Encounters:   12/20/20 242 lb (109.8 kg)   12/15/20 235 lb (106.6 kg)   10/09/20 244 lb 3.2 oz (110.8 kg)         BP Readings from Last 3 Encounters:   12/20/20 112/76   12/16/20 127/72   10/09/20 102/72           Hematology and Coagulation    Lab Results   Component Value Date    WBC 7.5 12/15/2020    RBC 5.26 12/15/2020    HGB 15.2 12/15/2020    HCT 45.1 12/15/2020    MCV 85.7 12/15/2020    MCH 28.9 12/15/2020    MCHC 33.7 12/15/2020    RDW 12.6 12/15/2020    PLT 278 12/15/2020    MPV 9.9 12/15/2020         Chemistries    Lab Results   Component Value Date    NA 142 12/15/2020    K 3.7 12/15/2020    CL 108 12/15/2020    CO2 25 12/15/2020    BUN 9 12/15/2020    CREATININE 0.79 12/15/2020    CALCIUM 9.2 12/15/2020    PROT 6.6 06/22/2016    LABALBU 3.8 06/22/2016    BILITOT 0.2 06/22/2016    ALKPHOS 74 06/22/2016    AST 14 06/22/2016    ALT 13 06/22/2016     Lab Results   Component Value Date    ALKPHOS 74 06/22/2016    ALT 13 06/22/2016    AST 14 06/22/2016    PROT 6.6 06/22/2016    BILITOT 0.2 06/22/2016    LABALBU 3.8 06/22/2016     Lab Results   Component Value Date    BUN 9 12/15/2020    CREATININE 0.79 12/15/2020     Lab Results   Component Value Date    CALCIUM 9.2 12/15/2020     Lab Results   Component Value Date    AST 14 06/22/2016    ALT 13 06/22/2016         Review of Systems   Constitutional: Positive for diaphoresis. Negative for appetite change, chills, fatigue, fever, unexpected weight change and weight loss.   HENT: Negative for congestion, dental problem, drooling, ear discharge, ear pain, facial swelling, hearing loss, mouth sores, nosebleeds, postnasal drip, rhinorrhea, sinus pressure, sore throat, tinnitus, trouble swallowing and  voice change.    Eyes: Positive for blurred vision and photophobia. Negative for pain, discharge, redness, itching and visual disturbance.   Respiratory: Negative for apnea, cough, choking, chest tightness, shortness of breath and wheezing.    Cardiovascular: Negative for chest pain, palpitations and leg swelling.   Gastrointestinal: Negative for abdominal distention, abdominal pain, anorexia, blood  in stool, change in bowel habit, constipation, diarrhea, nausea and vomiting.   Endocrine: Negative for cold intolerance, heat intolerance, polydipsia, polyphagia and polyuria.   Musculoskeletal: Positive for back pain, gait problem and neck pain. Negative for arthralgias, joint swelling, myalgias and neck stiffness.   Skin: Negative for color change, pallor, rash and wound.   Allergic/Immunologic: Negative for environmental allergies, food allergies and immunocompromised state.   Neurological: Positive for dizziness, tingling, weakness, light-headedness, numbness and loss of balance. Negative for vertigo, tremors, seizures, syncope, facial asymmetry, speech difficulty and headaches.   Hematological: Negative for adenopathy. Does not bruise/bleed easily.   Psychiatric/Behavioral: Positive for decreased concentration and sleep disturbance. Negative for agitation, behavioral problems, confusion, dysphoric mood, hallucinations, self-injury and suicidal ideas. The patient is nervous/anxious and has insomnia. The patient is not hyperactive.          OBJECTIVE:      Physical Exam  Constitutional:       Appearance: He is well-developed.   HENT:      Head: Normocephalic and atraumatic. No raccoon eyes or Battle's sign.      Right Ear: External ear normal.      Left Ear: External ear normal.      Nose: Nose normal.   Eyes:      Conjunctiva/sclera: Conjunctivae normal.      Pupils: Pupils are equal, round, and reactive to light.   Neck:      Thyroid: No thyroid mass or thyromegaly.      Vascular: No carotid bruit.      Trachea: No  tracheal deviation.      Meningeal: Brudzinski's sign and Kernig's sign absent.   Cardiovascular:      Rate and Rhythm: Normal rate and regular rhythm.   Pulmonary:      Effort: Pulmonary effort is normal.   Musculoskeletal:         General: No tenderness.      Cervical back: Normal range of motion and neck supple. No rigidity. No muscular tenderness. Normal range of motion.   Skin:     General: Skin is warm.      Coloration: Skin is not pale.      Findings: No erythema or rash.      Nails: There is no clubbing.   Psychiatric:         Attention and Perception: He is attentive.         Mood and Affect: Mood is not anxious or depressed. Affect is not labile, blunt or inappropriate.         Speech: He is communicative. Speech is not rapid and pressured, delayed, slurred or tangential.         Behavior: Behavior is not agitated, slowed, aggressive, withdrawn, hyperactive or combative. Behavior is cooperative.         Thought Content: Thought content is not paranoid or delusional. Thought content does not include homicidal or suicidal ideation. Thought content does not include homicidal or suicidal plan.         Cognition and Memory: Memory is impaired. He exhibits impaired recent memory. He does not exhibit impaired remote memory.         Judgment: Judgment is not impulsive or inappropriate.               NEUROLOGICALEXAMINATION :      A) MENTAL STATUS:                   Alert and  oriented  To  time, place  And  Person.                  No Aphasia.  No  Dysarthria.                    Able   To  Follow     SIMPLE    commands   without   Any  Difficulty.                   No right  To left confusion.                  Normal  Speech  And language function.                   Insight and  Judgment ,Fund  Of  Knowledge   within normal limits                Recent  And  Remote memory    DECREASED                Attention &  Concentration are        DECREASED                                            B) CRANIAL NERVES :         CN : Visual  Acuity  And  Visual fields  within normal limits               Fundi  Could  Not  Be  Could  Not  Be  Evaluated.           3,4,6 CN : Both  Pupils are   Reactive and  Equal.  Movements  Are  Intact.                             No  Nystagmus.  No  INO.  No  Afferent  Pupillary  Defect noted.          5 CN :  Normal  Facial sensations and Corneal  Reflexes           7 CN:  Normal  Facial  Symmetry  And  Strength.  No facial  Weakness.           8 CN :  Hearing  Appears within normal limits          9, 10 CN: Normal   spontaneous, reflex   palate   movements         11 CN:   Normal  Shoulder  shrug and  strength         12 CN :   Normal  Tongue movements and  Tongue  In midline                        No tongue   Fasciculations or atrophy         C) MOTOR  EXAM:                   Strength  In upper  And  Lower   extremities     DECREASED  MILD   RESIDUAL  LEFT  HEMIPARESIS                             DECREASED   HAND  GRIPS                   No  Drift.     No  Atrophy               Rapid   alternating  And  repetitions  Movements   DECREASED                 Muscle  Tone  In upper  And  Lower  Extremities  normal                No rigidity.  No  Spasticity.                 Bradykinesia   absent                 No  Asterixis.              Sustention  Tremor , Resting   Tremor   absent                    No   other  Abnormal  Movements noted           D) SENSORY :               Light   touch, pinprick,   position  And  Vibration    DECREASED      E) REFLEXES:                   Deep  Tendon  Reflexes    DECREASED                                              F) COORDINATION  AND  GAIT :                                Station and  Gait    SLOW     WITH  MILD  LIMP                           Romberg 's test    POSITIVE                            Ataxia negative          ASSESSMENT:        Patient Active Problem List   Diagnosis   ??? 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 Bipolar II disorder, most recent episode major depressive (HCC)   ??? Borderline personality disorder (HCC)   ??? Seizure disorder (HCC)   ??? Headache   ??? Insomnia   ??? Low back pain   ??? Lumbar spondylosis   ??? Migraine without aura and without status migrainosus, not intractable   ??? Obstructive sleep apnea syndrome in adult   ??? Pituitary cyst (HCC)   ??? Restless legs   ??? Sleep apnea   ??? H/O:  stroke   ??? Chronic tension-type headache, intractable   ??? Memory problem   ??? Dizziness   ??? Chronic neck pain   ??? Abnormal EEG   ??? Chronic cerebral ischemia   ??? Middle cerebral artery stenosis, right       MRI OF THE BRAIN WITHOUT CONTRAST ??10/23/2020 8:32 am   ??   TECHNIQUE:   Multiplanar multisequence MRI of the brain was performed without the   administration of intravenous contrast.   ??   COMPARISON:   None.   ??   HISTORY:   ORDERING SYSTEM PROVIDED HISTORY: Bipolar II disorder, most recent episode   major depressive (HCC)   TECHNOLOGIST PROVIDED HISTORY:   migraine   ??   Initial evaluation.   ??   FINDINGS:   INTRACRANIAL STRUCTURES/VENTRICLES: There is no acute infarct. No mass effect   or midline shift. No evidence of an acute intracranial hemorrhage. ??The   ventricles and sulci are normal in size and configuration. ??The   sellar/suprasellar regions appear unremarkable. ??The normal signal voids   within the major intracranial vessels appear maintained.   ??   ORBITS: The visualized portion of the orbits demonstrate no acute abnormality.   ??   SINUSES: Mucous retention cysts seen in the left maxillary sinus. ??The   mastoid air cells demonstrate no acute abnormality.   ??   BONES/SOFT TISSUES: The bone marrow signal intensity appears normal. The soft   tissues demonstrate no acute abnormality.   ??   ??   Impression   No acute intracranial abnormality. ??No acute infarct.   ??   ??       VISITING DIAGNOSIS:      ICD-10-CM    1. Migraine without aura and without status migrainosus, not intractable  G43.009 divalproex  (DEPAKOTE) 500 MG DR tablet   2. 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 Low back pain with sciatica, sciatica laterality unspecified, unspecified back pain laterality, unspecified chronicity  M54.40 divalproex (DEPAKOTE) 500 MG DR tablet   6. Obstructive sleep apnea syndrome in adult  G47.33 divalproex (DEPAKOTE) 500 MG DR tablet   7. Borderline personality disorder (HCC)  F60.3 divalproex (DEPAKOTE) 500 MG DR tablet   8. Other sleep apnea  G47.39 divalproex (DEPAKOTE) 500 MG DR tablet   9. Seizure disorder (HCC)  G40.909 divalproex (DEPAKOTE) 500 MG DR tablet   10. H/O: stroke  Z86.73 divalproex (DEPAKOTE) 500 MG DR tablet   11. Chronic tension-type headache, intractable  G44.221 divalproex (DEPAKOTE) 500 MG DR tablet   12. Memory problem  R41.3 divalproex (DEPAKOTE) 500 MG DR tablet   13. Dizziness  R42 divalproex (DEPAKOTE) 500 MG DR tablet   14. Chronic neck pain  M54.2 divalproex (DEPAKOTE) 500 MG DR tablet    G89.29    15. Abnormal EEG  R94.01    16. Chronic cerebral ischemia  I67.82    17. 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          *  PATIENT   HAS  MULTIPLE   MEDICAL, NEUROLOGICAL                        AND   MENTAL HEALTH   PROBLEMS .           PATIENT'S    MANAGEMENT  IS  CHALLENGING.            PLAN:                         Ashby Dawes  Of  The  Diagnoses,  The  Management & Treatment  Options            AND    Care  plan  Were          Reviewed and   Discussed   With  patient.           * Goals  And  Expectations  Of  The  Therapy  Discussed   And  Reviewed.          *   Benefits   And   Side  Effect  Profile  Of  Medication/s   Were   Discussed                * Need   For  Further   Follow up For  The  Various  Problems Were  discussed.          * Results  Of  The  Previous  Diagnostic tests were reviewed and  discussed                   Medical  Decision  Making  Was  Made  Based on the   Complexity  Of  Above  Mentioned  Diagnoses,    Data reviewed             And    Risk  Of  Significant   Co morbidities and   complicating   Factors.                 Medical  Decision  Was     High    Complexity   Due   To  The  Patient's  Multiple  Symptoms  &  Disease,            Complex  Treatment  Regimen,  Multiple medications           and   Risk  Of   Side  Effects,  Difficulty  In  Medication  Management  And  Diagnostic  Challenges           In  View  Of  The  Associated   Co  Morbid  Conditions   And  Problems.                           * FALL   PRECAUTIONS.      THESE  REVIEWED   AND  DISCUSSED      *  USE   WALKING  ASSISTANCE  DEVICES     QUAD  CANE        *   BE  CAREFUL  WITH  ACTIVITIES           *   AVOID   NECK  AND/ BACK  STRAINING  ACTIVITIES            *   ADEQUATE   FLUID  INTAKE   AND  ELECTROLYTE  BALANCE           * KEEP  DAIRY  OF   THE  NEUROLOGICAL  SYMPTOMS        RECORDING THE    DURATION  AND  FREQUENCY.          *  AVOID    CONDITIONS  AND  FACTORS   THAT  MAKE                  NEUROLOGICAL  SYMPTOMS  WORSE.        *   SEIZURE  PRECAUTIONS.                 A)  Avoid  Working  At   [employer].          B)  Avoid  Working  With  Engineer, manufacturing systems.          C)  Avoid   Swimming,  Climbing  A  Ladder   Unattended.          D)  Avoid   Driving   If  You   Have   A  Seizure.          E)  Must   Be  Seizure  Free   For  At   Least   6 months,  Before   You  Can drive.          F) Some times  Your  May  Feel  Seizure coming  Before  It  Begins.                You  May feelsmell or funny  Feeling  In  Your  Stomach,  Which is  Called   Aura.                     TIPS  TO  REDUCE/ PREVENT  SEIZURES           1.  Take  Your  Anti seizure  Medications   As   Recommended.       2.  Get   Enough   Sleep.   Sleep  Deprivation   Can  Trigger  A  Seizure.       3.  If   You   Have  A fever,  Treat  It  At  Once,  And  Contact   Your  Primary  Care Providers.  4. Avoid   Alcohol.       5. Avoid  Flashing  Lights,  Loud  Noises and  TV  And  Video  Games,             As   These  May  Trigger   Your  Seizures       6.  Control  Your  Stress  And   Have  Adequate  Rest.       7.   If  You  Feel  A  Seizure  Coming On :             A) warn people  Who  Are  With  You             B)  Make  Sure  There  Are  No  Sharp or  Hard  Objects  Around you.             C)  Lay down  On  Your  Side  And  Relax.                  *TO  MAINTAIN  REGULAR  SLEEP  WAKE  CYCLES.     *   TO  HAVE  ADEQUATE  REST  AND   SLEEP    HOURS.            *    AVOID  ANY USAGE OF    TOBACCO,          AVOID  EXCESSIVE  ALCOHOL  AND   ILLEGAL   SUBSTANCES          *  CONTINUE   MEDICATIONS    PRESCRIBED       AS    RECOMMENDED       *   Compliance   With  Medications   And  Instructions            *    MIGRAINE/ HEADACHE    DAIRY   WITH  MONITORING                       OF  DURATION  AND  FREQUENCY.        *  May   Use  Pill  Box,    If  Needed        *  MEDICATIONS TO AVOID:    WELLBUTRIN,  ULTRAM            *    Antiplatelet  therapy    As   Recommended  Was   Discussed      *    Prophylactic  Use   Of     Vitamin   B   Complex,  Folic  Acid,    Vitamin  B12    Multivitamin,       Calcium  With   magnesium  And  Vit D    Supplementations   Over  The  Counter  Discussed               *  PATIENT  IS  ALSO   COUNSELED   TO  KEEP    ACTIVITIES:         A)   SIMPLE      B)  ORGANIZED      C)  WRITEDOWN                     *  EVALUATIONS  AND  FOLLOW UP:                   * PHYSICAL  THERAPY                                 *CARDIOLOGY              *   OPTHALMOLOGY                                       *       RECOMMENDATIONS  :                                 A)    FALL PRECAUTIONS                              B)   SEIZURE  PRECAUTIONS                             C)    INCREASE   DEPAKOTE    500   MG    PO  BID                                           FOR   MIGRAINE    AND   SEIZURE    MANAGEMENT                             D)     ASA    81   MG  PO    DAILY                                E)     IMITREX      AND    FIORICET      AS   NEEDED                                             FOR   MIGRAINES  HEADACHES                                 --   REVIEWED     AND  DISCUSSED    WITH  PATIENT                              Orders Placed This Encounter   Medications   ??? divalproex (DEPAKOTE) 500 MG DR tablet     Sig: ONE  TABLET    AM    AND  ONE    TABLET   PM    DAILY     Dispense:  60 tablet     Refill:  3   ??? SUMAtriptan (  IMITREX) 100 MG tablet     Sig: Take 1 tablet by mouth once as needed for Migraine     Dispense:  12 tablet     Refill:  2   ??? butalbital-acetaminophen-caffeine (ESGIC) 50-325-40 MG per tablet     Sig: ONE  TO  TWO  TABLETS  IN  THE  EVENING  AND  BED TIME  AS  NEEDED     Dispense:  60 tablet     Refill:  1               *PATIENT   TO  FOLLOW  UP  WITH   PRIMARY  CARE         OTHER  CONSULTANTS  AS  BEFORE.               *TO  FOLLOW  WITH   MENTAL  HEALTH  PROFESSIONALS ,  INCLUDING            PSYCHOLOGICAL  COUNSELING   AND  PSYCHIATRIC  EVALUATIONS                    --    REVIEWED     AND  DISCUSSED    WITH  PATIENT                         *  Maintain   Healthy  Life Style    With   Periodic   Monitoring  Of          Any  Medical  Conditions  Including   Elevated  Blood  Pressure,  Lipid  Profile,        Blood  Sugar levels  AndHeart  Disease.                *   Period   Screening  For  Cancers  Involving  Breast,  Colon,         Lungs  And  Other  Organs  As  Applicable,  In consultation   With  Your  Primary Care Providers.               *Second  Neurological  Opinion  And  Evaluations  In  [facility]  Setting  If  Patient  Is  Interested.                 * Please   Contact   Neurology  Clinic   Early   If   Are  Any  New  Neurological   And  Any neurological  Concerns.                   *  If  The  Patient remains  Neurologically  Stable   Return   To  [facility] Neurology Department   IN      2 - 3        MONTHS  TIME   FOR  FURTHER              FOLLOW UP.                       *   The  Neurological   Findings,  Possible  Diagnosis,  Differential diagnoses  And  Options  For    Further   Investigations                   And  management   Are  Discussed  Comprehensively.                    Medications   And  Prescription   Risks  And  Side effects  Are   Also  Discussed.                     *  If   There is  Any  Significant  Worsening   Of  Current  Symptoms  And  Or                  If patient  Develops   Any additional  New  NeurologicalSymptoms                  Or  Significant  Concerns   Should  Call  911 or  Go  To  Emergency  Department                  For  Further  Immediate  Evaluation and  management .                         The   Above  Were  Reviewed  With  PATIENT   and                         questions  Answered  In  Detail.                                         Electronically signed by   Luiz Iron,  M.D., [place].     Board Certified in  Neurology &  In  Brain Injury Medicine   American Board of Psychiatry and Neurology (ABPN)      DISCLAIMER:   Although every effort was made to ensure the accuracy of this   electronictranscription, some errors in transcription may have occurred.      GENERAL PATIENT INSTRUCTIONS:     ??? A Healthy Lifestyle: Care Instructions  ??? Your Care Instructions  ??? A healthy lifestyle can help you feel good, stay at ahealthy weight, and have plenty of energy for both work and play. A healthy lifestyle is something you can share with your whole family.  ??? A healthy lifestyle also can lower your risk for serious health problems, such ashigh blood pressure, heart disease, and diabetes.  ??? You can follow a few steps listed below to improve your health and the health of your family.  ??? Follow-up careis a key part of your treatment and safety. Be sure to make and go to all appointments, and call your doctor if you are having problems. It???s also a good idea to know your test results and keep a list of the medicines you take.  ??? How can you care for yourself at home?  ??? Do not eat too much sugar, fat, or fast foods. You can still have dessert and treats nowand then. The goal is moderation.  ??? Start small to improve your eating habits. Pay attention to portion sizes, drink less juice and soda  pop, and eat more fruits and vegetables.  ??? Eat a healthy amount of food. A 3-ounce serving of meat, for example, is about the size of a deck of cards. Fill the rest of your plate with vegetables and whole grains.  ??? Limit theamount of soda and sports drinks you have every day. Drink more water when you are thirsty.  ??? Eat at least 5 servings of fruits and vegetables every day. It may seem like a lot, but it is not hard to reach this goal. Aserving or helping is 1 piece of fruit, 1 cup of vegetables, or 2 cups of leafy, raw vegetables. Have an apple or some carrot sticks as an afternoon snack instead of a candy bar. Try to have fruits and/or vegetables at everymeal.  ??? Make exercise part of your daily routine. You may want to start with simple activities, such as walking, bicycling, or slow swimming. Try tobe active 30  to 60 minutes every day. You do not need to do all 30 to 60 minutes all at once. For example, you can exercise 3 times a day for 10 or 20 minutes. Moderate exercise is safe for most people, but it is always agood idea to talk to your doctor before starting an exercise program.  ??? Keep moving. Mow the lawn, work in the garden, or [employer]. Take the stairs instead of the elevator at work.  ??? If you smoke, quit. Peoplewho smoke have an increased risk for heart attack, stroke, cancer, and other lung illnesses. Quitting is hard, but there are ways to boost your chance of quitting tobacco for good.  ??? Use nicotine gum, patches, or lozenges.  ??? Ask your doctor about stop-smoking programs and medicines.  ??? Keep trying.  ??? In addition to reducing your risk of diseases in the future, you will notice some benefits soon after you stop using tobacco. If you have shortness of breath or asthma symptoms, they will likely getbetter within a few weeks after you quit.  ??? Limit how much alcohol you drink. Moderate amounts of alcohol (up to 2 drinks a day for men, 1drink a day for women) are okay. But drinking too much can lead to liver problems, high blood pressure, and other health problems.  ??? health  ??? If you have a family, there are many things you can do together to improve your health.  ??? Eat meals together as a family as often as possible.  ??? Eat healthy foods. This includes fruits, vegetables, lean meats and dairy, and whole grains.  ??? Include your family in your fitness plan. Most peoplethink of activities such as jogging or tennis as the way to fitness, but there are many ways you and your family can be more active. Anything that makes you breathe hard and gets your heart pumping is exercise. Here are sometips:  ??? Walk to do errands or to take your child to school or the bus.  ??? Go for a family bike ride after dinner instead of watching TV.  ??? Where can you learn more?  ??? Go [email or web address]  and sign in to your MyChart account. Enter [phone] in the Search HealthInformation box to learn more about "A Healthy Lifestyle: Care Instructions."     If you do not have anaccount, please click on the "Sign Up Now" link.  ??? Current as of: June 20, 2015  ??? Content Version: 11.2  ??? ?? 2006-2017 [employer]. Care instructions adapted under license by [facility].  If you have questions about a medical condition or this instruction, always ask your healthcare professional. [employer] disclaims any warranty or liability for your use of this information.

## 2020-12-22 NOTE — Telephone Encounter (Signed)
OK  WITH  ME.      THANK YOU.

## 2020-12-22 NOTE — Telephone Encounter (Signed)
Larry Koch from [facility] called to see if she could get a new prescription for Depakote Extended Release.  Her pharmacy does not have Depakote Delayed.    [phone]

## 2020-12-22 NOTE — Telephone Encounter (Signed)
Pharmacy notified and verbally acknowledged understanding.

## 2020-12-25 NOTE — Telephone Encounter (Signed)
[facility] representitve contacted this Clinical research associate, stated that PA for Fioricet has been denied even after appeal - suggesting that patient try Excedrin migraine (PA not needed).    Please advise, thank you.    Last Appt:  12/20/2020  Next Appt:   02/21/2021  Med verified in Epic

## 2020-12-26 NOTE — Telephone Encounter (Signed)
This Clinical research associate contacted patient and notified of below - patient voiced understanding, will contact office with further needs.

## 2020-12-26 NOTE — Telephone Encounter (Signed)
PLEASE  NOTIFY   THE   PATIENT.      THANK  YOU.

## 2021-02-21 ENCOUNTER — Ambulatory Visit: Payer: PRIVATE HEALTH INSURANCE | Attending: Neurology

## 2021-02-26 ENCOUNTER — Ambulatory Visit: Admit: 2021-02-26 | Discharge: 2021-02-26 | Payer: PRIVATE HEALTH INSURANCE | Attending: Neurology

## 2021-02-26 DIAGNOSIS — G40909 Epilepsy, unspecified, not intractable, without status epilepticus: Secondary | ICD-10-CM

## 2021-02-26 MED ORDER — SUMATRIPTAN SUCCINATE 100 MG PO TABS
100 MG | ORAL_TABLET | Freq: Once | ORAL | 2 refills | Status: AC | PRN
Start: 2021-02-26 — End: 2021-02-26

## 2021-02-26 MED ORDER — BUTALBITAL-APAP-CAFFEINE 50-325-40 MG PO TABS
50-325-40 MG | ORAL_TABLET | ORAL | 1 refills | Status: DC
Start: 2021-02-26 — End: 2022-10-31

## 2021-02-26 MED ORDER — DIVALPROEX SODIUM 500 MG PO TBEC
500 MG | ORAL_TABLET | ORAL | 3 refills | Status: AC
Start: 2021-02-26 — End: 2022-10-31

## 2021-02-26 NOTE — Progress Notes (Signed)
[facility]  Neurology    [address], [place]  [address]  Phone:[phone]   Fax: [phone]        SUBJECTIVE:       PATIENT ID:  Larry Koch is a  RIGHT    HANDED 40 y.o. male.      Migraine   This is a chronic problem. Episode onset: FOR   15  YEARS. The problem occurs intermittently. The problem has been waxing and waning. Associated symptoms include back pain, blurred vision, dizziness, insomnia, a loss of balance, neck pain, numbness, phonophobia, photophobia, tingling, a visual change and weakness. Pertinent negatives include no abdominal pain, abnormal behavior, anorexia, coughing, drainage, ear pain, eye pain, eye redness, eye watering, facial sweating, fever, hearing loss, muscle aches, nausea, rhinorrhea, scalp tenderness, seizures, sinus pressure, sore throat, swollen glands, tinnitus, vomiting or weight loss. The symptoms are aggravated by unknown. He has tried nothing for the symptoms. The treatment provided no relief.   Seizures  This is a chronic problem. Episode onset: SINCE    2015  The problem occurs intermittently (DURING     HIS  SLEEP  NIGHT  TIME  ). The problem has been waxing and waning. Associated symptoms include diaphoresis, neck pain, numbness, a visual change and weakness. Pertinent negatives include no abdominal pain, anorexia, arthralgias, change in bowel habit, chest pain, chills, congestion, coughing, fatigue, fever, headaches, joint swelling, myalgias, nausea, rash, sore throat, swollen glands, urinary symptoms, vertigo or vomiting. Associated symptoms comments: MEMORY   PROBLEMS  . Nothing aggravates the symptoms. He has tried nothing for the symptoms. The treatment provided no relief.   Cerebrovascular Accident  This is a chronic problem. Episode onset: SINCE   2017   The problem has been resolved. Associated symptoms include diaphoresis, neck pain, numbness, a visual change and weakness. Pertinent negatives include no abdominal pain,  anorexia, arthralgias, change in bowel habit, chest pain, chills, congestion, coughing, fatigue, fever, headaches, joint swelling, myalgias, nausea, rash, sore throat, swollen glands, urinary symptoms, vertigo or vomiting. Associated symptoms comments: RESIDUAL   LEFT   HEMIPARESIS  . Nothing aggravates the symptoms. He has tried nothing for the symptoms. The treatment provided no relief.             History obtained from  The   PATIENT         and other  available   medical  records   were  Also  reviewed.            The  Duration,  Quality,  Severity,  Location,  Timing,  Context,  Modifying  Factors   Of   The   Chief   Complaint       And  Present  Illness  Was   Reviewed   In   Chronological   [facility].                                            PATIENT'S  MAIN  CONCERNS INCLUDE :                       1)    KNOWN     H/O    MIGRAINES    AND  HEADACHES  FOR  MORE   THAN    15   YEARS                               2)     H/O      WORSENING  OF  MIGRAINES   AND    HEADACHES                                        ASSOCIATED     WITH   NAUSEA ,    VOMITING,  PHOTOPHOBIA                                          PAIN   IN    THE   LEFT    EYE                                                             3)          H/O    SEIZURES       DURING   NIGHT   TIME         SINCE                                          2015        AS    WITNESSED   BY    HIS   EX   WIFE                                4)        FAMILY     HISTORY    OF   SEIZURES                                         -   MATERNAL  SIDE                            5)     NO     PREVIOUS    H/O    HEAD INJURY.                                     NO   PRECIPITATING  FACTORS     SEIZURES    REPORTED                             6)        PREVIOUS     STROKE       IN    2017  WITH   HOSPITALIZATION    FOR  TWO  WEEKS                                        H/O     MILD     RESIDUAL     HEMIPARESIS                          7)      PREVIOUS     H/O   NEUROLOGY    EVALUATIONS      AT                                  [place]  [facility]                                BEING     FOLLOWED    BY    [facility]   FACILITY    AT   [place]                          8)       PATIENT     DENIES      ANY    TOBACCO ,  ALCOHOL                                    OR     ILLEGAL   DRUG   USAGE                      9)       WAS    TREATED      WITH    DEPAKOTE     IN    THE  PAST                        10)       H/O    CHRONIC    ANXIETY,   DEPRESSION ,  BIPOLAR  DISORDER                                                  FOR    MORE    THAN   13  YEARS                             -      WAS       ON    EFFEXOR,                               CURRENTLY    ON    SEROQUEL,  CLONIDINE                                  BEING        FOLLOWED  WITH    MENTAL  HEALTH  PROFESSIONALS                          11)      CO  MORBID  MULTIPLE  MEDICAL   CONDITION                                         TO  FOLLOW  WITH    HIS  PCP                      12)       H/O    CHRONIC     MILD    MEMORY  PROBLEMS                     13)      H/O     CHRONIC    MILD  BRIEF    DIZZINESS                                      INTERMITTENTLY     FOR   TWO   YEARS                       14)   H/O    MILD     BALANCE  PROBLEMS                                                            PREVIOUS     H/O   BRIEF     FALLING                                 PATIENT  USES   CANE  AS  NEEDED                                            15)       HAD    NEURO  DIAGNOSTIC  EVALUATIONS  IN   DEC  2021/  JAN. 2022                                              A)   MRI  BRAIN,   CAROTID  DOPPLER                                     NO    SIGNIFICANT  ABNORMALITIES                                B)    TCD        SHOWED  mild-to-moderate right middle cerebral artery stenosis.  ??                              mild-to-moderate diffuse intracranial small vessel  disease  ??    ??                      C)     LABS     INCLUDING    HYPER COAGULABLE   WORK                                     SHOWED       SHOWED    NO  SIGNIFICANT  ABNORMALITIES                             D)         EEG     SHOWED                                       mild-to-moderate irritable diffuse cerebral dysfunction                             E)     EMG / NC  STUDIES   SHOWED                                        SENSORY PERIPHERAL  POLYNEUROPATHY                                           MORE  SO   BOTH  LOWER   EXTREMITIES                                    -    ABOVE   RESULTS    REVIEWED     AND  DISCUSSED    WITH  PATIENT                          16)       H/O     INTOLERANCE   TO   NSAID     MEDICATION                         17)      H/O     WAKING  UP     3    EPISODES     ON  BATH   ROOM   FLOOR                                     IN     FEB.  /  MARCH     2022         WITH  DISTURBED      OBJECTS  IN   BATH  ROOM     AND                                           FURNITURE  IN    BED TIME    ROOM                                  D / D     INCLUDE                                       -    NOCTURNAL   SEIZURE   EPISODES                                         -    SLEEP   WALKING                         18)         RECOMMENDATIONS  :                                 A)    FALL PRECAUTIONS                              B)   SEIZURE  PRECAUTIONS                             C)    DEPAKOTE    500   MG    PO  BID                                           FOR   MIGRAINE    AND   SEIZURE    MANAGEMENT                             D)     ASA    81   MG  PO    DAILY                                E)     IMITREX      AND    FIORICET      AS   NEEDED                                             FOR   MIGRAINES  HEADACHES  F)    DEPAKOTE  LEVEL     AND  FOLLOW  UP   EEG                                --   REVIEWED     AND  DISCUSSED    WITH  PATIENT                                                           ??    ??               19)       VARIOUS  RISK   FACTORS   WERE  REVIEWED   AND   DISCUSSED.                       PATIENT   HAS  MULTIPLE   MEDICAL, NEUROLOGICAL                        AND   MENTAL HEALTH   PROBLEMS .                    PATIENT'S   MANAGEMENT  IS  CHALLENGING.                                        PRECIPITATING  FACTORS: including  fever/infection, exertion/relaxation, position change, stress,                weather change,   medications/alcohol, time of day/darkness/light  Are  present                                                          MODIFYING  FACTORS:  fever/infection, exertion/relaxation, position change, stress, weather change,               medications/alcohol, time of day/darkness/light  Are  present                Patient   Indicates   The  Presence   And  The  Absence  Of  The  Following    Associated  And             Additional  Neurological    Symptoms:                                Balance  And coordination   problems  present           Gait problems     present            Headaches      present              Migraines           present           Memory problemspresent  Confusion        absent            Paresthesia   numbness          present           Seizures  And  Starring  Episodes           present           Syncope,  Near  syncopal episodes         absent           Speech   problems           absent             Swallowing   Problems      absent            Dizziness,  Light headedness           present              Vertigo        absent             Generalized   Weakness    absent              focal  Weakness     present             Tremors         absent              Sleep  Problems     present             History  Of   Recent  Head  Injury     absent             History  Of   Recent  TIA     absent             History  Of   Recent    Stroke     absent             Neck  Pain   and   Neck muscle  Spasms   present               Radiating  down   And   Weakness           present            Lower back   Pain  And     Spasms  present              Radiating    Down   And   Weakness          present                H/OFALLS        present               History  Of   Visual  Symptoms    absent                  Associated   Diplopia       absent                                               Also   Additional   Symptoms   Present  As  Documented    In   The   detailed                  Review  Of  Systems   And    Please   Refer   To    Them for   Additional    Information.                    Any components  That are either  Unobtainable  Or  Limited  In   HPI, ROS  And/or PFSH   Are                   Due   ToPatient's  Medical  Problems,  Clinical  Condition   and/or lack of                                 other    Alternate   resources.            RECORDS   REVIEWED:    historical medical records           INFORMATION   REVIEWED:     MEDICAL   HISTORY,SURGICAL   HISTORY,     MEDICATIONS   LIST,   ALLERGIES AND  DRUG  INTOLERANCES,       FAMILY   HISTORY,  SOCIAL  HISTORY,      PROBLEM  LIST   FOR  PATIENT  CARE   COORDINATION          Past Medical History:   Diagnosis Date   ??? Anxiety    ??? Cerebral artery occlusion with cerebral infarction (HCC) 2017   ??? GERD (gastroesophageal reflux disease)    ??? Glaucoma    ??? Personality disorder (HCC)    ??? PTSD (post-traumatic stress disorder)    ??? PUD (peptic ulcer disease)    ??? Seizures (HCC)          Past Surgical History:   Procedure Laterality Date   ??? TONSILLECTOMY           Current Outpatient Medications   Medication Sig Dispense Refill   ??? SUMAtriptan (IMITREX) 100 MG tablet Take 1 tablet by mouth once as needed for Migraine 12 tablet 2   ??? divalproex (DEPAKOTE) 500 MG DR tablet ONE  TABLET    AM    AND  ONE    TABLET   PM    DAILY 60 tablet 3   ??? butalbital-acetaminophen-caffeine (ESGIC) 50-325-40 MG per tablet ONE  TO  TWO  TABLETS  IN  THE  EVENING  AND  BED TIME  AS  NEEDED  60 tablet 1   ??? cloNIDine (CATAPRES) 0.1 MG tablet TAKE ONE TABLET BY MOUTH AT BEDTIME FOR BLOOD PRESSURE     ??? QUEtiapine (SEROQUEL) 25 MG tablet TAKE ONE TABLET BY MOUTH AT BEDTIME FOR 3 DAYS, THEN TAKE TWO TABLETS AT BEDTIME     ??? venlafaxine (EFFEXOR XR) 37.5 MG extended release capsule TAKING 225MG  DAILY     ??? Multiple Vitamins-Minerals (MULTIVITAMINS/MINERALS ADULT PO) TAKE 1 TABLET BY MOUTH ONCE DAILY FOR SUPPLEMENTATION     ??? omeprazole (PRILOSEC) 20 MG delayed release capsule Take 20 mg by mouth 2 times daily       No current facility-administered medications for this visit.         Allergies  Allergen Reactions   ??? Other      blueberries, raspberries, blackberries. Vaccination reaction.   ??? Penicillins    ??? Sulfamethoxazole-Trimethoprim Nausea Only         History reviewed. No pertinent family history.      Social History     Socioeconomic History   ??? Marital status: Married     Spouse name: Not on file   ??? Number of children: Not on file   ??? Years of education: Not on file   ??? Highest education level: Not on file   Occupational History   ??? Not on file   Tobacco Use   ??? Smoking status: Never Smoker   ??? Smokeless tobacco: Never Used   Vaping Use   ??? Vaping Use: Never used   Substance and Sexual Activity   ??? Alcohol use: Not Currently     Comment: 1 drink per year   ??? Drug use: No   ??? Sexual activity: Yes     Partners: Female   Other Topics Concern   ??? Not on file   Social History Narrative   ??? Not on file     Social Determinants of Health     Financial Resource Strain:    ??? Difficulty of Paying Living Expenses: Not on file   Food Insecurity:    ??? Worried About Running Out of Food in the Last Year: Not on file   ??? Ran Out of Food in the Last Year: Not on file   Transportation Needs:    ??? Lack of Transportation (Medical): Not on file   ??? Lack of Transportation (Non-Medical): Not on file   Physical Activity:    ??? Days of Exercise per Week: Not on file   ??? Minutes of Exercise per Session: Not on file   Stress:     ??? Feeling of Stress : Not on file   Social Connections:    ??? Frequency of Communication with Friends and Family: Not on file   ??? Frequency of Social Gatherings with Friends and Family: Not on file   ??? Attends Religious Services: Not on file   ??? Active Member of Clubs or Organizations: Not on file   ??? Attends Banker Meetings: Not on file   ??? Marital Status: Not on file   Intimate Partner Violence:    ??? Fear of Current or Ex-Partner: Not on file   ??? Emotionally Abused: Not on file   ??? Physically Abused: Not on file   ??? Sexually Abused: Not on file   Housing Stability:    ??? Unable to Pay for Housing in the Last Year: Not on file   ??? Number of Places Lived in the Last Year: Not on file   ??? Unstable Housing in the Last Year: Not on file       Vitals:    02/26/21 1156   BP: 118/80   Pulse: 68   SpO2: 94%         Wt Readings from Last 3 Encounters:   02/26/21 242 lb (109.8 kg)   12/20/20 242 lb (109.8 kg)   12/15/20 235 lb (106.6 kg)         BP Readings from Last 3 Encounters:   02/26/21 118/80   12/20/20 112/76   12/16/20 127/72           Hematology and Coagulation    Lab Results   Component Value Date    WBC 7.5 12/15/2020  RBC 5.26 12/15/2020    HGB 15.2 12/15/2020    HCT 45.1 12/15/2020    MCV 85.7 12/15/2020    MCH 28.9 12/15/2020    MCHC 33.7 12/15/2020    RDW 12.6 12/15/2020    PLT 278 12/15/2020    MPV 9.9 12/15/2020         Chemistries    Lab Results   Component Value Date    NA 142 12/15/2020    K 3.7 12/15/2020    CL 108 12/15/2020    CO2 25 12/15/2020    BUN 9 12/15/2020    CREATININE 0.79 12/15/2020    CALCIUM 9.2 12/15/2020    PROT 6.6 06/22/2016    LABALBU 3.8 06/22/2016    BILITOT 0.2 06/22/2016    ALKPHOS 74 06/22/2016    AST 14 06/22/2016    ALT 13 06/22/2016     Lab Results   Component Value Date    ALKPHOS 74 06/22/2016    ALT 13 06/22/2016    AST 14 06/22/2016    PROT 6.6 06/22/2016    BILITOT 0.2 06/22/2016    LABALBU 3.8 06/22/2016     Lab Results   Component Value Date    BUN 9  12/15/2020    CREATININE 0.79 12/15/2020     Lab Results   Component Value Date    CALCIUM 9.2 12/15/2020     Lab Results   Component Value Date    AST 14 06/22/2016    ALT 13 06/22/2016         Review of Systems   Constitutional: Positive for diaphoresis. Negative for appetite change, chills, fatigue, fever, unexpected weight change and weight loss.   HENT: Negative for congestion, dental problem, drooling, ear discharge, ear pain, facial swelling, hearing loss, mouth sores, nosebleeds, postnasal drip, rhinorrhea, sinus pressure, sore throat, tinnitus, trouble swallowing and voice change.    Eyes: Positive for blurred vision and photophobia. Negative for pain, discharge, redness, itching and visual disturbance.   Respiratory: Negative for apnea, cough, choking, chest tightness, shortness of breath and wheezing.    Cardiovascular: Negative for chest pain, palpitations and leg swelling.   Gastrointestinal: Negative for abdominal distention, abdominal pain, anorexia, blood in stool, change in bowel habit, constipation, diarrhea, nausea and vomiting.   Endocrine: Negative for cold intolerance, heat intolerance, polydipsia, polyphagia and polyuria.   Musculoskeletal: Positive for back pain, gait problem and neck pain. Negative for arthralgias, joint swelling, myalgias and neck stiffness.   Skin: Negative for color change, pallor, rash and wound.   Allergic/Immunologic: Negative for environmental allergies, food allergies and immunocompromised state.   Neurological: Positive for dizziness, tingling, weakness, light-headedness, numbness and loss of balance. Negative for vertigo, tremors, seizures, syncope, facial asymmetry, speech difficulty and headaches.   Hematological: Negative for adenopathy. Does not bruise/bleed easily.   Psychiatric/Behavioral: Positive for decreased concentration and sleep disturbance. Negative for agitation, behavioral problems, confusion, dysphoric mood, hallucinations, self-injury and suicidal  ideas. The patient is nervous/anxious and has insomnia. The patient is not hyperactive.          OBJECTIVE:      Physical Exam  Constitutional:       Appearance: He is well-developed.   HENT:      Head: Normocephalic and atraumatic. No raccoon eyes or Battle's sign.      Right Ear: External ear normal.      Left Ear: External ear normal.      Nose: Nose normal.   Eyes:  Conjunctiva/sclera: Conjunctivae normal.      Pupils: Pupils are equal, round, and reactive to light.   Neck:      Thyroid: No thyroid mass or thyromegaly.      Vascular: No carotid bruit.      Trachea: No tracheal deviation.      Meningeal: Brudzinski's sign and Kernig's sign absent.   Cardiovascular:      Rate and Rhythm: Normal rate and regular rhythm.   Pulmonary:      Effort: Pulmonary effort is normal.   Musculoskeletal:         General: No tenderness.      Cervical back: Normal range of motion and neck supple. No rigidity. No muscular tenderness. Normal range of motion.   Skin:     General: Skin is warm.      Coloration: Skin is not pale.      Findings: No erythema or rash.      Nails: There is no clubbing.   Psychiatric:         Attention and Perception: He is attentive.         Mood and Affect: Mood is not anxious or depressed. Affect is not labile, blunt or inappropriate.         Speech: He is communicative. Speech is not rapid and pressured, delayed, slurred or tangential.         Behavior: Behavior is not agitated, slowed, aggressive, withdrawn, hyperactive or combative. Behavior is cooperative.         Thought Content: Thought content is not paranoid or delusional. Thought content does not include homicidal or suicidal ideation. Thought content does not include homicidal or suicidal plan.         Cognition and Memory: Memory is impaired. He exhibits impaired recent memory. He does not exhibit impaired remote memory.         Judgment: Judgment is not impulsive or inappropriate.               NEUROLOGICALEXAMINATION :      A) MENTAL  STATUS:                   Alert and  oriented  To time, place  And  Person.                  No Aphasia.  No  Dysarthria.                    Able   To  Follow     SIMPLE    commands   without   Any  Difficulty.                   No right  To left confusion.                  Normal  Speech  And language function.                   Insight and  Judgment ,Fund  Of  Knowledge   within normal limits                Recent  And  Remote memory    DECREASED                Attention &  Concentration are        DECREASED  B) CRANIAL NERVES :        CN : Visual  Acuity  And  Visual fields  within normal limits               Fundi  Could  Not  Be  Could  Not  Be  Evaluated.           3,4,6 CN : Both  Pupils are   Reactive and  Equal.  Movements  Are  Intact.                             No  Nystagmus.  No  INO.  No  Afferent  Pupillary  Defect noted.          5 CN :  Normal  Facial sensations and Corneal  Reflexes           7 CN:  Normal  Facial  Symmetry  And  Strength.  No facial  Weakness.           8 CN :  Hearing  Appears within normal limits          9, 10 CN: Normal   spontaneous, reflex   palate   movements         11 CN:   Normal  Shoulder  shrug and  strength         12 CN :   Normal  Tongue movements and  Tongue  In midline                        No tongue   Fasciculations or atrophy         C) MOTOR  EXAM:                   Strength  In upper  And  Lower   extremities     DECREASED                          MILD   RESIDUAL  LEFT  HEMIPARESIS                             DECREASED   HAND  GRIPS                   No  Drift.     No  Atrophy               Rapid   alternating  And  repetitions  Movements   DECREASED                 Muscle  Tone  In upper  And  Lower  Extremities  normal                No rigidity.  No  Spasticity.                 Bradykinesia   absent                 No  Asterixis.              Sustention  Tremor , Resting   Tremor   absent                    No    other  Abnormal  Movements noted           D) SENSORY :               Light   touch, pinprick,   position  And  Vibration    DECREASED      E) REFLEXES:                   Deep  Tendon  Reflexes    DECREASED                                              F) COORDINATION  AND  GAIT :                                Station and  Gait    SLOW     WITH  MILD  LIMP                           Romberg 's test    POSITIVE                            Ataxia negative          ASSESSMENT:        Patient Active Problem List   Diagnosis   ??? 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 Bipolar II disorder, most recent episode major depressive (HCC)   ??? Borderline personality disorder (HCC)   ??? Seizure disorder (HCC)   ??? Headache   ??? Insomnia   ??? Low back pain   ??? Lumbar spondylosis   ??? Migraine without aura and without status migrainosus, not intractable   ??? Obstructive sleep apnea syndrome in adult   ??? Pituitary cyst (HCC)   ??? Restless legs   ??? Sleep apnea   ??? H/O: stroke   ??? Chronic tension-type headache, intractable   ??? Memory problem   ??? Dizziness   ??? Chronic neck pain   ??? Abnormal EEG   ??? Chronic cerebral ischemia   ??? Middle cerebral artery stenosis, right       MRI OF THE BRAIN WITHOUT CONTRAST ??10/23/2020 8:32 am   ??   TECHNIQUE:   Multiplanar multisequence MRI of the brain was performed without the   administration of intravenous contrast.   ??   COMPARISON:   None.   ??   HISTORY:   ORDERING SYSTEM PROVIDED HISTORY: Bipolar II disorder, most recent episode   major depressive (HCC)   TECHNOLOGIST PROVIDED HISTORY:   migraine   ??   Initial evaluation.   ??   FINDINGS:   INTRACRANIAL STRUCTURES/VENTRICLES: There is no acute infarct. No mass effect   or midline shift. No evidence of an acute intracranial hemorrhage. ??The   ventricles and sulci are normal in size and configuration. ??The   sellar/suprasellar regions appear unremarkable. ??The normal signal voids   within the major intracranial vessels appear  maintained.   ??   ORBITS: The visualized portion of the orbits demonstrate no acute abnormality.   ??   SINUSES: Mucous retention cysts seen in the left maxillary sinus. ??The   mastoid air cells demonstrate no acute abnormality.   ??   BONES/SOFT  TISSUES: The bone marrow signal intensity appears normal. The soft   tissues demonstrate no acute abnormality.   ??   ??   Impression   No acute intracranial abnormality. ??No acute infarct.   ??   ??       VISITING DIAGNOSIS:      ICD-10-CM    1. Seizure disorder (HCC)  G40.909 divalproex (DEPAKOTE) 500 MG DR tablet     Valproic Acid Level, Total     EEG   2. 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 Low back pain with sciatica, sciatica laterality unspecified, unspecified back pain laterality, unspecified chronicity  M54.40 divalproex (DEPAKOTE) 500 MG DR tablet   6. Obstructive sleep apnea syndrome in adult  G47.33 divalproex (DEPAKOTE) 500 MG DR tablet   7. Borderline personality disorder (HCC)  F60.3 divalproex (DEPAKOTE) 500 MG DR tablet   8. Other sleep apnea  G47.39 divalproex (DEPAKOTE) 500 MG DR tablet   9. Migraine without aura and without status migrainosus, not intractable  G43.009 divalproex (DEPAKOTE) 500 MG DR tablet   10. H/O: stroke  Z86.73 divalproex (DEPAKOTE) 500 MG DR tablet   11. Chronic tension-type headache, intractable  G44.221 divalproex (DEPAKOTE) 500 MG DR tablet   12. Memory problem  R41.3 divalproex (DEPAKOTE) 500 MG DR tablet   13. Dizziness  R42 divalproex (DEPAKOTE) 500 MG DR tablet   14. Chronic neck pain  M54.2 divalproex (DEPAKOTE) 500 MG DR tablet    G89.29                 CONCERNS   &   INCREASED   RISK   FOR        * TIA,  CEREBRO  VASCULAR  ISCHEMIA, STROKE     *   DIZZINESS,   VERTEBROBASILAR  INSUFFICIENCY ,         *  SEIZURE   ACTIVITY,  EPILEPSY ,        *   COMPLICATED  MIGRAINES      *    CHRONIC  TENSION  HEADACHES      *   COGNITIVE  &   MEMORY PROBLEMS        *  MONONEUROPATHIES,   RADICULOPATHY       *   PERIPHERAL  NEUROPATHY,       *  GAIT  DIFFICULTIES  &   BALANCE PROBLMES   AND  FALL                VARIOUS  RISK   FACTORS   WERE  REVIEWED   AND   DISCUSSED.          *  PATIENT   HAS  MULTIPLE   MEDICAL, NEUROLOGICAL                        AND   MENTAL HEALTH   PROBLEMS .           PATIENT'S   MANAGEMENT  IS  CHALLENGING.            PLAN:                         Ashby Dawes  Of  The  Diagnoses,  The  Management & Treatment  Options            AND    Care  plan  Were          Reviewed and   Discussed   With  patient.           * Goals  And  Expectations  Of  The  Therapy  Discussed   And  Reviewed.          *   Benefits   And   Side  Effect  Profile  Of  Medication/s   Were   Discussed                * Need   For  Further   Follow up For  The  Various  Problems Were  discussed.          * Results  Of  The  Previous  Diagnostic tests were reviewed and  discussed                   Medical  Decision  Making  Was  Made  Based on the   Complexity  Of  Above  Mentioned  Diagnoses,    Data reviewed             And    Risk  Of  Significant   Co morbidities and   complicating   Factors.                 Medical  Decision  Was     High    Complexity   Due   To  The  Patient's  Multiple  Symptoms  &  Disease,            Complex  Treatment  Regimen,  Multiple medications           and   Risk  Of   Side  Effects,  Difficulty  In  Medication  Management  And  Diagnostic  Challenges           In  View  Of  The  Associated   Co  Morbid  Conditions   And  Problems.                           * FALL   PRECAUTIONS.      THESE  REVIEWED   AND  DISCUSSED      *  USE   WALKING  ASSISTANCE  DEVICES     QUAD  CANE        *   BE  CAREFUL  WITH  ACTIVITIES           *   AVOID   NECK  AND/ BACK  STRAINING  ACTIVITIES            *   ADEQUATE   FLUID  INTAKE    AND  ELECTROLYTE  BALANCE           * KEEP  DAIRY  OF   THE  NEUROLOGICAL  SYMPTOMS        RECORDING THE    DURATION  AND  FREQUENCY.          *  AVOID    CONDITIONS  AND  FACTORS   THAT  MAKE  NEUROLOGICAL  SYMPTOMS  WORSE.        *   SEIZURE  PRECAUTIONS.                 A)  Avoid  Working  At   [employer].          B)  Avoid  Working  With  Engineer, manufacturing systems.          C)  Avoid   Swimming,  Climbing  A  Ladder   Unattended.          D)  Avoid   Driving   If  You   Have  A  Seizure.          E)  Must   Be  Seizure  Free   For  At   Least   6 months,  Before   You  Can drive.          F) Some times  Your  May  Feel  Seizure coming  Before  It  Begins.                You  May feelsmell or funny  Feeling  In  Your  Stomach,  Which is  Called   Aura.                     TIPS  TO  REDUCE/ PREVENT  SEIZURES           1.  Take  Your  Anti seizure  Medications   As   Recommended.       2.  Get   Enough   Sleep.   Sleep  Deprivation   Can  Trigger  A  Seizure.       3.  If   You   Have  A fever,  Treat  It  At  Once,  And  Contact   Your  Primary  Care Providers.                                                                                                                           4. Avoid   Alcohol.       5. Avoid  Flashing  Lights,  Loud  Noises and  TV  And  Video  Games,             As   These  May  Trigger   Your  Seizures       6.  Control  Your  Stress  And   Have  Adequate  Rest.       7.   If  You  Feel  A  Seizure  Coming On :             A) warn people  Who  Are  With  You             B)  Make  Sure  There  Are  No  Sharp or  Hard  Objects  Around you.             C)  Lay down  On  Your  Side  And  Relax.                  *TO  MAINTAIN  REGULAR  SLEEP  WAKE  CYCLES.     *   TO  HAVE  ADEQUATE  REST  AND   SLEEP    HOURS.            *    AVOID  ANY USAGE OF    TOBACCO,          AVOID  EXCESSIVE  ALCOHOL  AND   ILLEGAL   SUBSTANCES          *  CONTINUE   MEDICATIONS    PRESCRIBED       AS     RECOMMENDED       *   Compliance   With  Medications   And  Instructions            *    MIGRAINE/ HEADACHE    DAIRY   WITH  MONITORING                       OF  DURATION  AND  FREQUENCY.        *  May   Use  Pill  Box,    If  Needed        *  MEDICATIONS TO AVOID:    WELLBUTRIN,  ULTRAM            *    Antiplatelet  therapy    As   Recommended  Was   Discussed      *    Prophylactic  Use   Of     Vitamin   B   Complex,  Folic  Acid,    Vitamin  B12    Multivitamin,       Calcium  With  magnesium  And  Vit D    Supplementations   Over  The  Counter  Discussed               *  PATIENT  IS  ALSO   COUNSELED   TO  KEEP    ACTIVITIES:         A)   SIMPLE      B)  ORGANIZED      C)  WRITEDOWN                     *  EVALUATIONS  AND  FOLLOW UP:                   * PHYSICAL  THERAPY                                 *CARDIOLOGY              *   OPTHALMOLOGY                              *     RECOMMENDATIONS  :  A)    FALL PRECAUTIONS                              B)   SEIZURE  PRECAUTIONS                             C)    DEPAKOTE    500   MG    PO  BID                                           FOR   MIGRAINE    AND   SEIZURE    MANAGEMENT                             D)     ASA    81   MG  PO    DAILY                                E)     IMITREX      AND    FIORICET      AS   NEEDED                                             FOR   MIGRAINES  HEADACHES                             F)    DEPAKOTE  LEVEL     AND  FOLLOW  UP   EEG                                --   REVIEWED     AND  DISCUSSED    WITH  PATIENT                            Controlled Substances Monitoring: Periodic Controlled Substance Monitoring: Possible medication side effects, risk of tolerance/dependence & alternative treatments discussed.,Assessed functional status. Luiz Iron, MD)            Orders Placed This Encounter   Procedures   ??? Valproic Acid Level, Total   ??? EEG       Orders Placed This Encounter    Medications   ??? SUMAtriptan (IMITREX) 100 MG tablet     Sig: Take 1 tablet by mouth once as needed for Migraine     Dispense:  12 tablet     Refill:  2   ??? divalproex (DEPAKOTE) 500 MG DR tablet     Sig: ONE  TABLET    AM    AND  ONE    TABLET   PM    DAILY     Dispense:  60 tablet     Refill:  3   ??? butalbital-acetaminophen-caffeine (ESGIC) 50-325-40 MG per tablet     Sig: ONE  TO  TWO  TABLETS  IN  THE  EVENING  AND  BED TIME  AS  NEEDED     Dispense:  60 tablet     Refill:  1             *PATIENT   TO  FOLLOW  UP  WITH   PRIMARY  CARE         OTHER  CONSULTANTS  AS  BEFORE.               *TO  FOLLOW  WITH   MENTAL  HEALTH  PROFESSIONALS ,  INCLUDING            PSYCHOLOGICAL  COUNSELING   AND  PSYCHIATRIC  EVALUATIONS                    --    REVIEWED     AND  DISCUSSED    WITH  PATIENT                         *  Maintain   Healthy  Life Style    With   Periodic  Monitoring  Of          Any  Medical  Conditions  Including   Elevated  Blood  Pressure,  Lipid  Profile,        Blood  Sugar levels  AndHeart  Disease.                *   Period   Screening  For  Cancers  Involving  Breast,  Colon,         Lungs  And  Other  Organs  As  Applicable,  In consultation   With  Your  Primary Care Providers.               *Second  Neurological  Opinion  And  Evaluations  In  [facility]  Setting  If  Patient  Is  Interested.                 * Please   Contact   Neurology  Clinic   Early   If   Are  Any  New  Neurological   And  Any neurological  Concerns.                   *  If  The  Patient remains  Neurologically  Stable   Return   To  [facility] Neurology Department   IN      2 - 3        MONTHS  TIME   FOR  FURTHER              FOLLOW UP.                       *   The  Neurological   Findings,  Possible  Diagnosis,  Differential diagnoses                    And  Options  For    Further   Investigations                   And  management   Are  Discussed  Comprehensively.  Medications   And  Prescription   Risks  And  Side effects  Are   Also  Discussed.                     *  If   There is  Any  Significant  Worsening   Of  Current  Symptoms  And  Or                  If patient  Develops   Any additional  New  NeurologicalSymptoms                  Or  Significant  Concerns   Should  Call  911 or  Go  To  Emergency  Department                  For  Further  Immediate  Evaluation and  management .                         The   Above  Were  Reviewed  With  PATIENT   and                         questions  Answered  In  Detail.                                         Electronically signed by   Luiz Iron,  M.D., [place].     Board Certified in  Neurology &  In  Brain Injury Medicine   American Board of Psychiatry and Neurology (ABPN)      DISCLAIMER:   Although every effort was made to ensure the accuracy of this  electronictranscription, some errors in transcription may have occurred.      GENERAL PATIENT INSTRUCTIONS:     ??? A Healthy Lifestyle: Care Instructions  ??? Your Care Instructions  ??? A healthy lifestyle can help you feel good, stay at ahealthy weight, and have plenty of energy for both work and play. A healthy lifestyle is something you can share with your whole family.  ??? A healthy lifestyle also can lower your risk for serious health problems, such ashigh blood pressure, heart disease, and diabetes.  ??? You can follow a few steps listed below to improve your health and the health of your family.  ??? Follow-up careis a key part of your treatment and safety. Be sure to make and go to all appointments, and call your doctor if you are having problems. It???s also a good idea to know your test results and keep a list of the medicines you take.  ??? How can you care for yourself at home?  ??? Do not eat too much sugar, fat, or fast foods. You can still have dessert and treats nowand then. The goal is moderation.  ??? Start small to improve your eating habits. Pay attention to portion  sizes, drink less juice and soda pop, and eat more fruits and vegetables.  ??? Eat a healthy amount of food. A 3-ounce serving of meat, for example, is about the size of a deck of cards. Fill the rest of your plate with vegetables and whole grains.  ??? Limit theamount of soda and sports drinks you have every day. Drink more water when  you are thirsty.  ??? Eat at least 5 servings of fruits and vegetables every day. It may seem like a lot, but it is not hard to reach this goal. Aserving or helping is 1 piece of fruit, 1 cup of vegetables, or 2 cups of leafy, raw vegetables. Have an apple or some carrot sticks as an afternoon snack instead of a candy bar. Try to have fruits and/or vegetables at everymeal.  ??? Make exercise part of your daily routine. You may want to start with simple activities, such as walking, bicycling, or slow swimming. Try tobe active 30 to 60 minutes every day. You do not need to do all 30 to 60 minutes all at once. For example, you can exercise 3 times a day for 10 or 20 minutes. Moderate exercise is safe for most people, but it is always agood idea to talk to your doctor before starting an exercise program.  ??? Keep moving. Mow the lawn, work in the garden, or [employer]. Take the stairs instead of the elevator at work.  ??? If you smoke, quit. Peoplewho smoke have an increased risk for heart attack, stroke, cancer, and other lung illnesses. Quitting is hard, but there are ways to boost your chance of quitting tobacco for good.  ??? Use nicotine gum, patches, or lozenges.  ??? Ask your doctor about stop-smoking programs and medicines.  ??? Keep trying.  ??? In addition to reducing your risk of diseases in the future, you will notice some benefits soon after you stop using tobacco. If you have shortness of breath or asthma symptoms, they will likely getbetter within a few weeks after you quit.  ??? Limit how much alcohol you drink. Moderate amounts of alcohol (up to 2 drinks a day for men, 1drink a day for  women) are okay. But drinking too much can lead to liver problems, high blood pressure, and other health problems.  ??? health  ??? If you have a family, there are many things you can do together to improve your health.  ??? Eat meals together as a family as often as possible.  ??? Eat healthy foods. This includes fruits, vegetables, lean meats and dairy, and whole grains.  ??? Include your family in your fitness plan. Most peoplethink of activities such as jogging or tennis as the way to fitness, but there are many ways you and your family can be more active. Anything that makes you breathe hard and gets your heart pumping is exercise. Here are sometips:  ??? Walk to do errands or to take your child to school or the bus.  ??? Go for a family bike ride after dinner instead of watching TV.  ??? Where can you learn more?  ??? Go [email or web address] and sign in to your MyChart account. Enter [phone] in the Search HealthInformation box to learn more about "A Healthy Lifestyle: Care Instructions."     If you do not have anaccount, please click on the "Sign Up Now" link.  ??? Current as of: June 20, 2015  ??? Content Version: 11.2  ??? ?? 2006-2017 [employer]. Care instructions adapted under license by [facility]. If you have questions about a medical condition or this instruction, always ask your healthcare professional. [employer] disclaims any warranty or liability for your use of this information.

## 2021-02-26 NOTE — Progress Notes (Signed)
This writer contacted scheduling to schedule the following testing: EEG - patient made aware, will contact office with further needs.

## 2021-02-26 NOTE — Patient Instructions (Signed)
*   SEIZURE  PRECAUTIONS.                 A)  Avoid  Working  At   [employer].          B)  Avoid  Working  With  Heavy machinery.          C)  Avoid   Swimming,  Climbing  A  Ladder   Unattended.          D)  Avoid   Driving   If  You   Have  A  Seizure.          E)  Must   Be  Seizure  Free   For  At   Least   6 months,  Before   You  Can drive.                                                                                                                                    F) Some times  Your  May  Feel  Seizure coming  Before  It  Begins.  You  May feel             Strange smell or funny  Feeling  In  Your  Stomach,  Which is  Called   Aura.                     TIPS  TO  REDUCE/ PREVENT  SEIZURES         1.  Take  Your  Anti seizure  Medications   As   Recommended.       2. Get   Enough   Sleep.   Sleep  Deprivation   Can  Trigger  A  Seizure.       3. If   You   Have  A fever,  Treat  It  At  Once,  And  Contact   Your  Primary  Care Providers.                                                                                                                           4. Avoid   Alcohol.       5. Avoid  Flashing  Lights,  Loud  Noises and  TV  And  Video  Games,             As   These  May  Trigger   Your  Seizures       6.  Control  Your  Stress  And   Have  Adequate  Rest.       7.   If  You  Feel  A  Seizure  Coming   On :           A) warn people  Who  Are  With  You           B)  Make  Sure  There  Are  No  Sharp or  Hard  Objects  Around you.           C)  Lay down  On  Your  Side  And  Relax.               *   ADEQUATE   FLUID  INTAKE   AND  ELECTROLYTE  BALANCE             * KEEP  DAIRY  OF   THE  NEUROLOGICAL  SYMPTOMS          *  TO  MAINTAIN  REGULAR  SLEEP  WAKE  CYCLES.     *   TO  HAVE  ADEQUATE  REST  AND   SLEEP    HOURS.          *    AVOID  USAGE OF   TOBACCO,  EXCESSIVE  ALCOHOL                AND   ILLEGAL   SUBSTANCES,  IF  ANY          *  Maintain   Healthy  Life Style    With   Periodic   Monitoring  Of         Any  Medical  Conditions  Including   Elevated  Blood  Pressure,  Lipid  Profile,       Blood  Sugar levels  And   Heart  Disease.                *   Period   Screening  For  Cancers  Involving  Breast,  Colon,         Lungs  And  Other  Organs  As  Applicable,           In consultation   With  Your  Primary Care Providers.                *  If   There is  Any  Significant  Worsening   Of  Current  Symptoms  And             Or  If    Any additional  New  Neurological  Symptoms  and          Significant  Concerns   Should  Call  911 or  Go  To  Emergency  Department            For  Further  Immediate  Evaluation.

## 2021-03-06 ENCOUNTER — Ambulatory Visit: Payer: PRIVATE HEALTH INSURANCE

## 2021-05-02 ENCOUNTER — Encounter: Attending: Neurology

## 2021-06-29 ENCOUNTER — Encounter (HOSPITAL_COMMUNITY): Payer: Self-pay | Admitting: Pharmacy Technician

## 2021-06-29 ENCOUNTER — Other Ambulatory Visit: Payer: Self-pay

## 2021-06-29 ENCOUNTER — Emergency Department (HOSPITAL_COMMUNITY): Payer: Medicaid - Out of State

## 2021-06-29 ENCOUNTER — Emergency Department (HOSPITAL_COMMUNITY)
Admission: EM | Admit: 2021-06-29 | Discharge: 2021-06-30 | Disposition: A | Discharge status: Home / Self Care | Payer: Medicaid - Out of State | Attending: Emergency Medicine | Admitting: Emergency Medicine

## 2021-06-29 DIAGNOSIS — U071 COVID-19: Secondary | ICD-10-CM | POA: Insufficient documentation

## 2021-06-29 DIAGNOSIS — R042 Hemoptysis: Secondary | ICD-10-CM | POA: Diagnosis present

## 2021-06-29 LAB — RESP PANEL BY RT-PCR (FLU A&B, COVID) ARPGX2
Influenza A by PCR: NEGATIVE
Influenza B by PCR: NEGATIVE
SARS Coronavirus 2 by RT PCR: POSITIVE — AB

## 2021-06-29 LAB — COMPREHENSIVE METABOLIC PANEL
ALT: 28 U/L (ref 0–44)
AST: 31 U/L (ref 15–41)
Albumin: 3.9 g/dL (ref 3.5–5.0)
Alkaline Phosphatase: 68 U/L (ref 38–126)
Anion gap: 9 (ref 5–15)
BUN: 8 mg/dL (ref 6–20)
CO2: 21 mmol/L — ABNORMAL LOW (ref 22–32)
Calcium: 8.6 mg/dL — ABNORMAL LOW (ref 8.9–10.3)
Chloride: 105 mmol/L (ref 98–111)
Creatinine, Ser: 1 mg/dL (ref 0.61–1.24)
GFR, Estimated: >60 mL/min (ref >60)
Glucose, Bld: 98 mg/dL (ref 70–99)
Potassium: 3.8 mmol/L (ref 3.5–5.1)
Sodium: 135 mmol/L (ref 135–145)
Total Bilirubin: 0.9 mg/dL (ref 0.3–1.2)
Total Protein: 7.5 g/dL (ref 6.5–8.1)

## 2021-06-29 LAB — CBC WITH DIFFERENTIAL/PLATELET
Abs Immature Granulocytes: 0.04 10*3/uL (ref 0.00–0.07)
Basophils Absolute: 0 10*3/uL (ref 0.0–0.1)
Basophils Relative: 1 %
Eosinophils Absolute: 0.1 10*3/uL (ref 0.0–0.5)
Eosinophils Relative: 1 %
HCT: 51.1 % (ref 39.0–52.0)
Hemoglobin: 17.4 g/dL — ABNORMAL HIGH (ref 13.0–17.0)
Immature Granulocytes: 1 %
Lymphocytes Relative: 29 %
Lymphs Abs: 1.7 10*3/uL (ref 0.7–4.0)
MCH: 29.2 pg (ref 26.0–34.0)
MCHC: 34.1 g/dL (ref 30.0–36.0)
MCV: 85.7 fL (ref 80.0–100.0)
Monocytes Absolute: 0.8 10*3/uL (ref 0.1–1.0)
Monocytes Relative: 14 %
Neutro Abs: 3 10*3/uL (ref 1.7–7.7)
Neutrophils Relative %: 54 %
Platelets: 263 10*3/uL (ref 150–400)
RBC: 5.96 MIL/uL — ABNORMAL HIGH (ref 4.22–5.81)
RDW: 12.7 % (ref 11.5–15.5)
WBC: 5.6 10*3/uL (ref 4.0–10.5)
nRBC: 0 % (ref 0.0–0.2)

## 2021-06-29 LAB — D-DIMER, QUANTITATIVE: D-Dimer, Quant: 0.66 ug/mL-FEU — ABNORMAL HIGH (ref 0.00–0.50)

## 2021-06-29 LAB — TROPONIN I (HIGH SENSITIVITY)
Troponin I (High Sensitivity): 3 ng/L (ref <18)
Troponin I (High Sensitivity): 3 ng/L (ref <18)

## 2021-06-29 MED ORDER — METHYLPREDNISOLONE SODIUM SUCC 125 MG IJ SOLR
125.0000 mg | Freq: Once | INTRAMUSCULAR | Status: AC
  Administered 2021-06-29: 125 mg via INTRAVENOUS
  Filled 2021-06-29: qty 2

## 2021-06-29 MED ORDER — BENZONATATE 100 MG PO CAPS: 100.0000 mg | ORAL_CAPSULE | Freq: Three times a day (TID) | ORAL | 0 refills | Status: AC | PRN

## 2021-06-29 MED ORDER — ALBUTEROL SULFATE HFA 108 (90 BASE) MCG/ACT IN AERS
2.0000 | INHALATION_SPRAY | Freq: Once | RESPIRATORY_TRACT | Status: AC
  Administered 2021-06-29: 2 via RESPIRATORY_TRACT
  Filled 2021-06-29: qty 6.7

## 2021-06-29 MED ORDER — BEBTELOVIMAB 175 MG/2 ML IV (EUA)
175.0000 mg | Freq: Once | INTRAMUSCULAR | Status: AC
  Administered 2021-06-29: 175 mg via INTRAVENOUS
  Filled 2021-06-29: qty 2

## 2021-06-29 MED ORDER — SODIUM CHLORIDE 0.9 % IV BOLUS
1000.0000 mL | Freq: Once | INTRAVENOUS | Status: AC
  Administered 2021-06-29: 1000 mL via INTRAVENOUS

## 2021-06-29 MED ORDER — IOHEXOL 350 MG/ML SOLN
75.0000 mL | Freq: Once | INTRAVENOUS | Status: AC | PRN
  Administered 2021-06-29: 75 mL via INTRAVENOUS

## 2021-06-29 MED ORDER — ONDANSETRON HCL 4 MG/2ML IJ SOLN
4.0000 mg | Freq: Once | INTRAMUSCULAR | Status: AC
  Administered 2021-06-29: 4 mg via INTRAVENOUS
  Filled 2021-06-29: qty 2

## 2021-06-29 NOTE — ED Triage Notes (Signed)
Pt here with reports of cough, chest pain and hemoptysis X1. Pt in NAD.

## 2021-06-29 NOTE — ED Provider Notes (Signed)
[facility] EMERGENCY DEPARTMENT Provider Note   CSN: [id number] Arrival date & time: 06/29/21  1237     History Chief Complaint  Patient presents with   Hemoptysis    Carl Reyes is a 40 y.o. male hx of seizure, stroke, here presenting with hemoptysis.  Patient states that he has been having chills and coughing for several days.  He states that he is coughing up some greenish-yellowish phlegm.  He states that today he had a coughing spell and apparently coughed up some sputum with about 1 tablespoon of blood.  Patient just moved here from [place] recently.  Patient was seen in triage and has elevated D-dimer.  No history of blood clots in the past.  Not on any blood thinners.   The history is provided by the patient.      Past Medical History:  Diagnosis Date   GERD (gastroesophageal reflux disease)    Migraines    Seizure disorder (HCC)    Stroke (HCC)    2017    There are no problems to display for this patient.   Past Surgical History:  Procedure Laterality Date   TONSILLECTOMY         No family history on file.  Social History   Tobacco Use   Smoking status: Never   Smokeless tobacco: Never  Vaping Use   Vaping Use: Never used  Substance Use Topics   Alcohol use: No   Drug use: No    Home Medications Prior to Admission medications   Medication Sig Start Date End Date Taking? Authorizing Provider  azithromycin (ZITHROMAX) 250 MG tablet Take 1 tablet (250 mg total) by mouth daily. Take first 2 tablets together, then 1 every day until finished. 12/23/17   Elson Areas, PA-C  benzonatate (TESSALON) 100 MG capsule Take 1 capsule (100 mg total) by mouth every 8 (eight) hours. 12/23/17   Elson Areas, PA-C    Allergies    Penicillins  Review of Systems   Review of Systems  Respiratory:  Positive for cough.   All other systems reviewed and are negative.  Physical Exam Updated Vital Signs BP (!) 142/79   Pulse 71   Temp (!)  97.4 F (36.3 C) (Oral)   Resp 17   Ht 6' (1.829 m)   Wt 104.3 kg   SpO2 93%   BMI 31.19 kg/m   Physical Exam Vitals and nursing note reviewed.  Constitutional:      Comments: Tachypneic and coughing  HENT:     Head: Normocephalic.     Nose: Nose normal.     Mouth/Throat:     Mouth: Mucous membranes are dry.  Eyes:     Extraocular Movements: Extraocular movements intact.     Pupils: Pupils are equal, round, and reactive to light.  Cardiovascular:     Rate and Rhythm: Normal rate and regular rhythm.     Pulses: Normal pulses.     Heart sounds: Normal heart sounds.  Pulmonary:     Comments: Diminished bilaterally and mild wheezing.  No retractions Abdominal:     General: Abdomen is flat.     Palpations: Abdomen is soft.  Musculoskeletal:        General: Normal range of motion.     Cervical back: Normal range of motion and neck supple.  Skin:    General: Skin is warm.     Capillary Refill: Capillary refill takes less than 2 seconds.  Neurological:  General: No focal deficit present.     Mental Status: He is oriented to person, place, and time.  Psychiatric:        Mood and Affect: Mood normal.        Behavior: Behavior normal.    ED Results / Procedures / Treatments   Labs (all labs ordered are listed, but only abnormal results are displayed) Labs Reviewed  RESP PANEL BY RT-PCR (FLU A&B, COVID) ARPGX2 - Abnormal; Notable for the following components:      Result Value   SARS Coronavirus 2 by RT PCR POSITIVE (*)    All other components within normal limits  CBC WITH DIFFERENTIAL/PLATELET - Abnormal; Notable for the following components:   RBC 5.96 (*)    Hemoglobin 17.4 (*)    All other components within normal limits  COMPREHENSIVE METABOLIC PANEL - Abnormal; Notable for the following components:   CO2 21 (*)    Calcium 8.6 (*)    All other components within normal limits  D-DIMER, QUANTITATIVE - Abnormal; Notable for the following components:   D-Dimer,  Quant 0.66 (*)    All other components within normal limits  VALPROIC ACID LEVEL  TROPONIN I (HIGH SENSITIVITY)  TROPONIN I (HIGH SENSITIVITY)    EKG EKG Interpretation  Date/Time:  Friday June 29 2021 13:12:05 EDT Ventricular Rate:  86 PR Interval:  150 QRS Duration: 82 QT Interval:  338 QTC Calculation: 404 R Axis:   62 Text Interpretation: Normal sinus rhythm with sinus arrhythmia Cannot rule out Anterior infarct , age undetermined Abnormal ECG No significant change since last tracing Confirmed by Richardean Canal [phone]) on 06/29/2021 7:07:25 PM  Radiology DG Chest 2 View  Result Date: 06/29/2021 CLINICAL DATA:  Shortness of breath or cough EXAM: CHEST - 2 VIEW COMPARISON:  Chest radiograph 05/23/2018 FINDINGS: The heart is not enlarged. The mediastinal contours are within normal limits. There is no focal consolidation or pulmonary edema. There is no pleural effusion or pneumothorax. The bones are unremarkable. IMPRESSION: No radiographic evidence of acute cardiopulmonary process. Electronically Signed   By: Lesia Hausen MD   On: 06/29/2021 14:04   CT Angio Chest PE W and/or Wo Contrast  Result Date: 06/29/2021 CLINICAL DATA:  Cough and chest pain with hemoptysis EXAM: CT ANGIOGRAPHY CHEST WITH CONTRAST TECHNIQUE: Multidetector CT imaging of the chest was performed using the standard protocol during bolus administration of intravenous contrast. Multiplanar CT image reconstructions and MIPs were obtained to evaluate the vascular anatomy. CONTRAST:  72mL OMNIPAQUE IOHEXOL 350 MG/ML SOLN COMPARISON:  Plain film from earlier in the same day. FINDINGS: Cardiovascular: Thoracic aorta shows no aneurysmal dilatation or dissection. No cardiac enlargement is noted. No coronary calcifications are seen. The pulmonary artery is well visualized within normal branching pattern. No intraluminal filling defects are noted to suggest pulmonary emboli. Mediastinum/Nodes: Thoracic inlet is within normal limits.  No sizable hilar or mediastinal adenopathy is noted. Esophagus as visualized is within normal limits. Lungs/Pleura: Lungs are well aerated bilaterally. Mild dependent atelectatic changes are seen. No sizable effusion is noted. No parenchymal nodules are seen. Upper Abdomen: Visualized upper abdomen shows no acute abnormality. Musculoskeletal: No chest wall abnormality. No acute or significant osseous findings. Review of the MIP images confirms the above findings. IMPRESSION: No evidence of pulmonary emboli. Mild dependent atelectatic changes bilaterally. Electronically Signed   By: Alcide Clever M.D.   On: 06/29/2021 22:25    Procedures Procedures   Medications Ordered in ED Medications  bebtelovimab EUA injection  SOLN 175 mg (has no administration in time range)  sodium chloride 0.9 % bolus 1,000 mL (1,000 mLs Intravenous New Bag/Given 06/29/21 2137)  ondansetron (ZOFRAN) injection 4 mg (4 mg Intravenous Given 06/29/21 2132)  methylPREDNISolone sodium succinate (SOLU-MEDROL) 125 mg/2 mL injection 125 mg (125 mg Intravenous Given 06/29/21 2134)  albuterol (VENTOLIN HFA) 108 (90 Base) MCG/ACT inhaler 2 puff (2 puffs Inhalation Given 06/29/21 2140)  iohexol (OMNIPAQUE) 350 MG/ML injection 75 mL (75 mLs Intravenous Contrast Given 06/29/21 2210)    ED Course  I have reviewed the triage vital signs and the nursing notes.  Pertinent labs & imaging results that were available during my care of the patient were reviewed by me and considered in my medical decision making (see chart for details).    MDM Rules/Calculators/A&P                          Gilles Trimpe is a 40 y.o. male here presenting with productive cough and blood-tinged sputum.  Patient has some cough and chills for the last 3 days.  Patient is fully vaccinated against covid but didn't get booster shot. Also recently moved here from [place]. Consider COVID vs PE vs pneumonia. Will get labs, d-dimer, COVID, CXR   10:54 PM D-dimer mildly elevated.  CTA showed no PE. Hg is 17. COVID positive. Offered Paxlovid vs monoclonal antibody and he [facility] and [facility] about getting [facility]lovid prescription filled. Given monoclonal antibody in the ED. Patient not hypoxic. Stable for discharge     Final Clinical Impression(s) / ED Diagnoses Final diagnoses:  None    Rx / DC Orders ED Discharge Orders     None        Charlynne Pander, MD 06/29/21 2255

## 2021-06-29 NOTE — Discharge Instructions (Addendum)
You have COVID causing your cough.   You can use albuterol every 4 hrs as needed for cough   You can also use tessalon pearls as needed for cough   See your doctor for follow up   You need to isolate for 10 days   Return to ER if you have worse cough with blood, fever, trouble breathing      Person Under Monitoring Name: Carl Reyes  Location: [address] [place] [address]   Infection Prevention Recommendations for Individuals Confirmed to have, or Being Evaluated for, 2019 Novel Coronavirus (COVID-19) Infection Who Receive Care at Home  Individuals who are confirmed to have, or are being evaluated for, COVID-19 should follow the prevention steps below until a healthcare provider or local or state health department says they can return to normal activities.  Stay home except to get medical care You should restrict activities outside your home, except for getting medical care. Do not go to work, school, or public areas, and do not use public transportation or taxis.  Call ahead before visiting your doctor Before your medical appointment, call the healthcare provider and tell them that you have, or are being evaluated for, COVID-19 infection. This will help the healthcare provider's office take steps to keep other people from getting infected. Ask your healthcare provider to call the local or state health department.  Monitor your symptoms Seek prompt medical attention if your illness is worsening (e.g., difficulty breathing). Before going to your medical appointment, call the healthcare provider and tell them that you have, or are being evaluated for, COVID-19 infection. Ask your healthcare provider to call the local or state health department.  Wear a facemask You should wear a facemask that covers your nose and mouth when you are in the same room with other people and when you visit a healthcare provider. People who live with or visit you should also wear  a facemask while they are in the same room with you.  Separate yourself from other people in your home As much as possible, you should stay in a different room from other people in your home. Also, you should use a separate bathroom, if available.  Avoid sharing household items You should not share dishes, drinking glasses, cups, eating utensils, towels, bedding, or other items with other people in your home. After using these items, you should wash them thoroughly with soap and water.  Cover your coughs and sneezes Cover your mouth and nose with a tissue when you cough or sneeze, or you can cough or sneeze into your sleeve. Throw used tissues in a lined trash can, and immediately wash your hands with soap and water for at least 20 seconds or use an alcohol-based hand rub.  Wash your [employer] your hands often and thoroughly with soap and water for at least 20 seconds. You can use an alcohol-based hand sanitizer if soap and water are not available and if your hands are not visibly dirty. Avoid touching your eyes, nose, and mouth with unwashed hands.   Prevention Steps for Caregivers and Household Members of Individuals Confirmed to have, or Being Evaluated for, COVID-19 Infection Being Cared for in the Home  If you live with, or provide care at home for, a person confirmed to have, or being evaluated for, COVID-19 infection please follow these guidelines to prevent infection:  Follow healthcare provider's instructions Make sure that you understand and can help the patient follow any healthcare provider instructions for all  care.  Provide for the patient's basic needs You should help the patient with basic needs in the home and provide support for getting groceries, prescriptions, and other personal needs.  Monitor the patient's symptoms If they are getting sicker, call his or her medical provider and tell them that the patient has, or is being evaluated for, COVID-19 infection.  This will help the healthcare provider's office take steps to keep other people from getting infected. Ask the healthcare provider to call the local or state health department.  Limit the number of people who have contact with the patient If possible, have only one caregiver for the patient. Other household members should stay in another home or place of residence. If this is not possible, they should stay in another room, or be separated from the patient as much as possible. Use a separate bathroom, if available. Restrict visitors who do not have an essential need to be in the home.  Keep older adults, very young children, and other sick people away from the patient Keep older adults, very young children, and those who have compromised immune systems or chronic health conditions away from the patient. This includes people with chronic heart, lung, or kidney conditions, diabetes, and cancer.  Ensure good ventilation Make sure that shared spaces in the home have good air flow, such as from an air conditioner or an opened window, weather permitting.  Wash your hands often Wash your hands often and thoroughly with soap and water for at least 20 seconds. You can use an alcohol based hand sanitizer if soap and water are not available and if your hands are not visibly dirty. Avoid touching your eyes, nose, and mouth with unwashed hands. Use disposable paper towels to dry your hands. If not available, use dedicated cloth towels and replace them when they become wet.  Wear a facemask and gloves Wear a disposable facemask at all times in the room and gloves when you touch or have contact with the patient's blood, body fluids, and/or secretions or excretions, such as sweat, saliva, sputum, nasal mucus, vomit, urine, or feces.  Ensure the mask fits over your nose and mouth tightly, and do not touch it during use. Throw out disposable facemasks and gloves after using them. Do not reuse. Wash your hands  immediately after removing your facemask and gloves. If your personal clothing becomes contaminated, carefully remove clothing and launder. Wash your hands after handling contaminated clothing. Place all used disposable facemasks, gloves, and other waste in a lined container before disposing them with other household waste. Remove gloves and wash your hands immediately after handling these items.  Do not share dishes, glasses, or other household items with the patient Avoid sharing household items. You should not share dishes, drinking glasses, cups, eating utensils, towels, bedding, or other items with a patient who is confirmed to have, or being evaluated for, COVID-19 infection. After the person uses these items, you should wash them thoroughly with soap and water.  Wash laundry thoroughly Immediately remove and wash clothes or bedding that have blood, body fluids, and/or secretions or excretions, such as sweat, saliva, sputum, nasal mucus, vomit, urine, or feces, on them. Wear gloves when handling laundry from the patient. Read and follow directions on labels of laundry or clothing items and detergent. In general, wash and dry with the warmest temperatures recommended on the label.  Clean all areas the individual has used often Clean all touchable surfaces, such as counters, tabletops, doorknobs, bathroom fixtures, toilets, phones,  keyboards, tablets, and bedside tables, every day. Also, clean any surfaces that may have blood, body fluids, and/or secretions or excretions on them. Wear gloves when cleaning surfaces the patient has come in contact with. Use a diluted bleach solution (e.g., dilute bleach with 1 part bleach and 10 parts water) or a household disinfectant with a label that says EPA-registered for coronaviruses. To make a bleach solution at home, add 1 tablespoon of bleach to 1 quart (4 cups) of water. For a larger supply, add  cup of bleach to 1 gallon (16 cups) of water. Read  labels of cleaning products and follow recommendations provided on product labels. Labels contain instructions for safe and effective use of the cleaning product including precautions you should take when applying the product, such as wearing gloves or eye protection and making sure you have good ventilation during use of the product. Remove gloves and wash hands immediately after cleaning.  Monitor yourself for signs and symptoms of illness Caregivers and household members are considered close contacts, should monitor their health, and will be asked to limit movement outside of the home to the extent possible. Follow the monitoring steps for close contacts listed on the symptom monitoring form.   ? If you have additional questions, contact your local health department or call the epidemiologist on call at [phone] (available 24/7). ? This guidance is subject to change. For the most up-to-date guidance from [facility], please refer to their website: [email or web address]

## 2021-06-29 NOTE — ED Notes (Signed)
Patient transported to CT 

## 2021-06-29 NOTE — ED Provider Notes (Signed)
Emergency Medicine Provider Triage Evaluation Note  Carl Reyes , a 40 y.o. male  was evaluated in triage.  Pt complains of chest pain and cough.  Patient reports that he has had the symptoms for the last 3 days.  Patient reports that cough is minimally productive.  Patient states that he did have an episode of hemoptysis this morning.  Patient estimates less than 1 tablespoon of blood.  Patient is not on any blood thinners.  Chest pain located to left side of chest, does not radiate.  No alleviating or any factors.  Patient also endorses chills, sore throat, and shortness of breath.  Patient diaphoresis, nausea, vomiting, abdominal pain.  Patient is not on any blood thinners.  Patient denies any history of cancer treatment, 6 months, prior history of DVT or PE, surgery in the last 4 weeks, hormone therapy.  Review of Systems  Positive: Chest pain, cough, chills, sore throat, shortness of breath, hemoptysis Negative: Leg swelling, diaphoresis, nausea, vomiting, syncope  Physical Exam  BP 116/84   Pulse 82   Temp 99.3 F (37.4 C) (Oral)   Resp 18   SpO2 97%  Gen:   Awake, no distress   Resp:  Normal effort lungs clear to auscultation bilaterally.  Patient speaks in full sentences without difficulty. MSK:   Moves extremities without difficulty no swelling or tenderness to bilateral lower extremities Other:    Medical Decision Making  Medically screening exam initiated at 1:19 PM.  Appropriate orders placed.  Carl Reyes was informed that the remainder of the evaluation will be completed by another provider, this initial triage assessment does not replace that evaluation, and the importance of remaining in the ED until their evaluation is complete.  The patient appears stable so that the remainder of the work up may be completed by another provider.      Haskel Schroeder, PA-C 06/29/21 1323    Rolan Bucco, MD 06/29/21 [phone]

## 2022-02-04 ENCOUNTER — Emergency Department (HOSPITAL_COMMUNITY)
Admission: EM | Admit: 2022-02-04 | Discharge: 2022-02-04 | Disposition: A | Discharge status: Home / Self Care | Payer: No Typology Code available for payment source | Attending: Emergency Medicine | Admitting: Emergency Medicine

## 2022-02-04 ENCOUNTER — Emergency Department (HOSPITAL_COMMUNITY): Payer: No Typology Code available for payment source

## 2022-02-04 ENCOUNTER — Encounter (HOSPITAL_COMMUNITY): Payer: Self-pay | Admitting: Emergency Medicine

## 2022-02-04 DIAGNOSIS — X58XXXA Exposure to other specified factors, initial encounter: Secondary | ICD-10-CM | POA: Diagnosis not present

## 2022-02-04 DIAGNOSIS — S39012A Strain of muscle, fascia and tendon of lower back, initial encounter: Secondary | ICD-10-CM | POA: Diagnosis not present

## 2022-02-04 DIAGNOSIS — S3992XA Unspecified injury of lower back, initial encounter: Secondary | ICD-10-CM | POA: Diagnosis present

## 2022-02-04 DIAGNOSIS — R110 Nausea: Secondary | ICD-10-CM | POA: Diagnosis not present

## 2022-02-04 DIAGNOSIS — R1032 Left lower quadrant pain: Secondary | ICD-10-CM | POA: Insufficient documentation

## 2022-02-04 DIAGNOSIS — D72829 Elevated white blood cell count, unspecified: Secondary | ICD-10-CM | POA: Insufficient documentation

## 2022-02-04 LAB — CBC WITH DIFFERENTIAL/PLATELET
Abs Immature Granulocytes: 0.04 10*3/uL (ref 0.00–0.07)
Basophils Absolute: 0.1 10*3/uL (ref 0.0–0.1)
Basophils Relative: 1 %
Eosinophils Absolute: 0.2 10*3/uL (ref 0.0–0.5)
Eosinophils Relative: 2 %
HCT: 49.3 % (ref 39.0–52.0)
Hemoglobin: 16.2 g/dL (ref 13.0–17.0)
Immature Granulocytes: 0 %
Lymphocytes Relative: 15 %
Lymphs Abs: 1.6 10*3/uL (ref 0.7–4.0)
MCH: 28.9 pg (ref 26.0–34.0)
MCHC: 32.9 g/dL (ref 30.0–36.0)
MCV: 88 fL (ref 80.0–100.0)
Monocytes Absolute: 0.7 10*3/uL (ref 0.1–1.0)
Monocytes Relative: 7 %
Neutro Abs: 8.4 10*3/uL — ABNORMAL HIGH (ref 1.7–7.7)
Neutrophils Relative %: 75 %
Platelets: 260 10*3/uL (ref 150–400)
RBC: 5.6 MIL/uL (ref 4.22–5.81)
RDW: 12.8 % (ref 11.5–15.5)
WBC: 11 10*3/uL — ABNORMAL HIGH (ref 4.0–10.5)
nRBC: 0 % (ref 0.0–0.2)

## 2022-02-04 LAB — URINALYSIS, ROUTINE W REFLEX MICROSCOPIC
Bilirubin Urine: NEGATIVE
Glucose, UA: NEGATIVE mg/dL
Hgb urine dipstick: NEGATIVE
Ketones, ur: NEGATIVE mg/dL
Leukocytes,Ua: NEGATIVE
Nitrite: NEGATIVE
Protein, ur: NEGATIVE mg/dL
Specific Gravity, Urine: 1.017 (ref 1.005–1.030)
pH: 5 (ref 5.0–8.0)

## 2022-02-04 LAB — COMPREHENSIVE METABOLIC PANEL
ALT: 22 U/L (ref 0–44)
AST: 21 U/L (ref 15–41)
Albumin: 3.6 g/dL (ref 3.5–5.0)
Alkaline Phosphatase: 60 U/L (ref 38–126)
Anion gap: 9 (ref 5–15)
BUN: 9 mg/dL (ref 6–20)
CO2: 25 mmol/L (ref 22–32)
Calcium: 8.5 mg/dL — ABNORMAL LOW (ref 8.9–10.3)
Chloride: 104 mmol/L (ref 98–111)
Creatinine, Ser: 0.96 mg/dL (ref 0.61–1.24)
GFR, Estimated: >60 mL/min (ref >60)
Glucose, Bld: 109 mg/dL — ABNORMAL HIGH (ref 70–99)
Potassium: 4.2 mmol/L (ref 3.5–5.1)
Sodium: 138 mmol/L (ref 135–145)
Total Bilirubin: 0.8 mg/dL (ref 0.3–1.2)
Total Protein: 6.3 g/dL — ABNORMAL LOW (ref 6.5–8.1)

## 2022-02-04 LAB — LIPASE, BLOOD: Lipase: 30 U/L (ref 11–51)

## 2022-02-04 MED ORDER — METHOCARBAMOL 500 MG PO TABS
500.0000 mg | ORAL_TABLET | Freq: Once | ORAL | Status: AC
  Administered 2022-02-04: 500 mg via ORAL
  Filled 2022-02-04: qty 1

## 2022-02-04 MED ORDER — ACETAMINOPHEN 325 MG PO TABS
650.0000 mg | ORAL_TABLET | Freq: Once | ORAL | Status: AC
  Administered 2022-02-04: 650 mg via ORAL
  Filled 2022-02-04: qty 2

## 2022-02-04 MED ORDER — ONDANSETRON HCL 4 MG/2ML IJ SOLN
4.0000 mg | Freq: Once | INTRAMUSCULAR | Status: AC
  Administered 2022-02-04: 4 mg via INTRAVENOUS
  Filled 2022-02-04: qty 2

## 2022-02-04 MED ORDER — METHOCARBAMOL 500 MG PO TABS: 500.0000 mg | ORAL_TABLET | Freq: Two times a day (BID) | ORAL | 0 refills | Status: AC

## 2022-02-04 MED ORDER — METHYLPREDNISOLONE 4 MG PO TBPK: ORAL_TABLET | ORAL | 0 refills | Status: DC

## 2022-02-04 MED ORDER — SODIUM CHLORIDE 0.9 % IV BOLUS
1000.0000 mL | Freq: Once | INTRAVENOUS | Status: AC
  Administered 2022-02-04: 1000 mL via INTRAVENOUS

## 2022-02-04 MED ORDER — OXYCODONE-ACETAMINOPHEN 5-325 MG PO TABS
1.0000 | ORAL_TABLET | Freq: Once | ORAL | Status: AC
  Administered 2022-02-04: 1 via ORAL
  Filled 2022-02-04: qty 1

## 2022-02-04 MED ORDER — KETOROLAC TROMETHAMINE 30 MG/ML IJ SOLN
30.0000 mg | Freq: Once | INTRAMUSCULAR | Status: AC
  Administered 2022-02-04: 30 mg via INTRAVENOUS
  Filled 2022-02-04: qty 1

## 2022-02-04 MED ORDER — METHYLPREDNISOLONE 4 MG PO TBPK: ORAL_TABLET | ORAL | 0 refills | Status: AC

## 2022-02-04 NOTE — ED Notes (Signed)
Formatting of this note might be different from the original.  Patient discharge instructions reviewed with the patient. The patient verbalized understanding of instructions. Patient discharged.  Electronically signed by Leota Sauers, RN at 02/04/2022 12:35 PM EDT

## 2022-02-04 NOTE — ED Notes (Signed)
Formatting of this note might be different from the original.  Pt able to ambulate independently, however the only assistance he needs occasionally is sitting up in the bed due to the pain in his back.  Electronically signed by Rhea Pink, NT at 02/04/2022 11:55 AM EDT

## 2022-02-04 NOTE — ED Notes (Signed)
Formatting of this note might be different from the original.  Pt able to ambulate independently    Electronically signed by Parke Poisson, RN at 02/04/2022 11:50 AM EDT

## 2022-02-04 NOTE — ED Provider Notes (Signed)
Formatting of this note is different from the original.  Images from the original note were not included.    [facility] EMERGENCY DEPARTMENT  Provider Note    CSN: [id number]  Arrival date & time: 02/04/22  9532        History    Chief Complaint   Patient presents with    Back Pain    Abdominal Pain     Larry Koch is a 41 y.o. male presenting to the ED with a chief complaint of back pain and abdominal pain.  States that he is having right lower back pain that radiates to his right flank and right abdomen as well as left-sided back pain that radiates down his left leg.  Symptoms have been going on for the past few days but worsened this morning to the point where he had pain with any type of movement.  He has not tried any medications to help with his symptoms.  He cannot recall any incident that may have triggered his symptoms other than possibly having a seizure a few days ago.  He does have a history of seizures.  Denies any urinary incontinence, dysuria, hematuria.  He reports nausea but denies any vomiting.  He does have a history of kidney stones.  Denies any chest pain, shortness of breath, numbness in arms or legs, falls    Back Pain  Associated symptoms: abdominal pain    Associated symptoms: no chest pain, no dysuria, no fever and no weakness          Home Medications  Prior to Admission medications    Medication Sig Start Date End Date Taking? Authorizing Provider   methocarbamol (ROBAXIN) 500 MG tablet Take 1 tablet (500 mg total) by mouth 2 (two) times daily. 02/04/22  Yes Khatri, Hina, PA-C   azithromycin (ZITHROMAX) 250 MG tablet Take 1 tablet (250 mg total) by mouth daily. Take first 2 tablets together, then 1 every day until finished. 12/23/17   Elson Areas, PA-C   benzonatate (TESSALON) 100 MG capsule Take 1 capsule (100 mg total) by mouth 3 (three) times daily as needed for cough. 06/29/21   Charlynne Pander, MD   methylPREDNISolone (MEDROL DOSEPAK) 4 MG TBPK tablet Taper  over 6 days. 02/04/22   Khatri, Hina, PA-C       Allergies     Penicillins      Review of Systems    Review of Systems   Constitutional:  Negative for appetite change, chills and fever.   HENT:  Negative for ear pain, rhinorrhea, sneezing and sore throat.    Eyes:  Negative for photophobia and visual disturbance.   Respiratory:  Negative for cough, chest tightness, shortness of breath and wheezing.    Cardiovascular:  Negative for chest pain and palpitations.   Gastrointestinal:  Positive for abdominal pain and nausea. Negative for blood in stool, constipation, diarrhea and vomiting.   Genitourinary:  Negative for dysuria, hematuria and urgency.   Musculoskeletal:  Positive for back pain. Negative for myalgias.   Skin:  Negative for rash.   Neurological:  Negative for dizziness, weakness and light-headedness.     Physical Exam  Updated Vital Signs  BP 118/72 (BP Location: Right Arm)   Pulse 71   Temp 98.7 F (37.1 C) (Oral)   Resp 20   SpO2 95%   Physical Exam  Vitals and nursing note reviewed.   Constitutional:       General: He is not in  acute distress.     Appearance: He is well-developed.   HENT:      Head: Normocephalic and atraumatic.      Nose: Nose normal.   Eyes:      General: No scleral icterus.        Right eye: No discharge.         Left eye: No discharge.      Conjunctiva/sclera: Conjunctivae normal.   Cardiovascular:      Rate and Rhythm: Normal rate and regular rhythm.      Heart sounds: Normal heart sounds. No murmur heard.    No friction rub. No gallop.   Pulmonary:      Effort: Pulmonary effort is normal. No respiratory distress.      Breath sounds: Normal breath sounds.   Abdominal:      General: Bowel sounds are normal. There is no distension.      Palpations: Abdomen is soft.      Tenderness: There is abdominal tenderness (RMQ, LLQ). There is right CVA tenderness and left CVA tenderness. There is no guarding.   Musculoskeletal:         General: Tenderness present. Normal range of motion.       Cervical back: Normal range of motion and neck supple.      Comments: No midline spinal tenderness present in lumbar, thoracic or cervical spine. No step-off palpated. No visible bruising, edema or temperature change noted. No objective signs of numbness present. No saddle anesthesia. 2+ DP pulses bilaterally. Sensation intact to light touch. Strength 5/5 in bilateral lower extremities.   Skin:     General: Skin is warm and dry.      Findings: No rash.   Neurological:      Mental Status: He is alert.      Motor: No abnormal muscle tone.      Coordination: Coordination normal.     ED Results / Procedures / Treatments    Labs  (all labs ordered are listed, but only abnormal results are displayed)  Labs Reviewed   COMPREHENSIVE METABOLIC PANEL - Abnormal; Notable for the following components:       Result Value    Glucose, Bld 109 (*)     Calcium 8.5 (*)     Total Protein 6.3 (*)     All other components within normal limits   CBC WITH DIFFERENTIAL/PLATELET - Abnormal; Notable for the following components:    WBC 11.0 (*)     Neutro Abs 8.4 (*)     All other components within normal limits   URINALYSIS, ROUTINE W REFLEX MICROSCOPIC   LIPASE, BLOOD     EKG  None    Radiology  CT Renal Stone Study    Result Date: 02/04/2022  CLINICAL DATA:  Flank pain.  Kidney stone suspected. EXAM: CT ABDOMEN AND PELVIS WITHOUT CONTRAST TECHNIQUE: Multidetector CT imaging of the abdomen and pelvis was performed following the standard protocol without IV contrast. RADIATION DOSE REDUCTION: This exam was performed according to the departmental dose-optimization program which includes automated exposure control, adjustment of the mA and/or kV according to patient size and/or use of iterative reconstruction technique. COMPARISON:  None. FINDINGS: Lower chest: No acute abnormality. Hepatobiliary: No focal liver abnormality is seen. No gallstones, gallbladder wall thickening, or biliary dilatation. Pancreas: Unremarkable. No pancreatic ductal  dilatation or surrounding inflammatory changes. Spleen: Normal in size without focal abnormality. Adrenals/Urinary Tract: Normal adrenal glands. No kidney stones, hydronephrosis or mass identified bilaterally. No ureteral  calculi identified. Urinary bladder is unremarkable. Stomach/Bowel: Stomach is within normal limits. Appendix appears normal. No evidence of bowel wall thickening, distention, or inflammatory changes. Vascular/Lymphatic: Normal appearance of the abdominal aorta. No aneurysm. No signs of abdominopelvic adenopathy. Reproductive: Prostate is unremarkable. Other: Small fat containing umbilical hernia, image 60/3. No abdominopelvic ascites. Musculoskeletal: No acute or significant osseous findings. IMPRESSION: 1. No acute findings within the abdomen or pelvis. No nephrolithiasis or signs of hydronephrosis. 2. Small fat containing umbilical hernia. Electronically Signed   By: Signa Kellaylor  Stroud M.D.   On: 02/04/2022 10:22      Procedures  Procedures     Medications Ordered in ED  Medications   sodium chloride 0.9 % bolus 1,000 mL (1,000 mLs Intravenous New Bag/Given 02/04/22 0801)   acetaminophen (TYLENOL) tablet 650 mg (650 mg Oral Given 02/04/22 0802)   methocarbamol (ROBAXIN) tablet 500 mg (500 mg Oral Given 02/04/22 0802)   ondansetron (ZOFRAN) injection 4 mg (4 mg Intravenous Given 02/04/22 0802)   oxyCODONE-acetaminophen (PERCOCET/ROXICET) 5-325 MG per tablet 1 tablet (1 tablet Oral Given 02/04/22 1111)   ketorolac (TORADOL) 30 MG/ML injection 30 mg (30 mg Intravenous Given 02/04/22 1111)     ED Course/ Medical Decision Making/ A&P  Clinical Course as of 02/04/22 1217   Mon Feb 04, 2022   0750 WBC(!): 11.0 [HK]   0845 Creatinine: 0.96 [HK]   0845 Lipase: 30 [HK]     Clinical Course User Index  [HK] Dietrich PatesKhatri, Hina, PA-C       Medical Decision Making  Amount and/or Complexity of Data Reviewed  Labs: ordered. Decision-making details documented in ED Course.  Radiology: ordered.    Risk  OTC drugs.  Prescription  drug management.    41 year old male presenting to the ED for back pain, abdominal pain.  Reports right-sided lower back pain that radiates to his right flank and right abdomen as well as left lower back pain that radiates down his left leg.  Reports associated nausea.  Reports a history of kidney stones but he states that typically with his kidney stones he will have pain with urination.  He denies any dysuria, hematuria, vomiting, numbness, weakness.  On exam there is tenderness of the right and left lower abdomen without rebound or guarding.  There is also bilateral CVA tenderness as well as tenderness throughout his lower lumbar paraspinal musculature bilaterally.  There is no numbness or weakness of lower extremities.  Will obtain lab work including urinalysis and attempt to treat symptomatically.    Lab work interpreted by me.  He has a slight leukocytosis of 11 which I feel is reactive to his pain.  His CMP, urinalysis and lipase are unremarkable.  CT renal stone study due to pain location shows no acute findings.  Suspect that his symptoms are musculoskeletal in nature.  I doubt dissection, cauda equina or other surgical or emergent cause of his symptoms based on his reassuring work-up and physical exam findings.  He reports significant improvement with symptoms with medications given here.  He is ambulatory without difficulty.  Patient is agreeable to continuing symptomatic treatment at home and following up with PCP.  Return precautions given    Patient is hemodynamically stable, in NAD, and able to ambulate in the ED. Evaluation does not show pathology that would require ongoing emergent intervention or inpatient treatment. I explained the diagnosis to the patient. Pain has been managed and has no complaints prior to discharge. Patient is comfortable with above plan and is stable  for discharge at this time. All questions were answered prior to disposition. Strict return precautions for returning to the ED  were discussed. Encouraged follow up with PCP.     An After Visit Summary was printed and given to the patient.    Portions of this note were generated with Scientist, clinical (histocompatibility and immunogenetics). Dictation errors may occur despite best attempts at proofreading.    Final Clinical Impression(s) / ED Diagnoses  Final diagnoses:   Strain of lumbar region, initial encounter     Rx / DC Orders  ED Discharge Orders            Ordered     methocarbamol (ROBAXIN) 500 MG tablet  2 times daily         02/04/22 1214     methylPREDNISolone (MEDROL DOSEPAK) 4 MG TBPK tablet  Status:  Discontinued         02/04/22 1214     methylPREDNISolone (MEDROL DOSEPAK) 4 MG TBPK tablet         02/04/22 1214                 Dietrich Pates, PA-C  02/04/22 1217      Alvira Monday, MD  02/04/22 2326    Electronically signed by Alvira Monday, MD at 02/04/2022 11:26 PM EDT    Associated attestation - Alvira Monday, MD - 02/04/2022 11:26 PM EDT  Formatting of this note might be different from the original.  Attestation: Medical screening examination/treatment/procedure(s) were performed by non-physician practitioner and as supervising physician I was immediately available for consultation/collaboration.

## 2022-02-04 NOTE — ED Triage Notes (Signed)
Formatting of this note might be different from the original.  Patient here with complain of lower back pain that started yesterday, greater on left side with radiation into left leg. Denies known injury but reports possibly having a seizure a few days ago, history of seizures. Denies urinary and bowel incontinence. Patient alert, oriented, ambulatory,and in no apparent distress at this time.  Electronically signed by Jeanette Caprice, RN at 02/04/2022  7:15 AM EDT

## 2022-02-04 NOTE — ED Notes (Signed)
Pt able to ambulate independently 

## 2022-02-04 NOTE — ED Notes (Signed)
Patient discharge instructions reviewed with the patient. The patient verbalized understanding of instructions. Patient discharged. 

## 2022-02-04 NOTE — ED Notes (Signed)
Pt able to ambulate independently, however the only assistance he needs occasionally is sitting up in the bed due to the pain in his back. ?

## 2022-02-04 NOTE — ED Provider Notes (Signed)
[facility] Provider Note   CSN: [id number] Arrival date & time: 02/04/22  [id number]     History  Chief Complaint  Patient presents with   Back Pain   Abdominal Pain    Carl HareCharles Kelner is a 41 y.o. male presenting to the ED with a chief complaint of back pain and abdominal pain.  States that he is having right lower back pain that radiates to his right flank and right abdomen as well as left-sided back pain that radiates down his left leg.  Symptoms have been going on for the past few days but worsened this morning to the point where he had pain with any type of movement.  He has not tried any medications to help with his symptoms.  He cannot recall any incident that may have triggered his symptoms other than possibly having a seizure a few days ago.  He does have a history of seizures.  Denies any urinary incontinence, dysuria, hematuria.  He reports nausea but denies any vomiting.  He does have a history of kidney stones.  Denies any chest pain, shortness of breath, numbness in arms or legs, falls   Back Pain Associated symptoms: abdominal pain   Associated symptoms: no chest pain, no dysuria, no fever and no weakness       Home Medications Prior to Admission medications   Medication Sig Start Date End Date Taking? Authorizing Provider  methocarbamol (ROBAXIN) 500 MG tablet Take 1 tablet (500 mg total) by mouth 2 (two) times daily. 02/04/22  Yes Lula Michaux, PA-C  azithromycin (ZITHROMAX) 250 MG tablet Take 1 tablet (250 mg total) by mouth daily. Take first 2 tablets together, then 1 every day until finished. 12/23/17   Elson AreasSofia, Leslie K, PA-C  benzonatate (TESSALON) 100 MG capsule Take 1 capsule (100 mg total) by mouth 3 (three) times daily as needed for cough. 06/29/21   Charlynne PanderYao, David Hsienta, MD  methylPREDNISolone (MEDROL DOSEPAK) 4 MG TBPK tablet Taper over 6 days. 02/04/22   Windsor Goeken, PA-C      Allergies    Penicillins    Review of Systems    Review of Systems  Constitutional:  Negative for appetite change, chills and fever.  HENT:  Negative for ear pain, rhinorrhea, sneezing and sore throat.   Eyes:  Negative for photophobia and visual disturbance.  Respiratory:  Negative for cough, chest tightness, shortness of breath and wheezing.   Cardiovascular:  Negative for chest pain and palpitations.  Gastrointestinal:  Positive for abdominal pain and nausea. Negative for blood in stool, constipation, diarrhea and vomiting.  Genitourinary:  Negative for dysuria, hematuria and urgency.  Musculoskeletal:  Positive for back pain. Negative for myalgias.  Skin:  Negative for rash.  Neurological:  Negative for dizziness, weakness and light-headedness.   Physical Exam Updated Vital Signs BP 118/72 (BP Location: Right Arm)    Pulse 71    Temp 98.7 F (37.1 C) (Oral)    Resp 20    SpO2 95%  Physical Exam Vitals and nursing note reviewed.  Constitutional:      General: He is not in acute distress.    Appearance: He is well-developed.  HENT:     Head: Normocephalic and atraumatic.     Nose: Nose normal.  Eyes:     General: No scleral icterus.       Right eye: No discharge.        Left eye: No discharge.     Conjunctiva/sclera: Conjunctivae normal.  Cardiovascular:     Rate and Rhythm: Normal rate and regular rhythm.     Heart sounds: Normal heart sounds. No murmur heard.   No friction rub. No gallop.  Pulmonary:     Effort: Pulmonary effort is normal. No respiratory distress.     Breath sounds: Normal breath sounds.  Abdominal:     General: Bowel sounds are normal. There is no distension.     Palpations: Abdomen is soft.     Tenderness: There is abdominal tenderness (RMQ, LLQ). There is right CVA tenderness and left CVA tenderness. There is no guarding.  Musculoskeletal:        General: Tenderness present. Normal range of motion.     Cervical back: Normal range of motion and neck supple.     Comments: No midline spinal  tenderness present in lumbar, thoracic or cervical spine. No step-off palpated. No visible bruising, edema or temperature change noted. No objective signs of numbness present. No saddle anesthesia. 2+ DP pulses bilaterally. Sensation intact to light touch. Strength 5/5 in bilateral lower extremities.  Skin:    General: Skin is warm and dry.     Findings: No rash.  Neurological:     Mental Status: He is alert.     Motor: No abnormal muscle tone.     Coordination: Coordination normal.    ED Results / Procedures / Treatments   Labs (all labs ordered are listed, but only abnormal results are displayed) Labs Reviewed  COMPREHENSIVE METABOLIC PANEL - Abnormal; Notable for the following components:      Result Value   Glucose, Bld 109 (*)    Calcium 8.5 (*)    Total Protein 6.3 (*)    All other components within normal limits  CBC WITH DIFFERENTIAL/PLATELET - Abnormal; Notable for the following components:   WBC 11.0 (*)    Neutro Abs 8.4 (*)    All other components within normal limits  URINALYSIS, ROUTINE W REFLEX MICROSCOPIC  LIPASE, BLOOD    EKG None  Radiology CT Renal Stone Study  Result Date: 02/04/2022 CLINICAL DATA:  Flank pain.  Kidney stone suspected. EXAM: CT ABDOMEN AND PELVIS WITHOUT CONTRAST TECHNIQUE: Multidetector CT imaging of the abdomen and pelvis was performed following the standard protocol without IV contrast. RADIATION DOSE REDUCTION: This exam was performed according to the departmental dose-optimization program which includes automated exposure control, adjustment of the mA and/or kV according to patient size and/or use of iterative reconstruction technique. COMPARISON:  None. FINDINGS: Lower chest: No acute abnormality. Hepatobiliary: No focal liver abnormality is seen. No gallstones, gallbladder wall thickening, or biliary dilatation. Pancreas: Unremarkable. No pancreatic ductal dilatation or surrounding inflammatory changes. Spleen: Normal in size without focal  abnormality. Adrenals/Urinary Tract: Normal adrenal glands. No kidney stones, hydronephrosis or mass identified bilaterally. No ureteral calculi identified. Urinary bladder is unremarkable. Stomach/Bowel: Stomach is within normal limits. Appendix appears normal. No evidence of bowel wall thickening, distention, or inflammatory changes. Vascular/Lymphatic: Normal appearance of the abdominal aorta. No aneurysm. No signs of abdominopelvic adenopathy. Reproductive: Prostate is unremarkable. Other: Small fat containing umbilical hernia, image 60/3. No abdominopelvic ascites. Musculoskeletal: No acute or significant osseous findings. IMPRESSION: 1. No acute findings within the abdomen or pelvis. No nephrolithiasis or signs of hydronephrosis. 2. Small fat containing umbilical hernia. Electronically Signed   By: Signa Kell M.D.   On: 02/04/2022 10:22    Procedures Procedures    Medications Ordered in ED Medications  sodium chloride 0.9 % bolus 1,000 mL (1,000 mLs  Intravenous New Bag/Given 02/04/22 0801)  acetaminophen (TYLENOL) tablet 650 mg (650 mg Oral Given 02/04/22 0802)  methocarbamol (ROBAXIN) tablet 500 mg (500 mg Oral Given 02/04/22 0802)  ondansetron (ZOFRAN) injection 4 mg (4 mg Intravenous Given 02/04/22 0802)  oxyCODONE-acetaminophen (PERCOCET/ROXICET) 5-325 MG per tablet 1 tablet (1 tablet Oral Given 02/04/22 1111)  ketorolac (TORADOL) 30 MG/ML injection 30 mg (30 mg Intravenous Given 02/04/22 1111)    ED Course/ Medical Decision Making/ A&P Clinical Course as of 02/04/22 1217  Mon Feb 04, 2022  0750 WBC(!): 11.0 [HK]  0845 Creatinine: 0.96 [HK]  0845 Lipase: 30 [HK]    Clinical Course User Index [HK] Dietrich Pates, PA-C                           Medical Decision Making Amount and/or Complexity of Data Reviewed Labs: ordered. Decision-making details documented in ED Course. Radiology: ordered.  Risk OTC drugs. Prescription drug management.   41 year old male presenting to the  ED for back pain, abdominal pain.  Reports right-sided lower back pain that radiates to his right flank and right abdomen as well as left lower back pain that radiates down his left leg.  Reports associated nausea.  Reports a history of kidney stones but he states that typically with his kidney stones he will have pain with urination.  He denies any dysuria, hematuria, vomiting, numbness, weakness.  On exam there is tenderness of the right and left lower abdomen without rebound or guarding.  There is also bilateral CVA tenderness as well as tenderness throughout his lower lumbar paraspinal musculature bilaterally.  There is no numbness or weakness of lower extremities.  Will obtain lab work including urinalysis and attempt to treat symptomatically.  Lab work interpreted by me.  He has a slight leukocytosis of 11 which I feel is reactive to his pain.  His CMP, urinalysis and lipase are unremarkable.  CT renal stone study due to pain location shows no acute findings.  Suspect that his symptoms are musculoskeletal in nature.  I doubt dissection, cauda equina or other surgical or emergent cause of his symptoms based on his reassuring work-up and physical exam findings.  He reports significant improvement with symptoms with medications given here.  He is ambulatory without difficulty.  Patient is agreeable to continuing symptomatic treatment at home and following up with PCP.  Return precautions given    Patient is hemodynamically stable, in NAD, and able to ambulate in the ED. Evaluation does not show pathology that would require ongoing emergent intervention or inpatient treatment. I explained the diagnosis to the patient. Pain has been managed and has no complaints prior to discharge. Patient is comfortable with above plan and is stable for discharge at this time. All questions were answered prior to disposition. Strict return precautions for returning to the ED were discussed. Encouraged follow up with PCP.    An After Visit Summary was printed and given to the patient.   Portions of this note were generated with Scientist, clinical (histocompatibility and immunogenetics). Dictation errors may occur despite best attempts at proofreading.         Final Clinical Impression(s) / ED Diagnoses Final diagnoses:  Strain of lumbar region, initial encounter    Rx / DC Orders ED Discharge Orders          Ordered    methocarbamol (ROBAXIN) 500 MG tablet  2 times daily        02/04/22 1214  methylPREDNISolone (MEDROL DOSEPAK) 4 MG TBPK tablet  Status:  Discontinued        02/04/22 1214    methylPREDNISolone (MEDROL DOSEPAK) 4 MG TBPK tablet        02/04/22 1214              Dietrich Pates, PA-C 02/04/22 1217    Alvira Monday, MD 02/04/22 2326

## 2022-02-04 NOTE — Discharge Instructions (Addendum)
Take the medications to help with your symptoms. ?Follow-up with your primary care provider or the 1 listed below. ?Return to the ER if you start to experience worsening symptoms, trouble walking, numbness in your legs, losing control of your bowels or bladder, chest pain or shortness of breath ?

## 2022-02-04 NOTE — ED Triage Notes (Signed)
Patient here with complain of lower back pain that started yesterday, greater on left side with radiation into left leg. Denies known injury but reports possibly having a seizure a few days ago, history of seizures. Denies urinary and bowel incontinence. Patient alert, oriented, ambulatory,and in no apparent distress at this time. ?

## 2022-09-05 NOTE — Other (Signed)
Formatting of this note might be different from the original.  Hida scan is normal    I have reviewed this result. Please contact the patient.  Electronically signed by Ennis Forts, MD at 09/06/2022  1:17 PM EDT

## 2022-10-31 ENCOUNTER — Ambulatory Visit: Admit: 2022-10-31 | Discharge: 2022-10-31 | Payer: PRIVATE HEALTH INSURANCE | Attending: Neurology

## 2022-10-31 ENCOUNTER — Inpatient Hospital Stay: Payer: PRIVATE HEALTH INSURANCE

## 2022-10-31 DIAGNOSIS — G43009 Migraine without aura, not intractable, without status migrainosus: Secondary | ICD-10-CM

## 2022-10-31 DIAGNOSIS — E236 Other disorders of pituitary gland: Secondary | ICD-10-CM

## 2022-10-31 LAB — CBC
Hematocrit: 47.5 % (ref 40.7–50.3)
Hemoglobin: 16.1 g/dL (ref 13.0–17.0)
MCH: 29.3 pg (ref 25.2–33.5)
MCHC: 33.9 g/dL — ABNORMAL HIGH (ref 25.2–33.5)
MCV: 86.4 fL (ref 82.6–102.9)
MPV: 9.9 fL (ref 8.1–13.5)
NRBC Automated: 0 per 100 WBC
Platelets: 265 10*3/uL (ref 138–453)
RBC: 5.5 m/uL (ref 4.21–5.77)
RDW: 12.5 % (ref 11.8–14.4)
WBC: 9.2 10*3/uL (ref 3.5–11.3)

## 2022-10-31 LAB — ELECTROLYTE PANEL
Anion Gap: 9 mmol/L (ref 9–17)
CO2: 25 mmol/L (ref 20–31)
Chloride: 104 mmol/L (ref 98–107)
Potassium: 4 mmol/L (ref 3.7–5.3)
Sodium: 138 mmol/L (ref 135–144)

## 2022-10-31 LAB — AST: AST: 19 U/L (ref <40)

## 2022-10-31 LAB — VALPROIC ACID LEVEL, TOTAL: Valproic Acid Lvl: <3 ug/mL — ABNORMAL LOW (ref 50–125)

## 2022-10-31 LAB — TSH: TSH: 0.96 u[IU]/mL (ref 0.30–5.00)

## 2022-10-31 MED ORDER — BUTALBITAL-APAP-CAFFEINE 50-325-40 MG PO TABS
50-325-40 MG | ORAL_TABLET | ORAL | 1 refills | Status: DC
Start: 2022-10-31 — End: 2022-10-31

## 2022-10-31 MED ORDER — UBRELVY 100 MG PO TABS
100 MG | ORAL_TABLET | Freq: Two times a day (BID) | ORAL | 2 refills | Status: AC | PRN
Start: 2022-10-31 — End: 2023-04-24

## 2022-10-31 MED ORDER — DIVALPROEX SODIUM 500 MG PO TBEC
500 MG | ORAL_TABLET | Freq: Three times a day (TID) | ORAL | 5 refills | Status: DC
Start: 2022-10-31 — End: 2023-04-24

## 2022-10-31 MED ORDER — SUMATRIPTAN SUCCINATE 100 MG PO TABS
100 MG | ORAL_TABLET | ORAL | 2 refills | Status: AC
Start: 2022-10-31 — End: 2023-04-24

## 2022-10-31 MED ORDER — BUTALBITAL-APAP-CAFFEINE 50-325-40 MG PO TABS
50-325-40 MG | ORAL_TABLET | ORAL | 1 refills | Status: AC
Start: 2022-10-31 — End: 2023-04-24

## 2022-10-31 NOTE — Patient Instructions (Signed)
*   SEIZURE  PRECAUTIONS.                 A)  Avoid  Working  At   Heights.          B)  Avoid  Working  With  Heavy machinery.          C)  Avoid   Swimming,  Climbing  A  Ladder   Unattended.          D)  Avoid   Driving   If  You   Have  A  Seizure.          E)  Must   Be  Seizure  Free   For  At   Least   6 months,  Before   You  Can drive.                                                                                                                                    F) Some times  Your  May  Feel  Seizure coming  Before  It  Begins.  You  May feel             Strange smell or funny  Feeling  In  Your  Stomach,  Which is  Called   Aura.                     TIPS  TO  REDUCE/ PREVENT  SEIZURES         1.  Take  Your  Anti seizure  Medications   As   Recommended.       2. Get   Enough   Sleep.   Sleep  Deprivation   Can  Trigger  A  Seizure.       3. If   You   Have  A fever,  Treat  It  At  Once,  And  Contact   Your  Primary  Care Providers.                                                                                                                           4. Avoid   Alcohol.       5. Avoid  Flashing  Lights,  Loud  Noises and  TV  And  Video  Games,             As   These  May  Trigger   Your  Seizures       6.  Control  Your  Stress  And   Have  Adequate  Rest.       7.   If  You  Feel  A  Seizure  Coming   On :           A) warn people  Who  Are  With  You           B)  Make  Sure  There  Are  No  Sharp or  Hard  Objects  Around you.           C)  Lay down  On  Your  Side  And  Relax.                * FALL   PRECAUTIONS.              *   ADEQUATE   FLUID  INTAKE   AND  ELECTROLYTE  BALANCE             * KEEP  DAIRY  OF   THE  NEUROLOGICAL  SYMPTOMS          *  TO  MAINTAIN  REGULAR  SLEEP  WAKE  CYCLES.     *   TO  HAVE  ADEQUATE  REST  AND   SLEEP    HOURS.          *    AVOID  USAGE OF   TOBACCO,  EXCESSIVE  ALCOHOL                AND   ILLEGAL   SUBSTANCES,  IF  ANY          *  Maintain   Healthy  Life  Style    With   Periodic  Monitoring  Of         Any  Medical  Conditions  Including   Elevated  Blood  Pressure,  Lipid  Profile,       Blood  Sugar levels  And   Heart  Disease.                *   Period   Screening  For  Cancers  Involving  Breast,  Colon,         Lungs  And  Other  Organs  As  Applicable,           In consultation   With  Your  Primary Care Providers.                *  If   There is  Any  Significant  Worsening   Of  Current  Symptoms  And             Or  If    Any additional  New  Neurological  Symptoms  and          Significant  Concerns   Should  Call  911 or  Go  To  Emergency  Department            For  Further  Immediate  Evaluation.

## 2022-10-31 NOTE — Progress Notes (Signed)
[facility]  Neurology    [address], [place]  [address]  Phone:[phone]   Fax: [phone]        SUBJECTIVE:       PATIENT ID:  Larry Koch is a  RIGHT    HANDED 41 y.o. male.      Migraine   This is a chronic problem. Episode onset: FOR   15  YEARS. The problem occurs intermittently. The problem has been waxing and waning. Associated symptoms include back pain, blurred vision, dizziness, insomnia, a loss of balance, neck pain, numbness, phonophobia, photophobia, tingling, a visual change and weakness. Pertinent negatives include no abdominal pain, abnormal behavior, anorexia, coughing, drainage, ear pain, eye pain, eye redness, eye watering, facial sweating, fever, hearing loss, muscle aches, nausea, rhinorrhea, scalp tenderness, seizures, sinus pressure, sore throat, swollen glands, tinnitus, vomiting or weight loss. The symptoms are aggravated by unknown. He has tried nothing for the symptoms. The treatment provided no relief.   Seizures  This is a chronic problem. Episode onset: SINCE    2015  The problem occurs intermittently (DURING     HIS  SLEEP  NIGHT  TIME  ). The problem has been waxing and waning. Associated symptoms include diaphoresis, neck pain, numbness, a visual change and weakness. Pertinent negatives include no abdominal pain, anorexia, arthralgias, change in bowel habit, chest pain, chills, congestion, coughing, fatigue, fever, headaches, joint swelling, myalgias, nausea, rash, sore throat, swollen glands, urinary symptoms, vertigo or vomiting. Associated symptoms comments: MEMORY   PROBLEMS  . Nothing aggravates the symptoms. He has tried nothing for the symptoms. The treatment provided no relief.   Cerebrovascular Accident  This is a chronic problem. Episode onset: SINCE   2017   The problem has been resolved. Associated symptoms include diaphoresis, neck pain, numbness, a visual change and weakness. Pertinent negatives include no abdominal pain,  anorexia, arthralgias, change in bowel habit, chest pain, chills, congestion, coughing, fatigue, fever, headaches, joint swelling, myalgias, nausea, rash, sore throat, swollen glands, urinary symptoms, vertigo or vomiting. Associated symptoms comments: RESIDUAL   LEFT   HEMIPARESIS  . Nothing aggravates the symptoms. He has tried nothing for the symptoms. The treatment provided no relief.             History obtained from  The   PATIENT         and other  available   medical  records   were  Also  reviewed.            The  Duration,  Quality,  Severity,  Location,  Timing,  Context,  Modifying  Factors   Of   The   Chief   Complaint       And  Present  Illness  Was   Reviewed   In   Chronological   [place].                                            PATIENT'S  MAIN  CONCERNS INCLUDE :                       1)    KNOWN     H/O    MIGRAINES    AND  HEADACHES  FOR     20   YEARS                               2)     H/O   MIGRAINES   AND    HEADACHES                              ASSOCIATED     WITH   NAUSEA ,    VOMITING,  PHOTOPHOBIA                                 PAIN   IN    THE   LEFT    EYE                                                             3)          H/O    SEIZURES       DURING   NIGHT   TIME         SINCE                                          2015        AS    WITNESSED   BY    HIS   EX   WIFE                                4)        FAMILY     HISTORY    OF   SEIZURES                                         -   MATERNAL  SIDE                            5)     NO     PREVIOUS    H/O    HEAD INJURY.                               NO   DEFINITE    PRECIPITATING  FACTORS     SEIZURES    REPORTED                             6)        PREVIOUS     STROKE       IN    2017                                     WITH   HOSPITALIZATION  FOR  TWO  WEEKS                                  H/O     MILD   LEFT      RESIDUAL     HEMIPARESIS                                   -    PATIENT  ADVISED     TO   ASA   81   MG  PO  DAILY                          7)      PREVIOUS     H/O   NEUROLOGY    EVALUATIONS      AT                                  [facility]                          BEING     FOLLOWED    BY    [facility]   F[facility]ILITY    AT   [facility]  [facility]   IN                            8)       PATIENT     DENIES      ANY    TOBACCO ,  ALCOHOL                                    OR     ILLEGAL   DRUG   USAGE                                 9)       H/O    CHRONIC    ANXIETY,   DEPRESSION ,  BIPOLAR  DISORDER                                                  FOR   20   YEARS                             -          ON    EFFEXOR,   TRAZODONE     SEROQUEL,   PRAZOSIN                                     BEING        FOLLOWED   WITH    MENTAL  HEALTH  PROFESSION[facility]  AT    [facility]                          10)      CO  MORBID  MULTIPLE  MEDICAL   CONDITION                                         TO  FOLLOW  WITH    HIS  PCP                      11)       H/O    CHRONIC     MILD    MEMORY  PROBLEMS                                         -    STABLE                                    NEEDS  MONITORING                     12)   H/O    MILD     BALANCE  PROBLEMS                          13)      H/O     CHRONIC    MILD  BRIEF    DIZZINESS                                      INTERMITTENTLY    -     STABLE                                              14)       HAD    NEURO  DIAGNOSTIC  EVALUATIONS  IN   DEC  2021/  JAN. 2022                                              A)   MRI  BRAIN,   CAROTID  DOPPLER                                     NO    SIGNIFICANT  ABNORMALITIES                                B)    TCD        SHOWED                                 mild-to-moderate  right middle cerebral artery stenosis.                                 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                              C)     LABS     INCLUDING    HYPER COAGULABLE   WORK                                      SHOWED       SHOWED    NO  SIGNIFICANT  ABNORMALITIES                             D)         EEG     SHOWED                                       mild-to-moderate irritable diffuse cerebral dysfunction                             E)     EMG / NC  STUDIES   SHOWED                                        SENSORY PERIPHERAL  POLYNEUROPATHY                                           MORE  SO   BOTH  LOWER   EXTREMITIES                                    -    ABOVE   RESULTS    REVIEWED     AND  DISCUSSED    WITH  PATIENT                          15)       H/O     INTOLERANCE   TO   NSAID     MEDICATION                         16)      H/O     WAKING  UP     EPISODES     ON  BATH   ROOM   FLOOR                                 SINCE         FEB.  /  MARCH     2022        ONCE  WEEK  WITH      H/O                                  DISTURBED      OBJECTS  IN   BATH  ROOM     AND                                     FURNITURE  IN    BED TIME    ROOM                                  D / D     INCLUDE                                       -    NOCTURNAL   SEIZURE   EPISODES                                         -    SLEEP   WALKING                               PATIENT    BEING    FOLLOWED  BY    [facility]                       17)      PATIENT  DID  NOT   HAVE  NEUROLOGY  FOLLOW  UP   FROM                        MAY   2022      TO  NOV.  2023     AS    [place]                18)     PATIENT  RETURNED  BACK  TO   [place]                            WITH    CONTINUED  MIGRAINES    AND  WAKING  EPISODES                           ON  FLOOR   ONCE  A  WEEK    AND  MEMORY PROBLEMS                               19)         RECOMMENDATIONS  :                                 A)    FALL PRECAUTIONS  B)   SEIZURE  PRECAUTIONS                               C)    TO  CONTINUE   DEPAKOTE    500   MG    PO   TID                                             FOR   MIGRAINE    AND   SEIZURE    MANAGEMENT                               D)     ASA    81   MG  PO    DAILY                                  E)     IMITREX  ,   UBRELVY       AS   NEEDED                                             FOR   MIGRAINES  HEADACHES                               F)    DEPAKOTE  LEVEL    PERIODICALLY                              G)    FOLLOW  UP   NEURODIAGNOSTIC  EVALUATIONS                               H)    FOLLOW  UP    WITH    PCP,    SLEEP   CONSULTANTS                                           AND  MENTAL  HEALTH PROFESSIONALS                                                             --   REVIEWED     AND  DISCUSSED    WITH  PATIENT  20)       VARIOUS  RISK   FACTORS   WERE  REVIEWED   AND   DISCUSSED.                       PATIENT   HAS  MULTIPLE   MEDICAL, NEUROLOGICAL                        AND   MENTAL HEALTH   PROBLEMS .                    PATIENT'S   MANAGEMENT  IS  CHALLENGING.                                        PRECIPITATING  FACTORS: including  fever/infection, exertion/relaxation, position change, stress,                weather change,   medications/alcohol, time of day/darkness/light  Are  present                                                          MODIFYING  FACTORS:  fever/infection, exertion/relaxation, position change, stress, weather change,               medications/alcohol, time of day/darkness/light  Are  present                Patient   Indicates   The  Presence   And  The  Absence  Of  The  Following    Associated  And             Additional  Neurological    Symptoms:                                Balance  And coordination   problems  present           Gait problems     present            Headaches      present              Migraines           present           Memory problemspresent              Confusion        absent            Paresthesia    numbness          present           Seizures  And  Starring  Episodes           present           Syncope,  Near  syncopal episodes         absent           Speech   problems           absent             Swallowing   Problems  absent            Dizziness,  Light headedness           present              Vertigo        absent             Generalized   Weakness    absent              focal  Weakness     present             Tremors         absent              Sleep  Problems     present             History  Of   Recent  Head  Injury     absent             History  Of   Recent  TIA     absent             History  Of   Recent    Stroke     absent             Neck  Pain   and   Neck muscle  Spasms  present               Radiating  down   And   Weakness           present            Lower back   Pain  And     Spasms  present              Radiating    Down   And   Weakness          present                H/OFALLS        present               History  Of   Visual  Symptoms    absent                  Associated   Diplopia       absent                                               Also   Additional   Symptoms   Present    As  Documented    In   The   detailed                  Review  Of  Systems   And    Please   Refer   To    Them for   Additional    Information.                    Any components  That are either  Unobtainable  Or  Limited  In   HPI, ROS  And/or PFSH   Are                   Due   ToPatient's  Medical  Problems,  Clinical  Condition   and/or lack of                                 other    Alternate   resources.            RECORDS   REVIEWED:    historical medical records           INFORMATION   REVIEWED:     MEDICAL   HISTORY,SURGICAL   HISTORY,     MEDICATIONS   LIST,   ALLERGIES AND  DRUG  INTOLERANCES,       FAMILY   HISTORY,  SOCIAL  HISTORY,      PROBLEM  LIST   FOR  PATIENT  CARE   COORDINATION          Past Medical History:   Diagnosis Date    Anxiety     Cerebral artery occlusion with cerebral  infarction (HCC) 2017    GERD (gastroesophageal reflux disease)     Glaucoma     Personality disorder (HCC)     PTSD (post-traumatic stress disorder)     PUD (peptic ulcer disease)     Seizures (HCC)          Past Surgical History:   Procedure Laterality Date    TONSILLECTOMY           Current Outpatient Medications   Medication Sig Dispense Refill    pantoprazole (PROTONIX) 40 MG tablet 1 tablet      vitamin D (CHOLECALCIFEROL) 25 MCG (1000 UT) TABS tablet 50 mcg      prazosin (MINIPRESS) 2 MG capsule 1 capsule      dicyclomine (BENTYL) 10 MG capsule Take 2 capsules by mouth 4 times daily (before meals and nightly)      colestipol (COLESTID) 1 g tablet Take 1 tablet by mouth 2 times daily      traZODone (DESYREL) 100 MG tablet 1 tablet      famotidine (PEPCID) 20 MG tablet TAKE TWO TABLETS BY MOUTH AT BEDTIME      divalproex (DEPAKOTE) 500 MG DR tablet Take 1 tablet by mouth 3 times daily ONE  TABLET    3      TIMES  DAILY 90 tablet 5    SUMAtriptan (IMITREX) 100 MG tablet ONE   TABLET     TWICE  DAILY  AS  NEEDED   FOR  MIGRAINES 12 tablet 2    Ubrogepant (UBRELVY) 100 MG TABS Take 100 mg by mouth 2 times daily as needed (FOR  MIGRAINE) 30 tablet 2    butalbital-acetaminophen-caffeine (ESGIC) 50-325-40 MG per tablet ONE  TO  TWO  TABLETS  IN  THE  EVENING  AND  BED TIME  AS  NEEDED 60 tablet 1    QUEtiapine (SEROQUEL) 25 MG tablet Take 8 tablets by mouth daily Taking 1/2 tablet daily.      venlafaxine (EFFEXOR XR) 37.5 MG extended release capsule TAKING 225MG  DAILY      cloNIDine (CATAPRES) 0.1 MG tablet TAKE ONE TABLET BY MOUTH AT BEDTIME FOR BLOOD PRESSURE (Patient not taking: Reported on 10/31/2022)      Multiple Vitamins-Minerals (MULTIVITAMINS/MINERALS ADULT PO) TAKE 1 TABLET BY MOUTH ONCE DAILY FOR SUPPLEMENTATION (Patient not taking: Reported on 10/31/2022)      omeprazole (PRILOSEC) 20 MG delayed release capsule Take 20 mg by mouth 2 times daily (Patient not taking: Reported on 10/31/2022)  No current  facility-administered medications for this visit.         Allergies   Allergen Reactions    Blueberry Anaphylaxis    Other      blueberries, raspberries, blackberries. Vaccination reaction.    Penicillins     Sulfamethoxazole-Trimethoprim Nausea Only    Food Nausea And Vomiting     Blue cheese per Pt.         History reviewed. No pertinent family history.      Social History     Socioeconomic History    Marital status: Married     Spouse name: Not on file    Number of children: Not on file    Years of education: Not on file    Highest education level: Not on file   Occupational History    Not on file   Tobacco Use    Smoking status: Never    Smokeless tobacco: Never   Vaping Use    Vaping Use: Never used   Substance and Sexual Activity    Alcohol use: Not Currently     Comment: 1 drink per year    Drug use: No    Sexual activity: Yes     Partners: Female   Other Topics Concern    Not on file   Social History Narrative    Not on file     Social Determinants of Health     Financial Resource Strain: Not on file   Food Insecurity: Not on file   Transportation Needs: Not on file   Physical Activity: Not on file   Stress: Not on file   Social Connections: Not on file   Intimate Partner Violence: Not on file   Housing Stability: Not on file       Vitals:    10/31/22 1134   BP: 126/76   Pulse: 62   Resp: 16   SpO2: 96%         Wt Readings from Last 3 Encounters:   10/31/22 106.6 kg (235 lb)   02/26/21 109.8 kg (242 lb)   12/20/20 109.8 kg (242 lb)         BP Readings from Last 3 Encounters:   10/31/22 126/76   02/26/21 118/80   12/20/20 112/76           Hematology and Coagulation    Lab Results   Component Value Date/Time    WBC 7.5 12/15/2020 05:15 PM    RBC 5.26 12/15/2020 05:15 PM    HGB 15.2 12/15/2020 05:15 PM    HCT 45.1 12/15/2020 05:15 PM    MCV 85.7 12/15/2020 05:15 PM    MCH 28.9 12/15/2020 05:15 PM    MCHC 33.7 12/15/2020 05:15 PM    RDW 12.6 12/15/2020 05:15 PM    PLT 278 12/15/2020 05:15 PM    MPV 9.9  12/15/2020 05:15 PM         Chemistries    Lab Results   Component Value Date/Time    NA 142 12/15/2020 05:15 PM    K 3.7 12/15/2020 05:15 PM    CL 108 12/15/2020 05:15 PM    CO2 25 12/15/2020 05:15 PM    BUN 9 12/15/2020 05:15 PM    CREATININE 0.79 12/15/2020 05:15 PM    CALCIUM 9.2 12/15/2020 05:15 PM    PROT 6.6 06/22/2016 10:45 PM    LABALBU 3.8 06/22/2016 10:45 PM    BILITOT 0.2 06/22/2016 10:45 PM    ALKPHOS 74 06/22/2016 10:45  PM    AST 14 06/22/2016 10:45 PM    ALT 13 06/22/2016 10:45 PM     Lab Results   Component Value Date/Time    ALKPHOS 74 06/22/2016 10:45 PM    ALT 13 06/22/2016 10:45 PM    AST 14 06/22/2016 10:45 PM    PROT 6.6 06/22/2016 10:45 PM    BILITOT 0.2 06/22/2016 10:45 PM    LABALBU 3.8 06/22/2016 10:45 PM     Lab Results   Component Value Date/Time    BUN 9 12/15/2020 05:15 PM    CREATININE 0.79 12/15/2020 05:15 PM     Lab Results   Component Value Date/Time    CALCIUM 9.2 12/15/2020 05:15 PM     Lab Results   Component Value Date/Time    AST 14 06/22/2016 10:45 PM    ALT 13 06/22/2016 10:45 PM         Review of Systems   Constitutional:  Positive for diaphoresis. Negative for appetite change, chills, fatigue, fever, unexpected weight change and weight loss.   HENT:  Negative for congestion, dental problem, drooling, ear discharge, ear pain, facial swelling, hearing loss, mouth sores, nosebleeds, postnasal drip, rhinorrhea, sinus pressure, sore throat, tinnitus, trouble swallowing and voice change.    Eyes:  Positive for blurred vision and photophobia. Negative for pain, discharge, redness, itching and visual disturbance.   Respiratory:  Negative for apnea, cough, choking, chest tightness, shortness of breath and wheezing.    Cardiovascular:  Negative for chest pain, palpitations and leg swelling.   Gastrointestinal:  Negative for abdominal distention, abdominal pain, anorexia, blood in stool, change in bowel habit, constipation, diarrhea, nausea and vomiting.   Endocrine: Negative for cold  intolerance, heat intolerance, polydipsia, polyphagia and polyuria.   Musculoskeletal:  Positive for back pain, gait problem and neck pain. Negative for arthralgias, joint swelling, myalgias and neck stiffness.   Skin:  Negative for color change, pallor, rash and wound.   Allergic/Immunologic: Negative for environmental allergies, food allergies and immunocompromised state.   Neurological:  Positive for dizziness, tingling, weakness, light-headedness, numbness and loss of balance. Negative for vertigo, tremors, seizures, syncope, facial asymmetry, speech difficulty and headaches.   Hematological:  Negative for adenopathy. Does not bruise/bleed easily.   Psychiatric/Behavioral:  Positive for decreased concentration and sleep disturbance. Negative for agitation, behavioral problems, confusion, dysphoric mood, hallucinations, self-injury and suicidal ideas. The patient is nervous/anxious and has insomnia. The patient is not hyperactive.          OBJECTIVE:      Physical Exam  Constitutional:       Appearance: He is well-developed.   HENT:      Head: Normocephalic and atraumatic. No raccoon eyes or Battle's sign.      Right Ear: External ear normal.      Left Ear: External ear normal.      Nose: Nose normal.   Eyes:      Conjunctiva/sclera: Conjunctivae normal.      Pupils: Pupils are equal, round, and reactive to light.   Neck:      Thyroid: No thyroid mass or thyromegaly.      Vascular: No carotid bruit.      Trachea: No tracheal deviation.      Meningeal: Brudzinski's sign and Kernig's sign absent.   Cardiovascular:      Rate and Rhythm: Normal rate and regular rhythm.   Pulmonary:      Effort: Pulmonary effort is normal.   Musculoskeletal:  General: No tenderness.      Cervical back: Normal range of motion and neck supple. No rigidity. No muscular tenderness. Normal range of motion.   Skin:     General: Skin is warm.      Coloration: Skin is not pale.      Findings: No erythema or rash.      Nails: There is  no clubbing.   Psychiatric:         Attention and Perception: He is attentive.         Mood and Affect: Mood is not anxious or depressed. Affect is not labile, blunt or inappropriate.         Speech: He is communicative. Speech is not rapid and pressured, delayed, slurred or tangential.         Behavior: Behavior is not agitated, slowed, aggressive, withdrawn, hyperactive or combative. Behavior is cooperative.         Thought Content: Thought content is not paranoid or delusional. Thought content does not include homicidal or suicidal ideation. Thought content does not include homicidal or suicidal plan.         Cognition and Memory: Memory is impaired. He exhibits impaired recent memory. He does not exhibit impaired remote memory.         Judgment: Judgment is not impulsive or inappropriate.               NEUROLOGICALEXAMINATION :      A) MENTAL STATUS:                   Alert and  oriented  To time, place  And  Person.                  No Aphasia.  No  Dysarthria.                    Able   To  Follow     SIMPLE    commands   without   Any  Difficulty.                   No right  To left confusion.                  Normal  Speech  And language function.                   Insight and  Judgment ,Fund  Of  Knowledge   within normal limits                Recent  And  Remote memory    DECREASED                Attention &  Concentration are        DECREASED                                            B) CRANIAL NERVES :        CN : Visual  Acuity  And  Visual fields  within normal limits               Fundi  Could  Not  Be  Could  Not  Be  Evaluated.           3,4,6 CN : Both  Pupils are   Reactive and  Equal.  Movements  Are  Intact.                             No  Nystagmus.  No  INO.  No  Afferent  Pupillary  Defect noted.          5 CN :  Normal  Facial sensations and Corneal  Reflexes           7 CN:  Normal  Facial  Symmetry  And  Strength.  No facial  Weakness.           8 CN :  Hearing  Appears within normal  limits          9, 10 CN: Normal   spontaneous, reflex   palate   movements         11 CN:   Normal  Shoulder  shrug and  strength         12 CN :   Normal  Tongue movements and  Tongue  In midline                        No tongue   Fasciculations or atrophy         C) MOTOR  EXAM:                   Strength  In upper  And  Lower   extremities     DECREASED                          MILD   RESIDUAL  LEFT  HEMIPARESIS                             DECREASED   HAND  GRIPS                   No  Drift.     No  Atrophy               Rapid   alternating  And  repetitions  Movements   DECREASED                 Muscle  Tone  In upper  And  Lower  Extremities  normal                No rigidity.  No  Spasticity.                 Bradykinesia   absent                 No  Asterixis.              Sustention  Tremor , Resting   Tremor   absent                    No   other  Abnormal  Movements noted           D) SENSORY :               Light   touch, pinprick,   position  And  Vibration    DECREASED      E) REFLEXES:                   Deep  Tendon  Reflexes  DECREASED                                              F) COORDINATION  AND  GAIT :                                Station and  Gait    SLOW     WITH  MILD  LIMP                           Romberg 's test    POSITIVE                            Ataxia negative          ASSESSMENT:        Patient Active Problem List   Diagnosis    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)    Bipolar II disorder, most recent episode major depressive (HCC)    Borderline personality disorder (HCC)    Seizure disorder (HCC)    Headache    Insomnia    Low back pain    Lumbar spondylosis    Migraine without aura and without status migrainosus, not intractable    Obstructive sleep apnea syndrome in adult    Pituitary cyst (HCC)    Restless legs    Sleep apnea    H/O: stroke    Chronic tension-type headache, intractable    Memory problem    Dizziness    Chronic neck pain     Abnormal EEG    Chronic cerebral ischemia    Middle cerebral artery stenosis, right    Parasomnia       MRI OF THE BRAIN WITHOUT CONTRAST  10/23/2020 8:32 am       TECHNIQUE:   Multiplanar multisequence MRI of the brain was performed without the   administration of intravenous contrast.       COMPARISON:   None.       HISTORY:   ORDERING SYSTEM PROVIDED HISTORY: Bipolar II disorder, most recent episode   major depressive (HCC)   TECHNOLOGIST PROVIDED HISTORY:   migraine       Initial evaluation.       FINDINGS:   INTRACRANIAL STRUCTURES/VENTRICLES: There is no acute infarct. No mass effect   or midline shift. No evidence of an acute intracranial hemorrhage.  The   ventricles and sulci are normal in size and configuration.  The   sellar/suprasellar regions appear unremarkable.  The normal signal voids   within the major intracranial vessels appear maintained.       ORBITS: The visualized portion of the orbits demonstrate no acute abnormality.       SINUSES: Mucous retention cysts seen in the left maxillary sinus.  The   mastoid air cells demonstrate no acute abnormality.       BONES/SOFT TISSUES: The bone marrow signal intensity appears normal. The soft   tissues demonstrate no acute abnormality.           Impression   No acute intracranial abnormality.  No acute infarct.               VISITING DIAGNOSIS:      ICD-10-CM  1. Migraine without aura and without status migrainosus, not intractable  G43.009 divalproex (DEPAKOTE) 500 MG DR tablet      2. Bipolar II disorder, most recent episode major depressive (HCC)  F31.81 divalproex (DEPAKOTE) 500 MG DR tablet      3. Seizure disorder (HCC)  G40.909 divalproex (DEPAKOTE) 500 MG DR tablet      4. 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 Borderline personality disorder (HCC)  F60.3 divalproex (DEPAKOTE) 500 MG DR tablet      7. Chronic neck pain  M54.2 divalproex (DEPAKOTE)  500 MG DR tablet    G89.29       8. H/O: stroke  Z86.73 divalproex (DEPAKOTE) 500 MG DR tablet      9. Primary insomnia  F51.01 divalproex (DEPAKOTE) 500 MG DR tablet      10. Other sleep apnea  G47.39 divalproex (DEPAKOTE) 500 MG DR tablet      11. Low back pain with sciatica, sciatica laterality unspecified, unspecified back pain laterality, unspecified chronicity  M54.40 divalproex (DEPAKOTE) 500 MG DR tablet      12. Memory problem  R41.3 divalproex (DEPAKOTE) 500 MG DR tablet      13. Chronic cerebral ischemia  I67.82       14. Abnormal EEG  R94.01       15. Obstructive sleep apnea syndrome in adult  G47.33 divalproex (DEPAKOTE) 500 MG DR tablet      16. Chronic tension-type headache, intractable  G44.221 divalproex (DEPAKOTE) 500 MG DR tablet      17. Parasomnia, unspecified type  G47.50                    CONCERNS   &   INCREASED   RISK   FOR        * TIA,  CEREBRO  VASCULAR  ISCHEMIA, STROKE     *   DIZZINESS,   VERTEBROBASILAR  INSUFFICIENCY ,         *  SEIZURE  ACTIVITY,  EPILEPSY ,        *   COMPLICATED  MIGRAINES      *    CHRONIC  TENSION  HEADACHES      *   COGNITIVE  &   MEMORY PROBLEMS        *  MONONEUROPATHIES,   RADICULOPATHY       *   PERIPHERAL  NEUROPATHY,       *  GAIT  DIFFICULTIES  &   BALANCE PROBLMES   AND  FALL                VARIOUS  RISK   FACTORS   WERE  REVIEWED   AND   DISCUSSED.          *  PATIENT   HAS  MULTIPLE   MEDICAL, NEUROLOGICAL                        AND   MENTAL HEALTH   PROBLEMS .           PATIENT'S   MANAGEMENT  IS  CHALLENGING.            PLAN:                         *  Ashby Dawes  Of  The  Diagnoses,  The  Management & Treatment  Options            AND    Care  plan  Were          Reviewed and   Discussed   With  patient.           * Goals  And  Expectations  Of  The  Therapy  Discussed   And  Reviewed.          *   Benefits   And   Side  Effect  Profile  Of  Medication/s   Were   Discussed                * Need   For  Further   Follow up For  The  Various  Problems  Were  discussed.          * Results  Of  The  Previous  Diagnostic tests were reviewed and  discussed                   Medical  Decision  Making  Was  Made  Based on the   Complexity  Of  Above  Mentioned  Diagnoses,    Data reviewed             And    Risk  Of  Significant   Co morbidities and   complicating   Factors.                 Medical  Decision  Was     High    Complexity   Due   To  The  Patient's  Multiple  Symptoms  &  Disease,            Complex  Treatment  Regimen,  Multiple medications           and   Risk  Of   Side  Effects,  Difficulty  In  Medication  Management  And  Diagnostic  Challenges           In  View  Of  The  Associated   Co  Morbid  Conditions   And  Problems.                           * FALL   PRECAUTIONS.      THESE  REVIEWED   AND  DISCUSSED      *  USE   WALKING  ASSISTANCE  DEVICES     QUAD  CANE        *   BE  CAREFUL  WITH  ACTIVITIES           *   AVOID   NECK  AND/ BACK  STRAINING  ACTIVITIES            *   ADEQUATE   FLUID  INTAKE   AND  ELECTROLYTE  BALANCE           * KEEP  DAIRY  OF   THE  NEUROLOGICAL  SYMPTOMS        RECORDING THE    DURATION  AND  FREQUENCY.          *  AVOID    CONDITIONS  AND  FACTORS   THAT  MAKE  NEUROLOGICAL  SYMPTOMS  WORSE.        *   SEIZURE  PRECAUTIONS.                 A)  Avoid  Working  At   [employer].          B)  Avoid  Working  With  Engineer, manufacturing systems.          C)  Avoid   Swimming,  Climbing  A  Ladder   Unattended.          D)  Avoid   Driving   If  You   Have  A  Seizure.          E)  Must   Be  Seizure  Free   For  At   Least   6 months,  Before   You  Can drive.          F) Some times  Your  May  Feel  Seizure coming  Before  It  Begins.                You  May feelsmell or funny  Feeling  In  Your  Stomach,  Which is  Called   Aura.                     TIPS  TO  REDUCE/ PREVENT  SEIZURES           1.  Take  Your  Anti seizure  Medications   As   Recommended.       2.  Get   Enough   Sleep.   Sleep  Deprivation   Can   Trigger  A  Seizure.       3.  If   You   Have  A fever,  Treat  It  At  Once,  And  Contact   Your  Primary  Care Providers.                                                                                                                           4. Avoid   Alcohol.       5. Avoid  Flashing  Lights,  Loud  Noises and  TV  And  Video  Games,             As   These  May  Trigger   Your  Seizures       6.  Control  Your  Stress  And   Have  Adequate  Rest.       7.   If  You  Feel  A  Seizure  Coming On :             A) warn people  Who  Are  With  You             B)  Make  Sure  There  Are  No  Sharp or  Hard  Objects  Around you.             C)  Lay down  On  Your  Side  And  Relax.                  *TO  MAINTAIN  REGULAR  SLEEP  WAKE  CYCLES.     *   TO  HAVE  ADEQUATE  REST  AND   SLEEP    HOURS.            *    AVOID  ANY USAGE OF    TOBACCO,          AVOID  EXCESSIVE  ALCOHOL  AND   ILLEGAL   SUBSTANCES          *  CONTINUE   MEDICATIONS    PRESCRIBED       AS    RECOMMENDED       *   Compliance   With  Medications   And  Instructions            *    MIGRAINE/ HEADACHE    DAIRY   WITH  MONITORING                       OF  DURATION  AND  FREQUENCY.        *  May   Use  Pill  Box,    If  Needed        *  MEDICATIONS TO AVOID:    WELLBUTRIN,  ULTRAM            *    Antiplatelet  therapy    As   Recommended  Was   Discussed      *    Prophylactic  Use   Of     Vitamin   B   Complex,  Folic  Acid,    Vitamin  B12    Multivitamin,       Calcium  With  magnesium  And  Vit D    Supplementations   Over  The  Counter  Discussed               *  PATIENT  IS  ALSO   COUNSELED   TO  KEEP    ACTIVITIES:         A)   SIMPLE      B)  ORGANIZED      C)  WRITEDOWN                     *  EVALUATIONS  AND  FOLLOW UP:                   * PHYSICAL  THERAPY                                 *CARDIOLOGY              *   OPTHALMOLOGY                  *     RECOMMENDATIONS  :  A)    FALL PRECAUTIONS                                 B)   SEIZURE  PRECAUTIONS                               C)    TO  CONTINUE   DEPAKOTE    500   MG    PO   TID                                            FOR   MIGRAINE    AND   SEIZURE    MANAGEMENT                               D)     ASA    81   MG  PO    DAILY                                  E)     IMITREX  ,   UBRELVY       AS   NEEDED                                             FOR   MIGRAINES  HEADACHES                               F)    DEPAKOTE  LEVEL    PERIODICALLY                              G)    FOLLOW  UP   NEURODIAGNOSTIC  EVALUATIONS                               H)    FOLLOW  UP    WITH    PCP,    SLEEP   CONSULTANTS                                           AND  MENTAL  HEALTH PROFESSIONALS                                                             --   REVIEWED     AND  DISCUSSED    WITH  PATIENT  Controlled Substances Monitoring: Periodic Controlled Substance Monitoring: Possible medication side effects, risk of tolerance/dependence & alternative treatments discussed., No signs of potential drug abuse or diversion identified. (Ailen Strauch, Renard Matter, MD)            Orders Placed This Encounter   Procedures    CT HEAD WO CONTRAST    CBC    Electrolyte Panel    AST    Valproic Acid Level, Total    TSH    Vitamin B12 & Folate    T. pallidum Ab    EEG    Vascular transcranial Doppler (TCD) complete    Vascular duplex carotid bilateral       Orders Placed This Encounter   Medications    divalproex (DEPAKOTE) 500 MG DR tablet     Sig: Take 1 tablet by mouth 3 times daily ONE  TABLET    3      TIMES  DAILY     Dispense:  90 tablet     Refill:  5    SUMAtriptan (IMITREX) 100 MG tablet     Sig: ONE   TABLET     TWICE  DAILY  AS  NEEDED   FOR  MIGRAINES     Dispense:  12 tablet     Refill:  2    Ubrogepant (UBRELVY) 100 MG TABS     Sig: Take 100 mg by mouth 2 times daily as needed (FOR  MIGRAINE)     Dispense:  30 tablet     Refill:  2     butalbital-acetaminophen-caffeine (ESGIC) 50-325-40 MG per tablet     Sig: ONE  TO  TWO  TABLETS  IN  THE  EVENING  AND  BED TIME  AS  NEEDED     Dispense:  60 tablet     Refill:  1             *PATIENT   TO  FOLLOW  UP  WITH   PRIMARY  CARE         OTHER  CONSULTANTS  AS  BEFORE.               *TO  FOLLOW  WITH   MENTAL  HEALTH  PROFESSIONALS ,  INCLUDING            PSYCHOLOGICAL  COUNSELING   AND  PSYCHIATRIC  EVALUATIONS                    --    REVIEWED     AND  DISCUSSED    WITH  PATIENT                         *  Maintain   Healthy  Life Style    With   Periodic  Monitoring  Of          Any  Medical  Conditions  Including   Elevated  Blood  Pressure,  Lipid  Profile,        Blood  Sugar levels  AndHeart  Disease.                *   Period   Screening  For  Cancers  Involving  Breast,  Colon,         Lungs  And  Other  Organs  As  Applicable,  In consultation   With  Your  Primary Care Providers.               *  Second  Neurological  Opinion  And  Evaluations  In  [facility]  Setting  If  Patient  Is  Interested.                 * Please   Contact   Neurology  Clinic   Early   If   Are  Any  New  Neurological   And  Any neurological  Concerns.                   *  If  The  Patient remains  Neurologically  Stable   Return   To  [facility] Neurology Department   IN      2 - 3        MONTHS  TIME   FOR  FURTHER              FOLLOW UP.                       *   The  Neurological   Findings,  Possible  Diagnosis,  Differential diagnoses                    And  Options  For    Further   Investigations                   And  management   Are  Discussed  Comprehensively.                    Medications   And  Prescription   Risks  And  Side effects  Are   Also  Discussed.                     *  If   There is  Any  Significant  Worsening   Of  Current  Symptoms  And  Or                  If patient  Develops   Any additional  New  NeurologicalSymptoms                  Or  Significant   Concerns   Should  Call  911 or  Go  To  Emergency  Department                  For  Further  Immediate  Evaluation and  management .                         The   Above  Were  Reviewed  With  PATIENT   and                         questions  Answered  In  Detail.                                                              TOTAL   TIME     SPENT :  On this date 10/31/2022 I have spent    40  minutes   reviewing previous notes, test results               and face to face time with the patient including   discussing the diagnosis and importance of compliance               with the treatment plan  as well as documenting on the day of the visit.                             Electronically signed by   Luiz Iron,  M.D., [place].     Board Certified in  Neurology &  In  Brain Injury Medicine   American Board of Psychiatry and Neurology (ABPN)      DISCLAIMER:   Although every effort was made to ensure the accuracy of this  electronictranscription, some errors in transcription may have occurred.      GENERAL PATIENT INSTRUCTIONS:     A Healthy Lifestyle: Care Instructions  Your Care Instructions  A healthy lifestyle can help you feel good, stay at ahealthy weight, and have plenty of energy for both work and play. A healthy lifestyle is something you can share with your whole family.  A healthy lifestyle also can lower your risk for serious health problems, such ashigh blood pressure, heart disease, and diabetes.  You can follow a few steps listed below to improve your health and the health of your family.  Follow-up careis a key part of your treatment and safety. Be sure to make and go to all appointments, and call your doctor if you are having problems. It's also a good idea to know your test results and keep a list of the medicines you take.  How can you care for yourself at home?  Do not eat too much sugar, fat, or fast foods. You can still have dessert and treats nowand then. The goal is  moderation.  Start small to improve your eating habits. Pay attention to portion sizes, drink less juice and soda pop, and eat more fruits and vegetables.  Eat a healthy amount of food. A 3-ounce serving of meat, for example, is about the size of a deck of cards. Fill the rest of your plate with vegetables and whole grains.  Limit theamount of soda and sports drinks you have every day. Drink more water when you are thirsty.  Eat at least 5 servings of fruits and vegetables every day. It may seem like a lot, but it is not hard to reach this goal. Aserving or helping is 1 piece of fruit, 1 cup of vegetables, or 2 cups of leafy, raw vegetables. Have an apple or some carrot sticks as an afternoon snack instead of a candy bar. Try to have fruits and/or vegetables at everymeal.  Make exercise part of your daily routine. You may want to start with simple activities, such as walking, bicycling, or slow swimming. Try tobe active 30 to 60 minutes every day. You do not need to do all 30 to 60 minutes all at once. For example, you can exercise 3 times a day for 10 or 20 minutes. Moderate exercise is safe for most people, but it is always agood idea to talk to your doctor before starting an exercise program.  Keep moving. Mow the lawn, work in the garden, or [employer]. Take the  stairs instead of the elevator at work.  If you smoke, quit. Peoplewho smoke have an increased risk for heart attack, stroke, cancer, and other lung illnesses. Quitting is hard, but there are ways to boost your chance of quitting tobacco for good.  Use nicotine gum, patches, or lozenges.  Ask your doctor about stop-smoking programs and medicines.  Keep trying.  In addition to reducing your risk of diseases in the future, you will notice some benefits soon after you stop using tobacco. If you have shortness of breath or asthma symptoms, they will likely getbetter within a few weeks after you quit.  Limit how much alcohol you drink. Moderate amounts  of alcohol (up to 2 drinks a day for men, 1drink a day for women) are okay. But drinking too much can lead to liver problems, high blood pressure, and other health problems.  health  If you have a family, there are many things you can do together to improve your health.  Eat meals together as a family as often as possible.  Eat healthy foods. This includes fruits, vegetables, lean meats and dairy, and whole grains.  Include your family in your fitness plan. Most peoplethink of activities such as jogging or tennis as the way to fitness, but there are many ways you and your family can be more active. Anything that makes you breathe hard and gets your heart pumping is exercise. Here are sometips:  Walk to do errands or to take your child to school or the bus.  Go for a family bike ride after dinner instead of watching TV.  Where can you learn more?  Go [email or web address] and sign in to your MyChart account. Enter [phone] in the Search HealthInformation box to learn more about "A Healthy Lifestyle: Care Instructions."     If you do not have anaccount, please click on the "Sign Up Now" link.  Current as of: June 20, 2015  Content Version: 11.2   2006-2017 [employer]. Care instructions adapted under license by [facility]. If you have questions about a medical condition or this instruction, always ask your healthcare professional. [employer] disclaims any warranty or liability for your use of this information.

## 2022-11-01 LAB — VITAMIN B12 & FOLATE
Folate: 9.2 ng/mL (ref >4.8)
Vitamin B-12: 314 pg/mL (ref 232–1245)

## 2022-11-01 LAB — T. PALLIDUM AB: T. pallidum, IgG: NONREACTIVE

## 2022-11-12 ENCOUNTER — Ambulatory Visit: Payer: PRIVATE HEALTH INSURANCE

## 2022-11-12 ENCOUNTER — Ambulatory Visit: Admit: 2022-11-02 | Payer: PRIVATE HEALTH INSURANCE | Primary: Family

## 2022-11-12 NOTE — Telephone Encounter (Signed)
PA initiated for [employer]

## 2022-11-19 ENCOUNTER — Inpatient Hospital Stay: Admit: 2022-11-19 | Payer: PRIVATE HEALTH INSURANCE | Attending: Neurology

## 2022-11-19 ENCOUNTER — Ambulatory Visit: Payer: PRIVATE HEALTH INSURANCE | Attending: Internal Medicine | Primary: Internal Medicine

## 2022-11-19 DIAGNOSIS — E236 Other disorders of pituitary gland: Secondary | ICD-10-CM

## 2022-11-19 NOTE — Progress Notes (Signed)
EXTENDED EEG Completed with Spike Analysis.    PCP: No primary care provider on file.    Ordering: Prasad N. Policherla, Neurologist    Interpreting Physician: Prasad N, Policherla, Neurologist    Technician: Anayla Giannetti L Daniqua Campoy, RN

## 2022-11-19 NOTE — Procedures (Unsigned)
[facility]                 [address] [place], [place] [address]                       ELECTROENCEPHALOGRAM (EEG) REPORT    PATIENT NAME: Larry Koch, Larry Koch                    DOB:        [birth year]  MED REC NO:   [id number]                             ROOM:  ACCOUNT NO:   [id number]                           ADMIT DATE: 11/19/2022  PROVIDER:     Gypsy Decant, MD    DATE OF STUDY:  11/19/2022    TECHNIQUE:  23 channels of EEG, 2 channels of EOG, 2 channel of EKG and  1 channel ground and 1 channel reference were recorded with [employer]  Software 32 Channel System utilizing the International 10/20 System  Protocol.    Spike detection and seizure analysis protocols were utilized and the  study was reviewed using the comprehensive EEG monitoring.    This is an extended EEG recorded for 1 hour and 2 minutes.    CLINICAL DATA:  The patient is 41 years old with a history of bipolar  disorder, seizure disorder, migraines, pituitary cyst, history of  stroke, memory problems, previous history of abnormal EEG, dizziness.    The purpose of the study is to evaluate for seizure activity.    MEDICATIONS:  Trazodone, Depakote, Imitrex, Seroquel, Effexor.    BACKGROUND ACTIVITY:  While the patient was awake, the background  activity consisted of fairly-regulated 9 Hz waveform seen over both  cerebral hemispheres.    Intermittent eye movement artifacts noted.    ACTIVATION PROCEDURES:  HYPERVENTILATION:  Hyperventilation was performed for 3 minutes. Patient  was cooperative with good effort.  Also, post-hyperventilation period  was monitored closely.  Hyperventilation:  Intermittent sharp waves and  slow sharp waves noted over the post-hyperventilation periods.    PHOTIC STIMULATION:  Photic stimulation was performed at the following  frequencies: 1 Hz, 3 Hz, 6 Hz, 9 Hz, 12 Hz, 15 Hz, 18 Hz, 21 Hz, 25 Hz,  30 Hz and patient tolerated well.  Photic stimulation:  Unremarkable.    SLEEP:   Stage I and II.    EKG:  EKG showed normal sinus rhythm without any significant  abnormality.    IMPRESSION:  Intermittent sharp waves, slow sharp waves noted during  post-hyperventilation period suggestive of underlying potentially  epileptogenic abnormality.    Further clinical correlation and followup recommended.          Gypsy Decant, MD          D: 12/03/2022 14:32:23       T: 12/03/2022 15:20:48     [email or web address]  Job#: [id number]     Doc#: [id number]    CC:

## 2022-12-06 ENCOUNTER — Ambulatory Visit: Payer: PRIVATE HEALTH INSURANCE

## 2022-12-10 NOTE — Progress Notes (Signed)
Formatting of this note is different from the original.      Chief Complaint   Patient presents with    Sleeping Problem   New patient in the clinic,  Obstructive sleep apnea, -    had previous  polysomnography and    [facility] and [facility]   [facility],   Continued to have compliance issues  And difficulty getting used to..  Wants to consider   [employer]pire DEVICE -  implant device    **  For the patients  reviewing this note- FYI : All or Portions of this document were generated using voice recognition software.There may be unintended errors / inaccuracies  yntax errors in transcription which were not immediately recognized at the time of document creation.  Please contact  Author    if there appears to be any  inaccuracies in transcription or  questions about its content.  It will be highly appreciated    Referred by his PCP.    Larry Koch is a 42 y.o. male     Retrired person  He wants to consider        hypoglossal stimulator      procedure for his sleep apnea    Rather than continueTrying      CPAP.      He goes to bed    late at night,       he has been napping during the daytime,  Wake Timings are    not fixed.        Recently done polysomnographic,         fax messages   not very clear    However it shows AHI      Less than 5,           Previously known to have,     sleep apnea ,         Last titration, 3 years ago AT  [facility] c January 25, 2020, he was recommended CPAP 7 cm   AHI was 0 at this settings, no hypoxia.  Later no follow-up  Patient has  h/o     sometimes, Snoring,  past,  witnessed apnea and struggles to breathe while snoring,     no  history of choking episodes during sleep.  No sleep initiation  problems   most of the time,   no significant symptoms of  RLS   Known   excessive motor activity during sleep known.  *    Denies significant nightmares, no sleep violence, sleepwalking ; sleep paralysis,  No sleep onset or waking up hallucination.  No symptoms of cataplexy.   Has   Nocturia   X  and;   no enuresis.  No significant endocrine   Symptoms    Denies recent changes in vision  Aquity  Significant respiratory symptoms-   No history of pneumonia in the past .  No    COPD   RAD      Does not feel rested in the morning after awakening, daytime feels sleepy and exhausted.*         ESS      Total score: 16 (12/10/22 0907)             Neck Circumference Inches: 17.5 inches (17.5" neck)     No significant morning headaches after waking up..  No  significant cognitive symptoms during the day.  Usual local driving around local towns does not cause drowsiness.    Denies  roadside or work-related accidents.     He has had   History of GERD symptoms at night which could disturb  the sleep.  He has been treated,  sometimes  significant congestion at night  ,  No   palpitation.  Denies significant musculoskeletal problems causing sleep disturbance.  Denies significant neuropsychiatric problems.   Past and treated for anxiety and mood problems,    Lives  with the spouse    and family,     No significant use of alcoholic drinks,   Does  not use caffeinated drinks to stay alert.  Uses      2 caffeinated drinks during the day.   SMOKING:   does not smoke             does not use significant non-prescription drugs.    No  history of     head injury,    concussion, seizures,.    H/O Airway surgery-       No   Family history for sleep disorders not significant.    Allergies   Allergen Reactions    Blueberry Anaphylaxis    Penicillins Other (See Comments)    Sulfamethoxazole-Trimethoprim Nausea Only      ** The following portions of the patient's history were reviewed and updated as appropriate: allergies, current medications, past family history, past medical history, past social history, past surgical history and problem list.    Patient Active Problem List   Diagnosis    Contusion of left forearm    Contusion of left leg    Strain of left shoulder    Chest wall contusion, right, initial encounter    RLQ abdominal pain      Current Outpatient Medications:     cholecalciferol, vitamin D3, 75 mcg (3,000 unit) Tab, Take 1 tablet (3,000 Units total) by mouth daily., Disp: , Rfl:     cloNIDine HCL (CATAPRES) 0.2 MG tablet, Take 1 tablet (0.2 mg total) by mouth 2 (two) times daily., Disp: , Rfl:     divalproex (DEPAKOTE) 500 MG delayed-release tablet, Take 1 tablet (500 mg total) by mouth 3 (three) times daily., Disp: , Rfl:     hydrOXYzine (VISTARIL) 50 MG capsule, Take by mouth., Disp: , Rfl:     omeprazole (PriLOSEC) 20 MG capsule, Take 1 capsule (20 mg total) by mouth., Disp: , Rfl:     colestipoL (COLESTID) 1 gram tablet, Take 1 tablet (1 g total) by mouth 2 (two) times daily. Take one hour after other medications and at least four hours prior to medications. Monitor for constipation., Disp: 60 tablet, Rfl: 1    famotidine (PEPCID) 20 MG tablet, Take 2 tablets (40 mg total) by mouth at bedtime., Disp: 60 tablet, Rfl: 1    EXAMNATION:     BP 118/88   Pulse 70   Ht 1.829 m (6')   Wt (!) 235 lb (106.6 kg)   SpO2 95%   BMI 31.87 kg/m           body mass index is 31.87 kg/m.Marland Kitchen    Normally,   Alert, oriented   ,  normal personality  Good  reaction time ,  moderately, high BMI  No edema, Not anemic Breathing comfortably   Thought content normal     Behavior normal.  Mood and Affect: normal.      Good memory and judgement for age, gait and speech is normal,   cranial nerves grossly are symmetrical    No ataxia.  Muscle tone power -  reflexes symmetrical.  Patient has  excessive  central body  adiposity, No epigastric tenderness.   Facial features  are normal  ,     nostrils     -normal  Nose   Some hypertrophy of nasal termbinates,   No significant nasal septal deviation.    No significant nasal collapse   With the deep inspiration .  TONSILS-  not large    Oropharyngeal airway class   IV      soft palate is long,  no post nasal discharge; macroglossia present , ,  Eyes-   Normal -          no thyroid or neck swelling   Allergies  shiners not significant,  Lymphatic system-  Normal , no significant lymph node swellings around the neck.  Lungs: breath sounds are equal and symmetrical,   no adventitious sounds.  CVS: Rhythm is normal, normal first and 2nd heart  sounds.    @CURRENTLABS @    Obstructive sleep apnea    Other insomnia    Inadequate sleep hygiene    Sleep-disordered breathing    Encounter Diagnoses   Name Primary?    Obstructive sleep apnea Yes    Other insomnia     Inadequate sleep hygiene     Sleep-disordered breathing          PLAN AND ADVICE:     Based on  , recent polysomnographic report    and review of the data  conducted   recently 2- weeks ago  Further treatment of OSA  ,   is not recommended    , It is below the threshold for any modality of treatment,    by any of the insurance plans.      However continue to follow the recommendations      so that it does not get worse in future.  .   Please continue to follow -suggestions regarding sleep hygiene, sleep promoting tips, maintaining 8+ hours of bedtime and keeping wake-up time constant all 7 days.    As discussed earlier limit your daytime napping no more than 30 minutes set up a time or to wake up and when you do nap make sure do not think or forced herself to fall asleep  close her eyes think of good moments of life or some type of medication  when the timer goes off in 30 minutes  get up and keep going    In the morning wake up at usual time     around the [place] and exposed to bright light for 40 minutes every day  half a morning walk   it has to be done as soon as possible    At nighttime     to try to go to bed gradually earlier and earlier so that you are in the bed by 10 PM or early   and half an hour before that prepared for your bedtime.      While relaxing go to the bed without sitting on anymore light or doing anything in the bathroom.    When you go to the bed do not think of falling asleep  Again think of relaxing     resting and good moments of life        This will improve sleep quality,     it is likely to help memory disturbances    including migraines as well.  Avoid sleep deprivation,  Good control of upper respiratory congestion  ,      avoid  mouth breathing while sleeping.     Opening mouth to breathe, narrows the airway and throat during sleep    Setup wake-up alarm, do not look at the clock after lights out till alarm goes off.  To improve sleep onset-    not deliberately try to fall asleep, as it prolongs sleep onset time.  If you cannot fall asleep  please do not think of sleeping rather relax in your bed, not look around, do not calculate time/or look at the clock,    If  you have   significant physical problems  especially causing discomfort or pain/  environmental problems making you uncomfortable.  Please address those issues for better sleep continuity.     Please be aware of- Risks associated with excessive daytime Sleepiness/Drowsiness discussed  esp . Sleepy driving . Don't drive if drowsy.        Excessive weight, high BMI is 1 of the major risk factors for sleep apnea and nighttime low oxygen levels                       Obesity:  The patient's Body mass index is 31.87 kg/m. The association between obesity and sleep apnea was discussed.  The patient was advised to lose weight with diet and lifestyle modification as tolerated.       If you had been smoking,, please be informed smoking does cause sleep onset difficulties as nicotine and stimulant and  smoking also makes people more prone to sleep apnea snoring due to inflammation caused in the upper airways.    List of appointments:             Total time in minutes for the  visit:   50* includes review of PCP and other providers notes,  laboratory results, past history, various topics  of current history, problem review , exam assessment and plan, required testing,  prognosis counseling,  care coordination  and communication with other  providers.    THIS NOTE WAS CREATED USING  VOICE-RECOGNITION SOFTWARE, AND MAY CONTAIN INACCURACIES OF TRANSCRIPTION WHICH MIGHT HAVE BEEN  INADVERTENTLY OVERLOOKED PRIOR TO SIGNATURE.      FOR ANY QUESTIONS, PLEASE CONTACT THE  Loss adjuster, chartered signed by Kalman Jewels, MD at 12/23/2022 12:37 AM EST

## 2022-12-23 ENCOUNTER — Inpatient Hospital Stay: Admit: 2022-12-23 | Payer: PRIVATE HEALTH INSURANCE | Attending: Neurology

## 2022-12-23 DIAGNOSIS — E236 Other disorders of pituitary gland: Secondary | ICD-10-CM

## 2022-12-23 NOTE — Progress Notes (Signed)
TCD Completed with Emboli Detection.    PCP: Trisha Mangle, APRN - NP    Ordering: Milus Glazier. Policherla, Neurologist    Interpreting Physician: Milus Glazier. Policherla, Neurologist    Electronically signed by Andria Meuse, RN on 12/23/2022 at 10:44 AM

## 2022-12-23 NOTE — Procedures (Signed)
[facility]                 [place], [place] [address]                             Transcranial Doppler (TCD)        PATIENT NAME: Larry Koch, Larry Koch                    DOB:        [birth year]  MED REC NO:   [id number]                             ROOM:  ACCOUNT NO:   [id number]                           ADMIT DATE: 12/23/2022    PROVIDER:     Madelynn Done, MD      DATE OF STUDY:  12/23/2022    TECHNIQUE:  Transcranial Doppler study of intracranial arteries was  performed using [employer] equipment with digital Doppler technology, with  high resolution 250 Gate M-mode display and Multigate Spectral Windows  and 2 MHz Doppler probes via temporal, suboccipital and orbital  approaches.    Transcranial Doppler study of the intracranial arteries was also  performed for emboli detection without intravenous microbubble injection  using continuous soundtrack, M-Mode with Multigate Spectral displays and  soundtrack displays with continuous bilateral monitoring.      CLINICAL DATA:  The patient is 42 years old with a history of stroke,  chronic headaches, migraine, memory problem, dizziness, cerebral  ischemia, right middle cerebral artery stenosis.    The purpose of the study is to evaluate for stroke, intracranial focal  stenosis, flow abnormalities, vertebrobasilar insufficiency evaluations.    SUMMARY:  Mean flow velocities in the right and left anterior, middle,  and posterior cerebral arteries appear fairly within normal limits with  normal PI values.    Mean flow velocities in the right and left vertebral arteries, basilar  artery are low with normal PI values.    TCD OF INTRACRANIAL ARTERIES FOR EMBOLI DETECTION:  Review and analysis of the waveforms and continuous soundtrack systems  during the current monitoring show no evidence of HITS (high intensity  transient signals) and no embolic shower events were detected.        IMPRESSION:      1.  No significant focal  stenosis or flow abnormalities noted in the         anterior circulation.      2.  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      3.  TCD of intracranial arteries for emboli detection is negative.          Further clinical correlation and followup recommended.            Madelynn Done, MD, [place]     Board Certified in Neurology  & in  [place] of Psychiatry and Neurology (ABPN).         D: 12/24/2022 9:56:14       T: 12/24/2022 10:28:58     [id number]  Job#: [id number]     Doc#: [id number]    CC:

## 2023-01-15 IMAGING — CT CT RENAL STONE PROTOCOL
2 of 4 series · 16 of 46 positions shown, 18 images · non-contrast
Comparison: None.

CLINICAL DATA: Flank pain.  Kidney stone suspected.



[Series 3: ap without · axial · non-contrast · 0.88mm/px · z∈[-555,-65]mm · 13 of 112 slices shown, 15 images]
[im 7/112  soft-tissue]
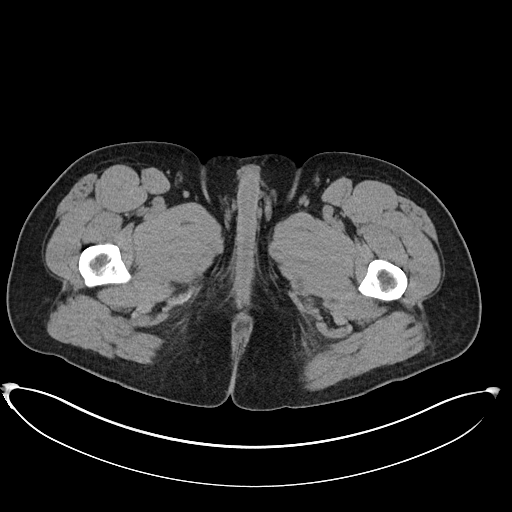
[im 7/112  bone]
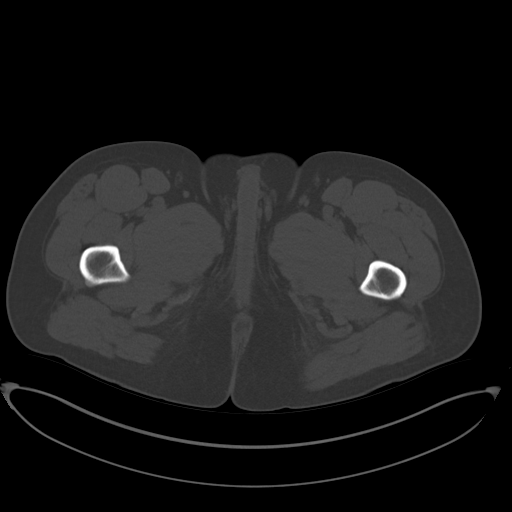
[im 14/112  soft-tissue]
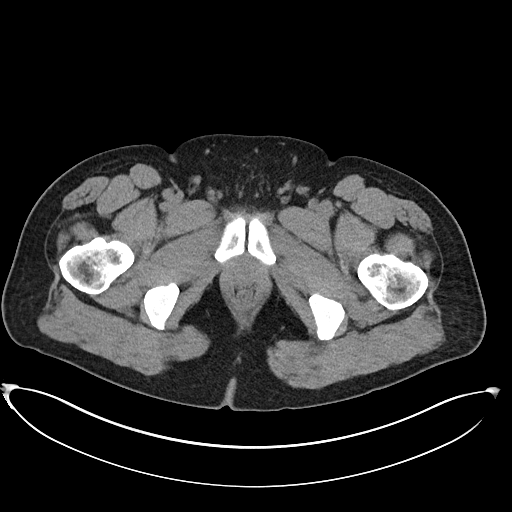
[im 27/112  soft-tissue]
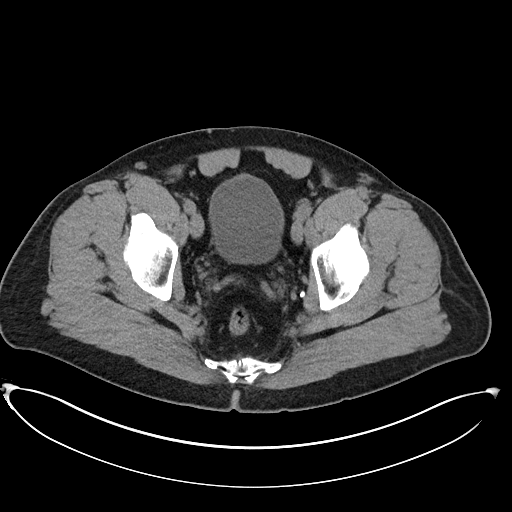
[im 33/112  soft-tissue]
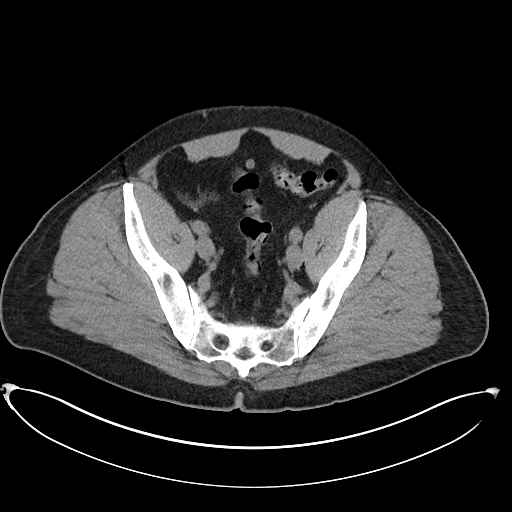
[im 40/112  soft-tissue]
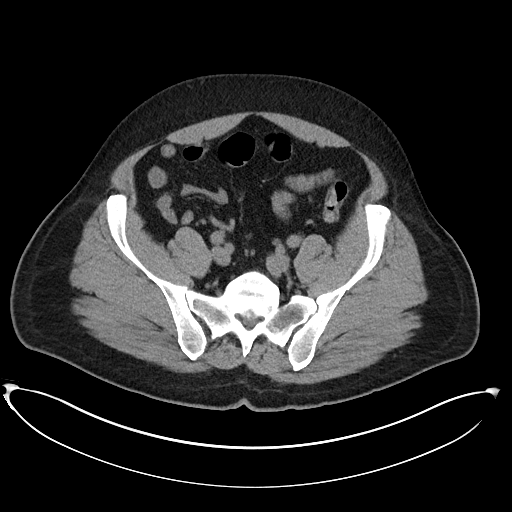
[im 46/112  soft-tissue]
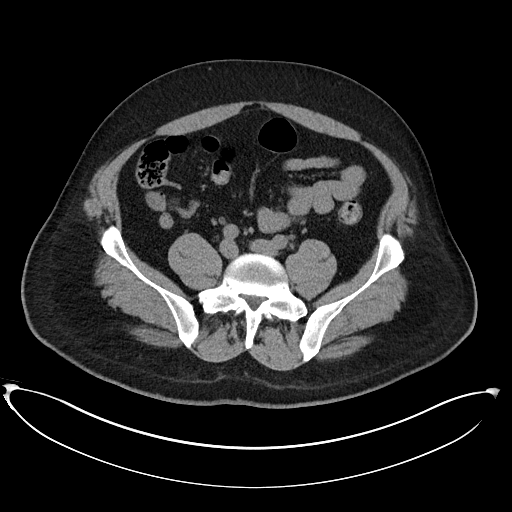
[im 59/112  soft-tissue]
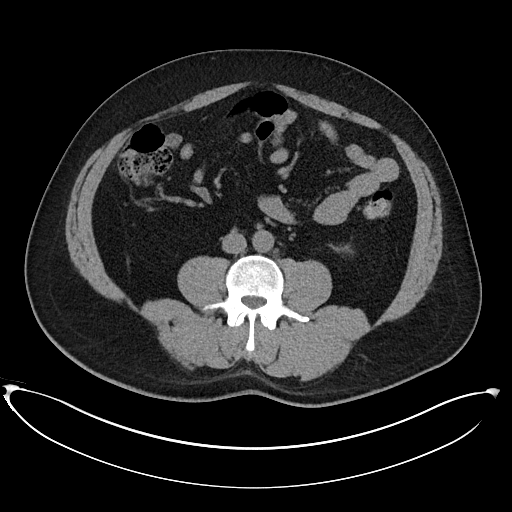
[im 66/112  soft-tissue]
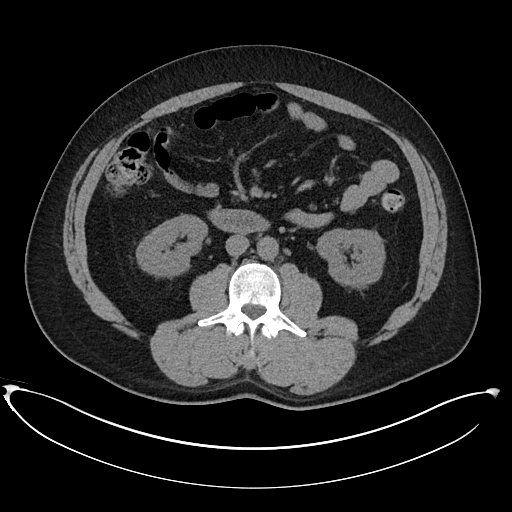
[im 72/112  soft-tissue]
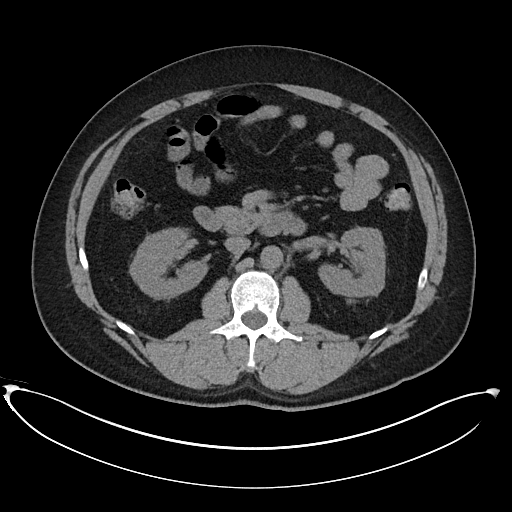
[im 72/112  bone]
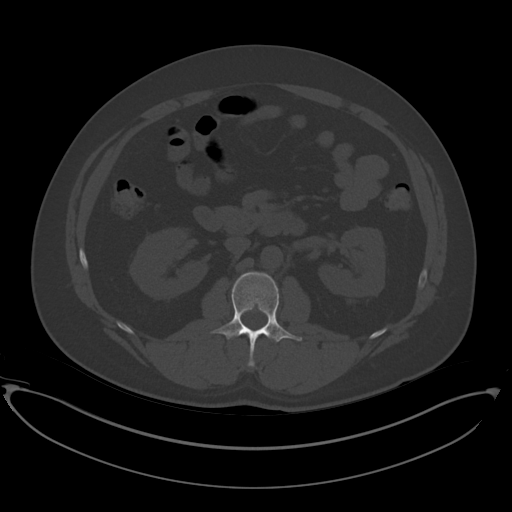
[im 79/112  soft-tissue]
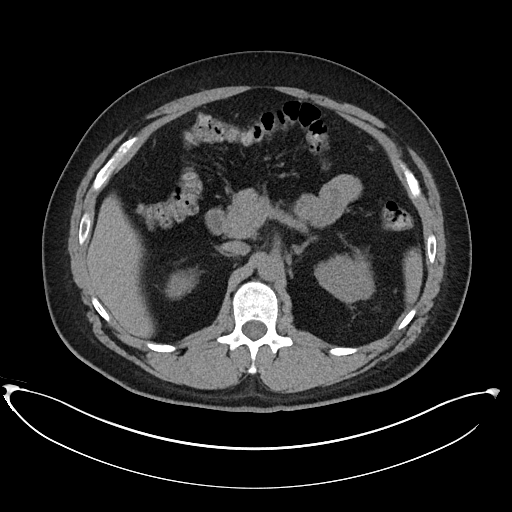
[im 85/112  soft-tissue]
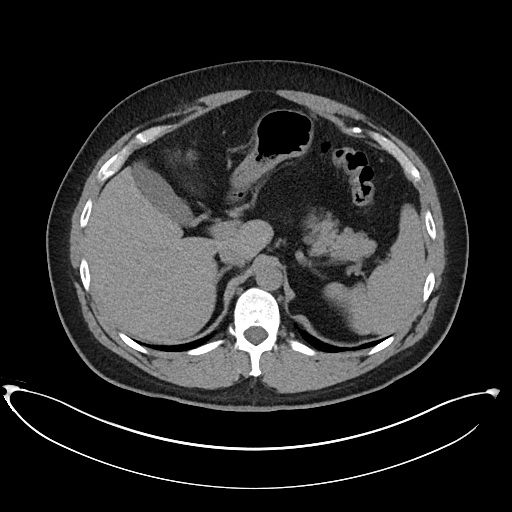
[im 98/112  soft-tissue]
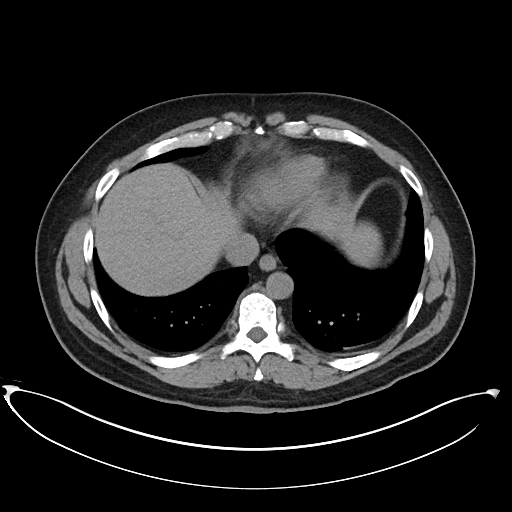
[im 105/112  soft-tissue]
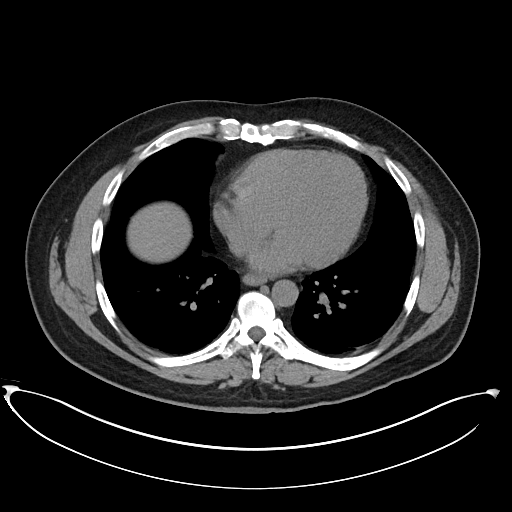

[Series 6: cor · coronal · 0.79mm/px · 3 of 110 slices shown]
[im 37/110  soft-tissue]
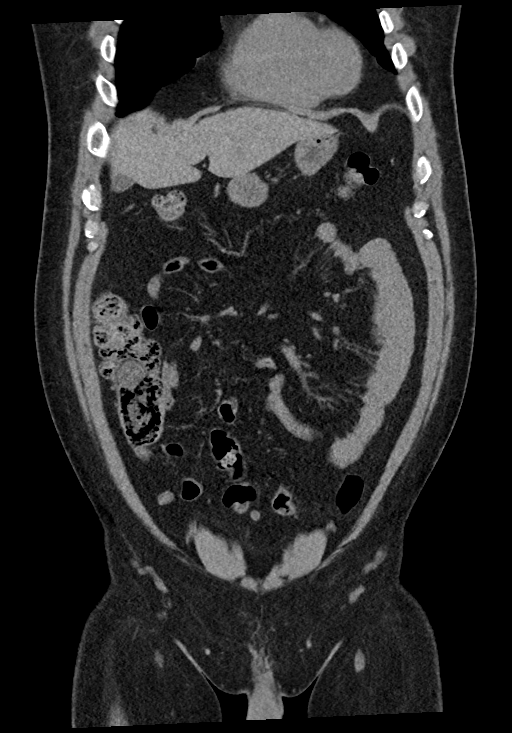
[im 49/110  soft-tissue]
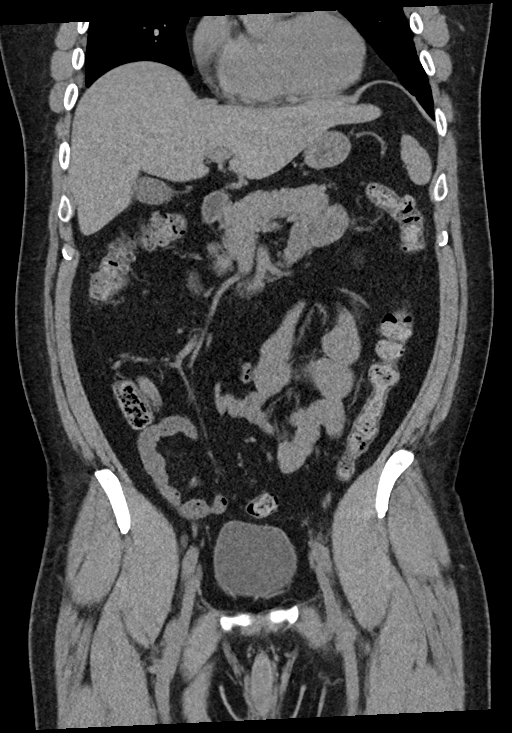
[im 61/110  soft-tissue]
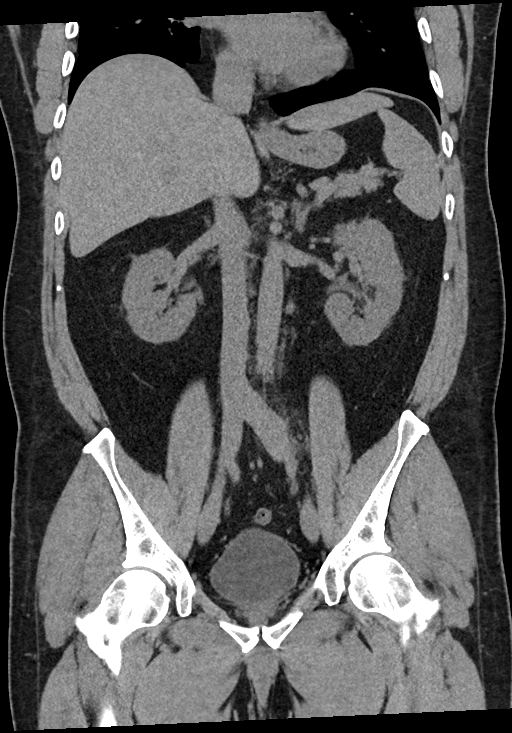

[16 of 46 positions shown; findings below may reference images not displayed]

FINDINGS: Lower chest: No acute abnormality.

Hepatobiliary: No focal liver abnormality is seen. No gallstones,
gallbladder wall thickening, or biliary dilatation.

Pancreas: Unremarkable. No pancreatic ductal dilatation or
surrounding inflammatory changes.

Spleen: Normal in size without focal abnormality.

Adrenals/Urinary Tract: Normal adrenal glands. No kidney stones,
hydronephrosis or mass identified bilaterally. No ureteral calculi
identified. Urinary bladder is unremarkable.

Stomach/Bowel: Stomach is within normal limits. Appendix appears
normal. No evidence of bowel wall thickening, distention, or
inflammatory changes.

Vascular/Lymphatic: Normal appearance of the abdominal aorta. No
aneurysm. No signs of abdominopelvic adenopathy.

Reproductive: Prostate is unremarkable.

Other: Small fat containing umbilical hernia, image 60/3. No
abdominopelvic ascites.

Musculoskeletal: No acute or significant osseous findings.
IMPRESSION: 1. No acute findings within the abdomen or pelvis. No
nephrolithiasis or signs of hydronephrosis.
2. Small fat containing umbilical hernia.

## 2023-01-27 NOTE — Telephone Encounter (Signed)
Patient called back in regards to his appointment with Dr Cheron Schaumann that was cancelled on 02/04/2023.    Patient states he saw Dr Donnal Moat one time and had testing but the provider never got back with him about results.    [facility] obtain information from Dr Donnal Moat but were unsuccessful.    Patient states he does not wish to go see Dr Donnal Moat again.   Patient states he was told by the [facility] that the General Surgeon should be able to consult him for his IBS issues.    Please advise if patient can be scheduled again with Dr Cheron Schaumann.

## 2023-01-29 NOTE — Telephone Encounter (Signed)
Spoke with Patient this morning, he was wanting a referral to a GI specialist to follow long term with his IBS and GI issues.I explained to patient that we are general surgery and not a GI specialist and we don't follow people with chronic GI issues. He said that he would call the [place] and have them send a referral to GI.

## 2023-02-03 ENCOUNTER — Encounter: Payer: PRIVATE HEALTH INSURANCE | Attending: Neurology

## 2023-02-04 ENCOUNTER — Ambulatory Visit: Payer: PRIVATE HEALTH INSURANCE | Attending: Surgery

## 2023-02-11 NOTE — Telephone Encounter (Signed)
Patient stopped at the desk yesterday asking for a referral to a GI provider to manage his IBS and GI issues.  Patient does not want to return to Dr. Donnal Moat as he states he could not get his test results. Dr. Cheron Schaumann was not in clinic at the time.  Call placed to [facility] to confirm they treat IBS and take [employer] and they said yes.      Today, discussed with Dr. Cheron Schaumann and he said that would be fine for patient to go to [facility] or wherever the [place] would allow him to go for his continued care.  Dr. Cheron Schaumann would like PCP to be the gatekeeper for referral so that authorizations can be attained.    Call placed to patient today and he is aware of the plan and is in agreement to get authorization process started with PCP.  Phone number for [facility] given to the patient to follow through.

## 2023-02-12 ENCOUNTER — Encounter: Payer: PRIVATE HEALTH INSURANCE | Attending: Neurology

## 2023-02-27 ENCOUNTER — Emergency Department: Admit: 2023-02-28 | Payer: PRIVATE HEALTH INSURANCE

## 2023-02-27 ENCOUNTER — Ambulatory Visit: Payer: PRIVATE HEALTH INSURANCE | Attending: Neurology

## 2023-02-27 DIAGNOSIS — R072 Precordial pain: Secondary | ICD-10-CM

## 2023-02-27 DIAGNOSIS — R079 Chest pain, unspecified: Secondary | ICD-10-CM

## 2023-02-27 NOTE — Discharge Instructions (Signed)
PLEASE RETURN TO THE EMERGENCY DEPARTMENT IMMEDIATELY if your symptoms worsen in anyway or in 8-12 hours if not improved for re-evaluation.  You should immediately return to the ER for symptoms such as increasing pain, shortness of breath, fever, a feeling of passing out, light headed, dizziness, abdominal pain, numbness or weakness to the arms or legs, coolness or color change of the arms or legs.      Take your medication as indicated and prescribed.  If you are given an antibiotic then, make sure you get the prescription filled and take the antibiotics until finished.      Please understand that at this time there is no evidence for a more serious underlying process, but that early in the process of an illness or injury, an emergency department workup can be falsely reassuring.  You should contact your family doctor within the next 24 hours for a follow up appointment    THANK YOU!!!    From [facility] and [facility]    On behalf of the Emergency Department staff at [facility], I would like to thank you for giving us the opportunity to address your health care needs and concerns.    We hope that during your visit, our service was delivered in a professional and caring manner. Please keep [facility] in mind as we walk with you down the path to your own personal wellness.     Please expect an automated text message or email from us so we can ask a few questions about your health and progress. Based on your answers, a clinician may call you back to offer help and instructions.    Please understand that early in the process of an illness or injury, an emergency department workup can be falsely reassuring.  If you notice any worsening, changing or persistent symptoms please call your family doctor or return to the ER immediately.     Tell us how we did during your visit at [email or web address]   and let us know about your experience

## 2023-02-27 NOTE — ED Provider Notes (Signed)
[facility]      Pt Name: Larry HareCharles Koch  MRN: [id number]  Birthdate 1981-09-10  Date of evaluation: 02/27/2023      CHIEF COMPLAINT       Chief Complaint   Patient presents with    Chest Pain     Pt c/o substernal CP while at rest about noon today, has hx of bad back "I thought it waS just that" states now pain radiates into neck on the lt side of neck         HISTORY OF PRESENT ILLNESS    Larry HareCharles Brill is a 42 y.o. male who presents to the emergency department brought in via EMS for evaluation of intermittent substernal chest pain since around noon on the day of evaluation on February 27, 2023.  Patient admits to associated shortness of breath, dizziness and palpitations.  He denies any swelling in the legs, cough or fever and denies any diaphoresis.  Patient has history of prior stroke with left sided hemiparesis and seizure disorder as well as history of posttraumatic stress disorder and peptic ulcer disease.  No history of hypertension, diabetes mellitus or hyperlipidemia.  No history of known coronary artery disease.  Patient smokes cigars and there is family history of coronary artery disease.  Patient denies any recent long travels or prolonged immobilization and no history of DVT or PE in the past.  Patient states that he has history of bad back and thought it was just back pain but now the pain is radiating into the chest and thus he called EMS to come to the emergency department.  Pain is worse with certain movements and deep breaths.  He denies any swelling in the legs or calf pain.      REVIEW OF SYSTEMS       Review of Systems   Constitutional:  Negative for diaphoresis, fatigue and fever.   HENT:  Negative for congestion and sore throat.    Respiratory:  Positive for shortness of breath. Negative for cough.    Cardiovascular:  Positive for chest pain. Negative for palpitations and leg swelling.   Gastrointestinal:  Negative for abdominal pain and nausea.    Neurological:  Positive for dizziness. Negative for syncope and headaches.   All other systems reviewed and are negative.       PAST MEDICAL HISTORY    has a past medical history of Anxiety, Cerebral artery occlusion with cerebral infarction (HCC), GERD (gastroesophageal reflux disease), Glaucoma, Personality disorder (HCC), PTSD (post-traumatic stress disorder), PUD (peptic ulcer disease), and Seizures (HCC).    SURGICAL HISTORY      has a past surgical history that includes Tonsillectomy.    CURRENT MEDICATIONS       Discharge Medication List as of 02/27/2023 11:35 PM        CONTINUE these medications which have NOT CHANGED    Details   cloNIDine (CATAPRES) 0.1 MG tablet TAKE ONE TABLET BY MOUTH AT BEDTIME FOR BLOOD PRESSUREHistorical Med      QUEtiapine (SEROQUEL) 25 MG tablet Take 8 tablets by mouth daily Taking 1/2 tablet daily.Historical Med      venlafaxine (EFFEXOR XR) 37.5 MG extended release capsule TAKING 225MG  DAILYHistorical Med      Multiple Vitamins-Minerals (MULTIVITAMINS/MINERALS ADULT PO) TAKE 1 TABLET BY MOUTH ONCE DAILY FOR SUPPLEMENTATIONHistorical Med      omeprazole (PRILOSEC) 20 MG delayed release capsule Take 20 mg by mouth 2 times dailyHistorical Med      psyllium (  KONSYL) 28.3 % PACK Take 1 packet by mouth dailyHistorical Med      lamoTRIgine (LAMICTAL) 100 MG tablet Take 1 tablet by mouth dailyHistorical Med      valproic acid (DEPAKENE) 250 MG capsule Take 1 capsule by mouth 4 times dailyHistorical Med      azithromycin (ZITHROMAX) 250 MG tablet Take 1 tablet by mouth dailyHistorical Med      pantoprazole (PROTONIX) 40 MG tablet 1 tabletHistorical Med      vitamin D (CHOLECALCIFEROL) 25 MCG (1000 UT) TABS tablet 50 mcgHistorical Med      prazosin (MINIPRESS) 2 MG capsule 1 capsuleHistorical Med      dicyclomine (BENTYL) 10 MG capsule Take 2 capsules by mouth 4 times daily (before meals and nightly)Historical Med      colestipol (COLESTID) 1 g tablet Take 1 tablet by mouth 2 times  dailyHistorical Med      traZODone (DESYREL) 100 MG tablet 1 tabletHistorical Med      famotidine (PEPCID) 20 MG tablet TAKE TWO TABLETS BY MOUTH AT BEDTIMEHistorical Med      divalproex (DEPAKOTE) 500 MG DR tablet Take 1 tablet by mouth 3 times daily ONE  TABLET    3      TIMES  DAILY, Disp-90 tablet, R-5Normal      SUMAtriptan (IMITREX) 100 MG tablet ONE   TABLET     TWICE  DAILY  AS  NEEDED   FOR  MIGRAINES, Disp-12 tablet, R-2Normal      Ubrogepant (UBRELVY) 100 MG TABS Take 100 mg by mouth 2 times daily as needed (FOR  MIGRAINE), Disp-30 tablet, R-2Normal      butalbital-acetaminophen-caffeine (ESGIC) 50-325-40 MG per tablet ONE  TO  TWO  TABLETS  IN  THE  EVENING  AND  BED TIME  AS  NEEDED, Disp-60 tablet, R-1Normal             ALLERGIES     is allergic to blueberry, other, penicillins, sulfamethoxazole-trimethoprim, and food.    FAMILY HISTORY     He indicated that the status of his mother is unknown. He indicated that the status of his father is unknown.     family history includes Diabetes in his father and mother; Heart Disease in his father; Lung Cancer in his father; Mental Illness in his mother.    SOCIAL HISTORY      reports that he has never smoked. He has never used smokeless tobacco. He reports that he does not currently use alcohol. He reports that he does not use drugs.    SCREENINGS    Glasgow Coma Scale  Eye Opening: Spontaneous  Best Verbal Response: Oriented  Best Motor Response: Obeys commands  Glasgow Coma Scale Score: 15      PHYSICAL EXAM     INITIAL VITALS:  height is 1.829 m (6') and weight is 105.7 kg (233 lb). His tympanic temperature is 97.3 F (36.3 C). His blood pressure is 123/79 and his pulse is 54. His respiration is 18 and oxygen saturation is 95%.      Physical Exam  Vitals and nursing note reviewed.   Constitutional:       General: He is not in acute distress.     Appearance: He is well-developed.   HENT:      Head: Normocephalic and atraumatic.      Nose: Nose normal.    Eyes:      Extraocular Movements: Extraocular movements intact.      Pupils: Pupils are equal,  round, and reactive to light.   Cardiovascular:      Rate and Rhythm: Normal rate and regular rhythm.      Heart sounds: Normal heart sounds. No murmur heard.  Pulmonary:      Effort: Pulmonary effort is normal. No respiratory distress.      Breath sounds: Normal breath sounds.   Abdominal:      General: Bowel sounds are normal. There is no distension.      Palpations: Abdomen is soft.      Tenderness: There is no abdominal tenderness.   Musculoskeletal:      Cervical back: Normal range of motion and neck supple.   Skin:     General: Skin is warm and dry.   Neurological:      General: No focal deficit present.      Mental Status: He is alert and oriented to person, place, and time.   Psychiatric:         Behavior: Behavior normal.         Thought Content: Thought content normal.          DIFFERENTIAL DIAGNOSIS/ MDM:     Differential diagnosis considered: Bronchitis, Pneumonia, ACS, PE, Musculoskeletal pain, pneumothorax, thoracic aortic dissection, nonspecific chest pain, gastrointestinal in origin     HEART SCORE    Variable       Score   History  [] Highly Suspicious      2     [] Moderately Suspicious     1     [x] Slightly Suspicious      0     ECG   [] Significant ST-depression     2     [] Nonspecific Repolarization     1     [x] Normal       0     Age   [] >81 years old      2    [] 61-69 years old      1     [x] <86 years old      0     Risk Factors  [] >3 risk factors or history of atherosclerotic disease 2     [x] 1 or 2 risk factors      1     [] No risk factors      0     Troponin  [] >3x normal limit      2     [] 1-3x normal limit      1     [x] < normal limit      0   Risk Factors: DM, current or recent (<one month) smoker, HTN, hyperlipidemia, family history of CAD or obesity     Total Score = 1    Score 0-3: 2.5% Major Acute Coronary Event over the next 6 weeks -> D/C home  Score 4-6: 20.3% MACE -> Admit for  Observation  Score 7-10: 72.7% MACE ->Early Invasive Strategies  "Chest Pain in the Emergency Room: A Multicenter Validation of the HEART Score". Backus BE, Six AJ, Kelder JC, Mast TP, [employer], Mast EG, Monnink SH, [employer] RM, [place]. Crit Pathw Cardiol. 2010 Sep; 9(3): 164-169.      PERC Criteria     n = Age      > or = 50  n = Heart Rate     > or = 100  n = Pulse Oximetry     < or = 95%  n = Previous History of DVT   Yes / No  n = Trauma or Surgery within 4 Weeks Yes / No  n = Hemoptysis    Yes / No  n = Exogenous Estrogen use  Yes / No  n = Unilateral Leg Swelling   Yes / No    Risk Criteria for Thoracic Aortic Dissection:     n = Sudden Onset, Maximal at Onset, with radiation to back  n = Neurologic Deficits with Chest Pain  n = Connective Tissue Disorder such as Marfan's or Ehlers-Danlos  n = History of Aortic Valvular Disease  n = Pregnancy  n = Family History of Dissection  Elevated D-Dimer with any of the above     Chronic Conditions affecting care (DM,HTN,CA, etc): See past medical history above.    Social Determinants of Health affecting care (unable to care for self, lives alone, unemployed, homeless,etc): Patient lives at home and is able to take care of himself    History source(s) (patient,spouse,parent,family,friend,EMS,etc): Patient and EMS    Review of external sources (ECF,Hospital records,EMS report, radiology reports, etc): Hospital records    Tests considered but not ordered: See below    Independent interpretation of tests (eg.  X-ray, CAT scan, Doppler studies, EKG): See below    Discussion of x-ray results with radiology: See below    Consults: See below    Consideration for admission/observation (even if discharged): Considered admission, final decision will be based on test results and patient's status.    Prescription considerations: See below    Sepsis considered: No evidence of bacteremia or sepsis at this time    Critical Care note written: See below    DIAGNOSTIC  RESULTS     EKG: All EKG's are interpreted by the Emergency Department Physician who either signs or Co-signs this chart in the absence of a cardiologist.    EKG Interpretation    Interpreted by emergency department physician    Rhythm: Sinus rhythm, but Bradycardic  Rate: Bradycardic  Axis: normal  Conduction: normal  ST Segments: no acute change  T Waves: no acute change  Q Waves: no acute change    Clinical Impression: Sinus Bradycardia.         RADIOLOGY:   Interpretation per the Radiologist below, if available at the time of this note:    XR CHEST PORTABLE    Result Date: 02/27/2023  EXAMINATION: ONE XRAY VIEW OF THE CHEST 02/27/2023 7:42 pm COMPARISON: None. HISTORY: ORDERING SYSTEM PROVIDED HISTORY: Chest pain TECHNOLOGIST PROVIDED HISTORY: Chest pain Reason for Exam: chest pain FINDINGS: The patient is rotated to the left.  There is no confluent airspace consolidation or pleural effusion.  The cardiomediastinal silhouette, pulmonary vessels and interstitium are unremarkable.  Lung volumes are small.     Hypoventilatory chest with no acute findings.              ED BEDSIDE ULTRASOUND:       LABS:  Labs Reviewed   BASIC METABOLIC PANEL - Abnormal; Notable for the following components:       Result Value    Glucose 100 (*)     Calcium 8.5 (*)     All other components within normal limits   CBC WITH AUTO DIFFERENTIAL - Abnormal; Notable for the following components:    MCHC 34.0 (*)     All other components within normal limits   D-DIMER, QUANTITATIVE   MAGNESIUM   TROPONIN   TROPONIN       I reviewed all his lab results, these are unremarkable.  D-dimer is negative and 2 sets of cardiac markers are normal.    EMERGENCY DEPARTMENT COURSE:   Vitals:    Vitals:    02/27/23 2003   BP: 123/79   Pulse: 54   Resp: 18   Temp: 97.3 F (36.3 C)   TempSrc: Tympanic   SpO2: 95%   Weight: 105.7 kg (233 lb)   Height: 1.829 m (6')     -------------------------  BP: 123/79, Temp: 97.3 F (36.3 C), Pulse: 54, Respirations:  18    Orders Placed This Encounter   Medications    aspirin chewable tablet 324 mg       During the emergency department course, patient was put on the monitor which reveals normal sinus rhythm without any ectopy.  He was given aspirin 324 mg orally.  Twelve-lead EKG was obtained.  CBC, chemistries, D-dimer, 2 sets of cardiac markers and chest x-ray are obtained.  Patient's of the workup is unremarkable.  He has been resting comfortably and does not appear to be in any significant pain or any distress.  He prefers to go home and will follow-up with his primary care provider for further evaluation and possibly outpatient stress test.  He is advised to return if chest pain recurs or if he develops any new symptoms.  He is advised to call us if any questions, concerns or problems.       The patient presents with chest pain that is not suggesting in nature of pulmonary emnbolus, aortic dissection, cardiac ischemia, aortic dissection, or other serious etiology. Given the extremely low risk of these diagnoses further testing and evaluation for these possibilites are not indicated at htis time. The patient has been instructed to return if the symptpoms change or worsen in any way.    The patient understands that at this time there is no evidence for a more malignant underlying process, but also understands that early in the process of an illness or injury, an emergency department workup can be falsely reassuring.  Routine discharge counseling was given, and it is understood that worsening, changing or persistent symptoms should prompt an immediate call or follow up with their primary physician or return to the emergency department. The importance of appropriate follow up was also discussed.  I have reviewed the disposition diagnosis.  I have answered the questions and given discharge instructions.  There was voiced understanding of these instructions and no further questions or complaints.    Furthermore, the patient is  without objective evidence of an acute process requiring hospitalization or inpatient management. They have remained hemodynamically stable and are stable for discharge with outpatient follow-up.      Standard anticipatory guidance given to patient upon discharge.  Have given them a specific time frame in which to follow-up and who to follow-up with.  I have also advised them that they should return to the emergency department if they get worse, or not getting better or develop any new or concerning symptoms.      I have reviewed the disposition diagnosis with the patient and or their family/guardian.  I have answered their questions and given discharge instructions.  They voiced understanding of these instructions and did not have any further questions or complaints.       Re-evaluation Notes    Patient is resting comfortably and does not appear to be in any pain or distress prior to discharge    CRITICAL CARE:   None        CONSULTS:  PROCEDURES:  None    FINAL IMPRESSION      1. Chest pain, unspecified type          DISPOSITION/PLAN   DISPOSITION Decision To Discharge    Condition on Disposition    Stable    PATIENT REFERRED TO:  Mellissa Kohut, APRN - NP  [address]  [place]  [place] [address]  [phone]    Call in 1 day  For reevaluation of current symptoms    [facility]  [address]  [place] [address]  [phone]    If symptoms worsen, If chest pain recurs or any new symptoms appear      DISCHARGE MEDICATIONS:  Discharge Medication List as of 02/27/2023 11:35 PM          (Please note that portions of this note were completed with a voice recognition program.  Efforts were made to edit the dictations but occasionally words are mis-transcribed.)    Lenice Pressman, MD,, MD, F.A.C.E.P.  Attending Emergency Physician                          Lenice Pressman, MD  02/28/23 [phone]

## 2023-02-28 ENCOUNTER — Inpatient Hospital Stay
Admit: 2023-02-28 | Discharge: 2023-02-28 | Disposition: A | Discharge status: Home / Self Care | Payer: PRIVATE HEALTH INSURANCE | Attending: Specialist

## 2023-02-28 LAB — BASIC METABOLIC PANEL
Anion Gap: 11 mmol/L (ref 9–17)
BUN: 9 mg/dL (ref 6–20)
Bun/Cre Ratio: 10 (ref 9–20)
CO2: 24 mmol/L (ref 20–31)
Calcium: 8.5 mg/dL — ABNORMAL LOW (ref 8.6–10.4)
Chloride: 104 mmol/L (ref 98–107)
Creatinine: 0.9 mg/dL (ref 0.7–1.2)
Est, Glom Filt Rate: >90 mL/min/{1.73_m2} (ref >60)
Glucose: 100 mg/dL — ABNORMAL HIGH (ref 70–99)
Potassium: 3.9 mmol/L (ref 3.7–5.3)
Sodium: 139 mmol/L (ref 135–144)

## 2023-02-28 LAB — CBC WITH AUTO DIFFERENTIAL
Absolute Immature Granulocyte: <0.03 10*3/uL (ref 0.00–0.30)
Basophils %: 1 % (ref 0–2)
Basophils Absolute: 0.09 10*3/uL (ref 0.00–0.20)
Eosinophils %: 3 % (ref 1–4)
Eosinophils Absolute: 0.22 10*3/uL (ref 0.00–0.44)
Hematocrit: 46.5 % (ref 40.7–50.3)
Hemoglobin: 15.8 g/dL (ref 13.0–17.0)
Immature Granulocytes: 0 %
Lymphocytes %: 34 % (ref 24–43)
Lymphocytes Absolute: 2.5 10*3/uL (ref 1.10–3.70)
MCH: 28.8 pg (ref 25.2–33.5)
MCHC: 34 g/dL — ABNORMAL HIGH (ref 25.2–33.5)
MCV: 84.9 fL (ref 82.6–102.9)
MPV: 9.9 fL (ref 8.1–13.5)
Monocytes %: 8 % (ref 3–12)
Monocytes Absolute: 0.55 10*3/uL (ref 0.10–1.20)
NRBC Automated: 0 per 100 WBC
Neutrophils %: 54 % (ref 36–65)
Neutrophils Absolute: 3.93 10*3/uL (ref 1.50–8.10)
Platelets: 266 10*3/uL (ref 138–453)
RBC: 5.48 m/uL (ref 4.21–5.77)
RDW: 12.7 % (ref 11.8–14.4)
WBC: 7.3 10*3/uL (ref 3.5–11.3)

## 2023-02-28 LAB — TROPONIN
Troponin, High Sensitivity: <6 ng/L (ref 0–22)
Troponin, High Sensitivity: <6 ng/L (ref 0–22)

## 2023-02-28 LAB — MAGNESIUM: Magnesium: 2 mg/dL (ref 1.6–2.6)

## 2023-02-28 LAB — D-DIMER, QUANTITATIVE: D-Dimer, Quant: <0.27 ug/mL FEU (ref 0.00–0.59)

## 2023-02-28 MED ORDER — ASPIRIN 81 MG PO CHEW
81 | Freq: Once | ORAL | Status: AC
Start: 2023-02-28 — End: 2023-02-27
  Administered 2023-02-28: 324 mg via ORAL

## 2023-02-28 MED FILL — ASPIRIN 81 MG PO CHEW: 81 MG | ORAL | Qty: 4

## 2023-03-03 LAB — EKG 12-LEAD
Atrial Rate: 49 {beats}/min
P Axis: 27 degrees
P-R Interval: 162 ms
Q-T Interval: 414 ms
QRS Duration: 86 ms
QTc Calculation (Bazett): 373 ms
R Axis: 41 degrees
T Axis: 40 degrees
Ventricular Rate: 49 {beats}/min

## 2023-04-24 ENCOUNTER — Inpatient Hospital Stay: Admit: 2023-04-24 | Payer: PRIVATE HEALTH INSURANCE

## 2023-04-24 ENCOUNTER — Ambulatory Visit: Admit: 2023-04-24 | Discharge: 2023-04-24 | Payer: PRIVATE HEALTH INSURANCE | Attending: Neurology

## 2023-04-24 DIAGNOSIS — G40909 Epilepsy, unspecified, not intractable, without status epilepticus: Secondary | ICD-10-CM

## 2023-04-24 MED ORDER — DIVALPROEX SODIUM 500 MG PO TBEC
500 | ORAL_TABLET | Freq: Three times a day (TID) | ORAL | 5 refills | Status: AC
Start: 2023-04-24 — End: ?

## 2023-04-24 MED ORDER — SUMATRIPTAN SUCCINATE 100 MG PO TABS
100 | ORAL_TABLET | ORAL | 2 refills | Status: AC
Start: 2023-04-24 — End: ?

## 2023-04-24 MED ORDER — UBRELVY 100 MG PO TABS
100 | ORAL_TABLET | Freq: Two times a day (BID) | ORAL | 2 refills | Status: AC | PRN
Start: 2023-04-24 — End: ?

## 2023-04-24 MED ORDER — BUTALBITAL-APAP-CAFFEINE 50-325-40 MG PO TABS
50-325-40 | ORAL_TABLET | ORAL | 1 refills | Status: AC
Start: 2023-04-24 — End: ?

## 2023-04-24 NOTE — Progress Notes (Signed)
[facility]  Neurology    [address], [place]  [address]  Phone:[phone]   Fax: [phone]        SUBJECTIVE:       PATIENT ID:  Larry Koch is a  RIGHT    HANDED 42 y.o. male.      Migraine   This is a chronic problem. Episode onset: FOR   15  YEARS. The problem occurs intermittently. The problem has been waxing and waning. Associated symptoms include back pain, blurred vision, dizziness, insomnia, a loss of balance, neck pain, numbness, phonophobia, photophobia, tingling, a visual change and weakness. Pertinent negatives include no abdominal pain, abnormal behavior, anorexia, coughing, drainage, ear pain, eye pain, eye redness, eye watering, facial sweating, fever, hearing loss, muscle aches, nausea, rhinorrhea, scalp tenderness, seizures, sinus pressure, sore throat, swollen glands, tinnitus, vomiting or weight loss. The symptoms are aggravated by unknown. He has tried nothing for the symptoms. The treatment provided no relief.   Seizures  This is a chronic problem. Episode onset: SINCE    2015  The problem occurs intermittently (DURING     HIS  SLEEP  NIGHT  TIME  ). The problem has been waxing and waning. Associated symptoms include diaphoresis, neck pain, numbness, a visual change and weakness. Pertinent negatives include no abdominal pain, anorexia, arthralgias, change in bowel habit, chest pain, chills, congestion, coughing, fatigue, fever, headaches, joint swelling, myalgias, nausea, rash, sore throat, swollen glands, urinary symptoms, vertigo or vomiting. Associated symptoms comments: MEMORY   PROBLEMS  . Nothing aggravates the symptoms. He has tried nothing for the symptoms. The treatment provided no relief.   Cerebrovascular Accident  This is a chronic problem. Episode onset: SINCE   2017   The problem has been resolved. Associated symptoms include diaphoresis, neck pain, numbness, a visual change and weakness. Pertinent negatives include no abdominal pain,  anorexia, arthralgias, change in bowel habit, chest pain, chills, congestion, coughing, fatigue, fever, headaches, joint swelling, myalgias, nausea, rash, sore throat, swollen glands, urinary symptoms, vertigo or vomiting. Associated symptoms comments: RESIDUAL   LEFT   HEMIPARESIS  . Nothing aggravates the symptoms. He has tried nothing for the symptoms. The treatment provided no relief.             History obtained from  The   PATIENT         and other  available   medical  records   were  Also  reviewed.            The  Duration,  Quality,  Severity,  Location,  Timing,  Context,  Modifying  Factors   Of   The   Chief   Complaint       And  Present  Illness  Was   Reviewed   In   Chronological   [facility].                                            PATIENT'S  MAIN  CONCERNS INCLUDE :                       1)    KNOWN     H/O    MIGRAINES    AND  HEADACHES  FOR     20   YEARS                               ASSOCIATED     WITH   NAUSEA ,    VOMITING,  PHOTOPHOBIA                                                                                 2)          H/O    SEIZURES       DURING   NIGHT   TIME         SINCE                                          2015        AS    WITNESSED   BY    HIS   EX   WIFE                                3)        FAMILY     HISTORY    OF   SEIZURES                                         -   MATERNAL  SIDE                            4)     NO     PREVIOUS    H/O    HEAD INJURY.                                          5)        PREVIOUS     STROKE       IN    2017                                     WITH   HOSPITALIZATION    FOR  TWO  WEEKS                                  H/O     MILD   LEFT      RESIDUAL     HEMIPARESIS                                   -   PATIENT  ADVISED     TO   ASA  81   MG  PO  DAILY                          6)      PREVIOUS     H/O   NEUROLOGY    EVALUATIONS      AT                                  [facility]  CLINIC                           BEING     FOLLOWED    BY    [facility]   F[facility]    AT   [facility]  [place]   IN                            7)       PATIENT     DENIES      ANY    TOBACCO ,  ALCOHOL                                    OR     ILLEGAL   DRUG   USAGE                                 8)       H/O    CHRONIC    ANXIETY,   DEPRESSION ,  BIPOLAR  DISORDER                                                  FOR   20   YEARS                             -          ON    EFFEXOR,   TRAZODONE     SEROQUEL,   PRAZOSIN                                     BEING        FOLLOWED   WITH    MENT[facility]  [facility]                                      AT    [place]                      9)       H/O    CHRONIC     MILD    MEMORY  PROBLEMS                                         -  STABLE                                    NEEDS  MONITORING                       10)   H/O    MILD     BALANCE  PROBLEMS     -   STABLE                       11)      H/O     CHRONIC    MILD  BRIEF    DIZZINESS                                            INTERMITTENTLY    -     ON  ASA   81   MG                                      -    STABLE                               12)      CO  MORBID  MULTIPLE  MEDICAL   CONDITION                                         TO  FOLLOW  WITH    HIS  PCP                                      13)       HAD    NEURO  DIAGNOSTIC  EVALUATIONS  IN   DEC  2021/  JAN. 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                              C)     LABS     INCLUDING    HYPER COAGULABLE   WORK                                     SHOWED       SHOWED    NO  SIGNIFICANT  ABNORMALITIES                             D)          EEG     SHOWED                                       mild-to-moderate irritable diffuse cerebral dysfunction                             E)     EMG / NC  STUDIES   SHOWED                                        SENSORY PERIPHERAL  POLYNEUROPATHY                                           MORE  SO   BOTH  LOWER   EXTREMITIES                            14)       H/O     INTOLERANCE   TO   NSAID     MEDICATION                         15)     PREVIOUS     H/O     WAKING  UP     EPISODES  ON  BATH   ROOM   FLOOR                                    AND    FURNITURE  IN    BED TIME    ROOM  IN         FEB.  /  MARCH     2022                    16)      PATIENT  DID  NOT   HAVE  NEUROLOGY  FOLLOW  UP   FROM                        MAY   2022      TO  NOV.  2023     AS    [place]AS  [place]                             PATIENT  RETURNED  BACK  TO  [place]                                 WITH    CONTINUED  MIGRAINES    AND  WAKING  EPISODES                           ON  FLOOR   ONCE  A  WEEK    AND  MEMORY PROBLEMS                         17)     PATIENT  HAD  NEURODIAGNOSTIC  EVALUATIONS                             A)   CT  HEAD   AND  CAROTID  DOPPLER    IN   DEC.  2023                                      SHOWED  NO  SIGNIFICANT  ABNORMALITIES  '                       B)   EEG   IN      JAN. 2024     ABNORMAL    REPORTED  AS                           "    Intermittent sharp waves, slow sharp waves noted during                                 post-hyperventilation period suggestive of underlying                               epileptogenic abnormality."                             C)  LABS    IN    DEC.  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                         19)      H/O   NON  COMPLIANCE   WITH  DEPAKOTE    COUPLE  OF  DAYS    A  WEEK                               WHICH INCREASES   RISK  FOR    SEIZURE  RECURRENCES                             20)      PATIENT    REQUESTED   IN  MAY  2024     NEUROLOGICAL    CLEARANCE                               FOR   GI    PROCEDURES                       21)         RECOMMENDATIONS  :                                                        A)   SEIZURE  PRECAUTIONS                             B)   SEIZURE  DAIRY                              C)   COMPLIANCE  WITH   ANTI EPILEPTIC MEDICATION   AND                                           MEDICAL  ADVICE                               D)    FALL PRECAUTIONS                                E)    TO  CONTINUE   DEPAKOTE    500   MG    PO   TID                                            FOR   MIGRAINE    AND   SEIZURE    MANAGEMENT                               F)     ASA  81   MG  PO    DAILY                                  G)     IMITREX  ,   UBRELVY       AS   NEEDED                                             FOR   MIGRAINES  HEADACHES                               H)    PERIODIC  LABS  INCLUDING   DEPAKOTE  LEVEL                              I)    FOLLOW  UP    WITH    PCP,   OTHER  CONSULTANTS                                           AND  [facility]                             J)     FOLLOW   UP    NEURO  DIAGNOSTIC   EVALUATIONS                                         CLEARANCE  FOR  SURGERY  AS  REQUESTED                                                          --   REVIEWED     AND  DISCUSSED    WITH  PATIENT                                                                              22)         VARIOUS  RISK   FACTORS   WERE  REVIEWED   AND   DISCUSSED.                       PATIENT   HAS  MULTIPLE   MEDICAL, NEUROLOGICAL                        AND   MENTAL HEALTH   PROBLEMS .  PATIENT'S   MANAGEMENT  IS  CHALLENGING.                                         PRECIPITATING  FACTORS: including  fever/infection, exertion/relaxation, position change, stress,                weather change,   medications/alcohol, time of day/darkness/light  Are  present                                                          MODIFYING  FACTORS:  fever/infection, exertion/relaxation, position change, stress, weather change,               medications/alcohol, time of day/darkness/light  Are  present                Patient   Indicates   The  Presence   And  The  Absence  Of  The  Following    Associated  And             Additional  Neurological    Symptoms:                                Balance  And coordination   problems  present           Gait problems     present            Headaches      present              Migraines           present           Memory problemspresent              Confusion        absent            Paresthesia   numbness          present           Seizures  And  Starring  Episodes           present           Syncope,  Near  syncopal episodes         absent           Speech   problems           absent             Swallowing   Problems      absent            Dizziness,  Light headedness           present              Vertigo        absent             Generalized   Weakness    absent              focal  Weakness     present             Tremors  absent              Sleep  Problems     present             History  Of   Recent  Head  Injury     absent             History  Of   Recent  TIA     absent             History  Of   Recent    Stroke     absent             Neck  Pain   and   Neck muscle  Spasms  present               Radiating  down   And   Weakness           present            Lower back   Pain  And     Spasms  present              Radiating    Down   And   Weakness          present                H/OFALLS        present               History  Of   Visual  Symptoms    absent                  Associated   Diplopia       absent                                                Also   Additional   Symptoms   Present    As  Documented    In   The   detailed                  Review  Of  Systems   And    Please   Refer   To    Them for   Additional    Information.                    Any components  That are either  Unobtainable  Or  Limited  In   HPI, ROS  And/or PFSH   Are                   Due   ToPatient's  Medical  Problems,  Clinical  Condition   and/or lack of                                 other    Alternate   resources.            RECORDS   REVIEWED:    historical medical records           INFORMATION   REVIEWED:     MEDICAL   HISTORY,SURGICAL   HISTORY,     MEDICATIONS   LIST,   ALLERGIES AND  DRUG  INTOLERANCES,  FAMILY   HISTORY,  SOCIAL  HISTORY,      PROBLEM  LIST   FOR  PATIENT  CARE   COORDINATION          Past Medical History:   Diagnosis Date    Anxiety     Cerebral artery occlusion with cerebral infarction (HCC) 2017    GERD (gastroesophageal reflux disease)     Glaucoma     Personality disorder (HCC)     PTSD (post-traumatic stress disorder)     PUD (peptic ulcer disease)     Seizures (HCC)          Past Surgical History:   Procedure Laterality Date    TONSILLECTOMY           Current Outpatient Medications   Medication Sig Dispense Refill    Sodium Fluoride 1.1 % PSTE BRUSH SMALL AMOUNT DENT AT BEDTIME TOOTH DECAY PREVENTION DO NOT RINSE AFTER USE. SPIT OUT EXCESS.      doxycycline hyclate (VIBRA-TABS) 100 MG tablet 1 tablet      psyllium (KONSYL) 28.3 % PACK Take 1 packet by mouth daily      lamoTRIgine (LAMICTAL) 100 MG tablet Take 1 tablet by mouth daily      valproic acid (DEPAKENE) 250 MG capsule Take 1 capsule by mouth 4 times daily      azithromycin (ZITHROMAX) 250 MG tablet Take 1 tablet by mouth daily      pantoprazole (PROTONIX) 40 MG tablet 1 tablet      vitamin D (CHOLECALCIFEROL) 25 MCG (1000 UT) TABS tablet 50 mcg      prazosin (MINIPRESS) 2 MG capsule 1 capsule      dicyclomine (BENTYL) 10 MG capsule Take 2 capsules by mouth 4 times daily  (before meals and nightly)      colestipol (COLESTID) 1 g tablet Take 1 tablet by mouth 2 times daily      traZODone (DESYREL) 100 MG tablet 1 tablet      famotidine (PEPCID) 20 MG tablet TAKE TWO TABLETS BY MOUTH AT BEDTIME      divalproex (DEPAKOTE) 500 MG DR tablet Take 1 tablet by mouth 3 times daily ONE  TABLET    3      TIMES  DAILY 90 tablet 5    SUMAtriptan (IMITREX) 100 MG tablet ONE   TABLET     TWICE  DAILY  AS  NEEDED   FOR  MIGRAINES 12 tablet 2    Ubrogepant (UBRELVY) 100 MG TABS Take 100 mg by mouth 2 times daily as needed (FOR  MIGRAINE) 30 tablet 2    butalbital-acetaminophen-caffeine (ESGIC) 50-325-40 MG per tablet ONE  TO  TWO  TABLETS  IN  THE  EVENING  AND  BED TIME  AS  NEEDED 60 tablet 1    cloNIDine (CATAPRES) 0.1 MG tablet       QUEtiapine (SEROQUEL) 25 MG tablet Take 8 tablets by mouth daily Taking 1/2 tablet daily.      venlafaxine (EFFEXOR XR) 37.5 MG extended release capsule TAKING 225MG  DAILY      Multiple Vitamins-Minerals (MULTIVITAMINS/MINERALS ADULT PO)       omeprazole (PRILOSEC) 20 MG delayed release capsule Take 1 capsule by mouth 2 times daily       No current facility-administered medications for this visit.         Allergies   Allergen Reactions    Blueberry Anaphylaxis    Penicillins Seizure     Per Pt induced Seizures  as a child.    Other      blueberries, raspberries, blackberries. Vaccination reaction.    Sulfamethoxazole-Trimethoprim Nausea Only    Food Nausea And Vomiting     Blue cheese per Pt.         Family History   Problem Relation Age of Onset    Mental Illness Mother     Diabetes Mother     Diabetes Father     Heart Disease Father     Lung Cancer Father          Social History     Socioeconomic History    Marital status: Divorced     Spouse name: Not on file    Number of children: Not on file    Years of education: Not on file    Highest education level: Not on file   Occupational History    Not on file   Tobacco Use    Smoking status: Never    Smokeless tobacco:  Never   Vaping Use    Vaping Use: Never used   Substance and Sexual Activity    Alcohol use: Not Currently     Comment: 1 drink per year    Drug use: No    Sexual activity: Yes     Partners: Female   Other Topics Concern    Not on file   Social History Narrative    Not on file     Social Determinants of Health     Financial Resource Strain: Not on file   Food Insecurity: Not on file   Transportation Needs: Not on file   Physical Activity: Not on file   Stress: Not on file   Social Connections: Not on file   Intimate Partner Violence: Not on file   Housing Stability: Not on file       Vitals:    04/24/23 1142   BP: 126/76   Pulse: 80   Resp: 16   SpO2: 96%         Wt Readings from Last 3 Encounters:   04/24/23 107.5 kg (237 lb)   02/27/23 105.7 kg (233 lb)   10/31/22 106.6 kg (235 lb)         BP Readings from Last 3 Encounters:   04/24/23 126/76   02/27/23 123/79   10/31/22 126/76           Hematology and Coagulation    Lab Results   Component Value Date/Time    WBC 7.3 02/27/2023 08:20 PM    RBC 5.48 02/27/2023 08:20 PM    HGB 15.8 02/27/2023 08:20 PM    HCT 46.5 02/27/2023 08:20 PM    MCV 84.9 02/27/2023 08:20 PM    MCH 28.8 02/27/2023 08:20 PM    MCHC 34.0 02/27/2023 08:20 PM    RDW 12.7 02/27/2023 08:20 PM    PLT 266 02/27/2023 08:20 PM    MPV 9.9 02/27/2023 08:20 PM         Chemistries    Lab Results   Component Value Date/Time    NA 139 02/27/2023 08:20 PM    K 3.9 02/27/2023 08:20 PM    CL 104 02/27/2023 08:20 PM    CO2 24 02/27/2023 08:20 PM    BUN 9 02/27/2023 08:20 PM    CREATININE 0.9 02/27/2023 08:20 PM    CALCIUM 8.5 02/27/2023 08:20 PM    BILITOT 0.2 06/22/2016 10:45 PM    ALKPHOS 74 06/22/2016 10:45 PM    AST  19 10/31/2022 12:22 PM    ALT 13 06/22/2016 10:45 PM     Lab Results   Component Value Date/Time    ALKPHOS 74 06/22/2016 10:45 PM    ALT 13 06/22/2016 10:45 PM    AST 19 10/31/2022 12:22 PM    BILITOT 0.2 06/22/2016 10:45 PM     Lab Results   Component Value Date/Time    BUN 9 02/27/2023 08:20 PM     CREATININE 0.9 02/27/2023 08:20 PM     Lab Results   Component Value Date/Time    CALCIUM 8.5 02/27/2023 08:20 PM    MG 2.0 02/27/2023 08:20 PM     Lab Results   Component Value Date/Time    AST 19 10/31/2022 12:22 PM    ALT 13 06/22/2016 10:45 PM         Review of Systems   Constitutional:  Positive for diaphoresis. Negative for appetite change, chills, fatigue, fever, unexpected weight change and weight loss.   HENT:  Negative for congestion, dental problem, drooling, ear discharge, ear pain, facial swelling, hearing loss, mouth sores, nosebleeds, postnasal drip, rhinorrhea, sinus pressure, sore throat, tinnitus, trouble swallowing and voice change.    Eyes:  Positive for blurred vision and photophobia. Negative for pain, discharge, redness, itching and visual disturbance.   Respiratory:  Negative for apnea, cough, choking, chest tightness, shortness of breath and wheezing.    Cardiovascular:  Negative for chest pain, palpitations and leg swelling.   Gastrointestinal:  Negative for abdominal distention, abdominal pain, anorexia, blood in stool, change in bowel habit, constipation, diarrhea, nausea and vomiting.   Endocrine: Negative for cold intolerance, heat intolerance, polydipsia, polyphagia and polyuria.   Musculoskeletal:  Positive for back pain, gait problem and neck pain. Negative for arthralgias, joint swelling, myalgias and neck stiffness.   Skin:  Negative for color change, pallor, rash and wound.   Allergic/Immunologic: Negative for environmental allergies, food allergies and immunocompromised state.   Neurological:  Positive for dizziness, tingling, weakness, light-headedness, numbness and loss of balance. Negative for vertigo, tremors, seizures, syncope, facial asymmetry, speech difficulty and headaches.   Hematological:  Negative for adenopathy. Does not bruise/bleed easily.   Psychiatric/Behavioral:  Positive for decreased concentration and sleep disturbance. Negative for agitation, behavioral  problems, confusion, dysphoric mood, hallucinations, self-injury and suicidal ideas. The patient is nervous/anxious and has insomnia. The patient is not hyperactive.          OBJECTIVE:      Physical Exam  Constitutional:       Appearance: He is well-developed.   HENT:      Head: Normocephalic and atraumatic. No raccoon eyes or Battle's sign.      Right Ear: External ear normal.      Left Ear: External ear normal.      Nose: Nose normal.   Eyes:      Conjunctiva/sclera: Conjunctivae normal.      Pupils: Pupils are equal, round, and reactive to light.   Neck:      Thyroid: No thyroid mass or thyromegaly.      Vascular: No carotid bruit.      Trachea: No tracheal deviation.      Meningeal: Brudzinski's sign and Kernig's sign absent.   Cardiovascular:      Rate and Rhythm: Normal rate and regular rhythm.   Pulmonary:      Effort: Pulmonary effort is normal.   Musculoskeletal:         General: No tenderness.  Cervical back: Normal range of motion and neck supple. No rigidity. No muscular tenderness. Normal range of motion.   Skin:     General: Skin is warm.      Coloration: Skin is not pale.      Findings: No erythema or rash.      Nails: There is no clubbing.   Psychiatric:         Attention and Perception: He is attentive.         Mood and Affect: Mood is not anxious or depressed. Affect is not labile, blunt or inappropriate.         Speech: He is communicative. Speech is not rapid and pressured, delayed, slurred or tangential.         Behavior: Behavior is not agitated, slowed, aggressive, withdrawn, hyperactive or combative. Behavior is cooperative.         Thought Content: Thought content is not paranoid or delusional. Thought content does not include homicidal or suicidal ideation. Thought content does not include homicidal or suicidal plan.         Cognition and Memory: Memory is impaired. He exhibits impaired recent memory. He does not exhibit impaired remote memory.         Judgment: Judgment is not  impulsive or inappropriate.               NEUROLOGICALEXAMINATION :      A) MENTAL STATUS:                   Alert and  oriented  To time, place  And  Person.                  No Aphasia.  No  Dysarthria.                    Able   To  Follow     SIMPLE    commands   without   Any  Difficulty.                   No right  To left confusion.                  Normal  Speech  And language function.                   Insight and  Judgment ,Fund  Of  Knowledge   within normal limits                Recent  And  Remote memory    DECREASED                Attention &  Concentration are        DECREASED                                            B) CRANIAL NERVES :        CN : Visual  Acuity  And  Visual fields  within normal limits               Fundi  Could  Not  Be  Could  Not  Be  Evaluated.           3,4,6 CN : Both  Pupils are   Reactive and  Equal.  Movements  Are  Intact.  No  Nystagmus.  No  INO.  No  Afferent  Pupillary  Defect noted.          5 CN :  Normal  Facial sensations and Corneal  Reflexes           7 CN:  Normal  Facial  Symmetry  And  Strength.  No facial  Weakness.           8 CN :  Hearing  Appears within normal limits          9, 10 CN: Normal   spontaneous, reflex   palate   movements         11 CN:   Normal  Shoulder  shrug and  strength         12 CN :   Normal  Tongue movements and  Tongue  In midline                        No tongue   Fasciculations or atrophy         C) MOTOR  EXAM:                   Strength  In upper  And  Lower   extremities     DECREASED                          MILD   RESIDUAL  LEFT  HEMIPARESIS                             DECREASED   HAND  GRIPS                   No  Drift.     No  Atrophy               Rapid   alternating  And  repetitions  Movements   DECREASED                 Muscle  Tone  In upper  And  Lower  Extremities  normal                No rigidity.  No  Spasticity.                 Bradykinesia   absent                 No  Asterixis.               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)    Bipolar II disorder (HCC)    Borderline personality disorder (HCC)    Seizure disorder (HCC)    Headache    Insomnia    Low back pain    Lumbar spondylosis    Migraine without aura and without status migrainosus, not intractable    Obstructive sleep apnea syndrome in adult    Pituitary cyst (HCC)    Restless legs    Sleep apnea    H/O: stroke    Chronic tension-type headache, intractable    Memory problem    Dizziness    Eye pain    Abnormal EEG    Chronic cerebral ischemia    Middle cerebral artery stenosis, right    Parasomnia    Allergic reaction to drug    Adjustment disorder with depressed mood    Adjustment disorder with disturbance of conduct    Stress and adjustment reaction    Anxiety disorder    Panic disorder (episodic paroxysmal anxiety)    Generalized anxiety disorder    Other reactions to severe stress    Other specific personality disorders (HCC)    Panic disorder    Personality disorder (HCC)    Ataxia    Atypical chest pain    Chest wall contusion, right, initial encounter    Chronic post-traumatic stress disorder (PTSD)    Post-traumatic stress disorder, chronic    Constipation    Contusion of left forearm    Contusion of left leg    Dental caries    Dysphagia, unspecified    Dysphagia, pharyngoesophageal phase     Gastro-esophageal reflux disease without esophagitis    Gastroesophageal reflux disease    Heartburn    Hemorrhoids without complication    Hypertension    Localization-related (focal) (partial) symptomatic epilepsy and epileptic syndromes with complex partial seizures, intractable, without status epilepticus (HCC)    Major depressive disorder, recurrent, moderate (HCC)    Major depressive disorder, recurrent episode, mild (HCC)    Moderate recurrent major depression (HCC)    Major depressive disorder, recurrent, severe with psychotic symptoms (HCC)    Major depressive disorder, single episode, unspecified    Migraine, unspecified, not intractable, without status migrainosus    Migraine    Myopia    Nocturia    Open wound of face    Pain in joint, pelvic region and thigh    Pain in throat    Prostatitis    Rash and other nonspecific skin eruption    Regular astigmatism    RLQ abdominal pain    Snoring    Strain of left  shoulder    Stress fracture    Tinnitus    Unspecified visual field defects    Unspecified visual loss    Urinary frequency    Urinary urgency    Vitamin D deficiency    Vomiting    Weight gain       MRI OF THE BRAIN WITHOUT CONTRAST  10/23/2020 8:32 am       TECHNIQUE:   Multiplanar multisequence MRI of the brain was performed without the   administration of intravenous contrast.       COMPARISON:   None.       HISTORY:   ORDERING SYSTEM PROVIDED HISTORY: Bipolar II disorder, most recent episode   major depressive (HCC)   TECHNOLOGIST PROVIDED HISTORY:   migraine       Initial evaluation.       FINDINGS:   INTRACRANIAL STRUCTURES/VENTRICLES: There is no acute infarct. No mass effect   or midline shift. No evidence of an acute intracranial hemorrhage.  The   ventricles and sulci are normal in size and configuration.  The   sellar/suprasellar regions appear unremarkable.  The normal signal voids   within the major intracranial vessels appear maintained.       ORBITS: The visualized portion of the  orbits demonstrate no acute abnormality.       SINUSES: Mucous retention cysts seen in the left maxillary sinus.  The   mastoid air cells demonstrate no acute abnormality.       BONES/SOFT TISSUES: The bone marrow signal intensity appears normal. The soft   tissues demonstrate no acute abnormality.           Impression   No acute intracranial abnormality.  No acute infarct.               VISITING DIAGNOSIS:      ICD-10-CM    1. Seizure disorder (HCC)  G40.909       2. Bipolar II disorder, most recent episode major depressive (HCC)  F31.81       3. Dizziness  R42       4. 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 Borderline personality disorder (HCC)  F60.3       6. Chronic neck pain  M54.2     G89.29       7. H/O: stroke  Z48.73       8. Primary insomnia  F51.01       9. Other sleep apnea  G47.39       10. Low back pain with sciatica, sciatica laterality unspecified, unspecified back pain laterality, unspecified chronicity  M54.40       11. Memory problem  R41.3       12. Migraine without aura and without status migrainosus, not intractable  G43.009       13. Obstructive sleep apnea syndrome in adult  G47.33       14. Chronic tension-type headache, intractable  G44.221                    CONCERNS   &   INCREASED   RISK   FOR        * TIA,  CEREBRO  VASCULAR  ISCHEMIA, STROKE     *   DIZZINESS,   VERTEBROBASILAR  INSUFFICIENCY ,         *  SEIZURE  ACTIVITY,  EPILEPSY ,        *   COMPLICATED  MIGRAINES      *    CHRONIC  TENSION  HEADACHES      *   COGNITIVE  &   MEMORY PROBLEMS        *  MONONEUROPATHIES,   RADICULOPATHY       *   PERIPHERAL  NEUROPATHY,       *  GAIT  DIFFICULTIES  &   BALANCE PROBLMES   AND  FALL                VARIOUS  RISK   FACTORS   WERE  REVIEWED   AND   DISCUSSED.          *  PATIENT   HAS  MULTIPLE   MEDICAL, NEUROLOGICAL                        AND   MENTAL HEALTH   PROBLEMS .           PATIENT'S   MANAGEMENT  IS  CHALLENGING.            PLAN:                         Ashby Dawes  Of  The   Diagnoses,  The  Management & Treatment  Options            AND    Care  plan  Were          Reviewed and   Discussed   With  patient.           * Goals  And  Expectations  Of  The  Therapy  Discussed   And  Reviewed.          *   Benefits   And   Side  Effect  Profile  Of  Medication/s   Were   Discussed                * Need   For  Further   Follow up For  The  Various  Problems Were  discussed.          * Results  Of  The  Previous  Diagnostic tests were reviewed and  discussed                   Medical  Decision  Making  Was  Made  Based on the   Complexity  Of  Above  Mentioned  Diagnoses,    Data reviewed             And    Risk  Of  Significant   Co morbidities and   complicating   Factors.                 Medical  Decision  Was     High    Complexity   Due   To  The  Patient's  Multiple  Symptoms  &  Disease,            Complex  Treatment  Regimen,  Multiple medications           and   Risk  Of   Side  Effects,  Difficulty  In  Medication  Management  And  Diagnostic  Challenges           In  View  Of  The  Associated   Co  Morbid  Conditions   And  Problems.                           * FALL   PRECAUTIONS.      THESE  REVIEWED   AND  DISCUSSED      *  USE   WALKING  ASSISTANCE  DEVICES     QUAD  CANE        *   BE  CAREFUL  WITH  ACTIVITIES           *   AVOID   NECK  AND/ BACK  STRAINING  ACTIVITIES            *   ADEQUATE   FLUID  INTAKE   AND  ELECTROLYTE  BALANCE           * KEEP  DAIRY  OF   THE  NEUROLOGICAL  SYMPTOMS        RECORDING THE    DURATION  AND  FREQUENCY.          *  AVOID    CONDITIONS  AND  FACTORS   THAT  MAKE                  NEUROLOGICAL  SYMPTOMS  WORSE.        *   SEIZURE  PRECAUTIONS.  -  REVIEWED                 A)  Avoid  Working  At   [employer].          B)  Avoid  Working  With  Engineer, manufacturing systems.          C)  Avoid   Swimming,  Climbing  A  Ladder   Unattended.          D)  Avoid   Driving   If  You   Have  A  Seizure.          E)  Must   Be  Seizure  Free   For  At   Least   6  months,  Before   You  Can drive.          F) Some times  Your  May  Feel  Seizure coming  Before  It  Begins.                You  May feelsmell or funny  Feeling  In  Your  Stomach,  Which is  Called   Aura.                     TIPS  TO  REDUCE/ PREVENT  SEIZURES           1.  Take  Your  Anti seizure  Medications   As   Recommended.       2.  Get   Enough   Sleep.   Sleep  Deprivation   Can  Trigger  A  Seizure.       3.  If   You   Have  A fever,  Treat  It  At  Once,  And  Contact   Your  Primary  Care Providers.  4. Avoid   Alcohol.       5. Avoid  Flashing  Lights,  Loud  Noises and  TV  And  Video  Games,             As   These  May  Trigger   Your  Seizures       6.  Control  Your  Stress  And   Have  Adequate  Rest.       7.   If  You  Feel  A  Seizure  Coming On :             A) warn people  Who  Are  With  You             B)  Make  Sure  There  Are  No  Sharp or  Hard  Objects  Around you.             C)  Lay down  On  Your  Side  And  Relax.                  *TO  MAINTAIN  REGULAR  SLEEP  WAKE  CYCLES.     *   TO  HAVE  ADEQUATE  REST  AND   SLEEP    HOURS.            *    AVOID  ANY USAGE OF    TOBACCO,          AVOID  EXCESSIVE  ALCOHOL  AND   ILLEGAL   SUBSTANCES          *  CONTINUE   MEDICATIONS    PRESCRIBED       AS    RECOMMENDED       *   Compliance   With  Medications   And  Instructions            *    MIGRAINE/ HEADACHE    DAIRY   WITH  MONITORING                       OF  DURATION  AND  FREQUENCY.        *  May   Use  Pill  Box,    If  Needed        *  MEDICATIONS TO AVOID:    WELLBUTRIN,  ULTRAM            *    Antiplatelet  therapy    As   Recommended  Was   Discussed      *    Prophylactic  Use   Of     Vitamin   B   Complex,  Folic  Acid,    Vitamin  B12    Multivitamin,       Calcium  With  magnesium  And  Vit D    Supplementations   Over  The  Counter  Discussed                *  PATIENT  IS  ALSO   COUNSELED   TO  KEEP    ACTIVITIES:         A)   SIMPLE      B)  ORGANIZED      C)  WRITEDOWN                     *  EVALUATIONS  AND  FOLLOW UP:   PERIODICALLY                  * PHYSICAL  THERAPY                                 *CARDIOLOGY              *   OPTHALMOLOGY                               *   RECOMMENDATIONS  :                                                        A)   SEIZURE  PRECAUTIONS                             B)   SEIZURE  DAIRY                              C)   COMPLIANCE  WITH   ANTI EPILEPTIC MEDICATION   AND                                           MEDICAL  ADVICE                               D)    FALL PRECAUTIONS                                E)    TO  CONTINUE   DEPAKOTE    500   MG    PO   TID                                            FOR   MIGRAINE    AND   SEIZURE    MANAGEMENT                               F)     ASA    81   MG  PO    DAILY                                  G)     IMITREX  ,   UBRELVY       AS   NEEDED                                             FOR   MIGRAINES  HEADACHES                               H)    PERIODIC  LABS  INCLUDING   DEPAKOTE  LEVEL                              I)    FOLLOW  UP    WITH    PCP,   OTHER  CONSULTANTS                                           AND  MENTAL  HEALTH PROFESSIONALS                             J)     FOLLOW   UP    NEURO  DIAGNOSTIC   EVALUATIONS                                         CLEARANCE  FOR  SURGERY  AS  REQUESTED                                                          --   REVIEWED     AND  DISCUSSED    WITH  PATIENT                                                                Controlled Substances Monitoring: Periodic Controlled Substance Monitoring: Possible medication side effects, risk of tolerance/dependence & alternative treatments discussed., No signs of potential drug abuse or diversion identified. (Sharmel Ballantine, Renard Matter, MD)              Orders Placed This Encounter    Procedures    CT HEAD WO CONTRAST    XR CERVICAL SPINE 1 VW    CBC    Electrolyte Panel    Valproic Acid Level, Total    EEG       Orders Placed This Encounter   Medications    butalbital-acetaminophen-caffeine (ESGIC) 50-325-40 MG per tablet     Sig: ONE  TO  TWO  TABLETS  IN  THE  EVENING  AND  BED TIME  AS  NEEDED     Dispense:  60 tablet     Refill:  1    divalproex (DEPAKOTE) 500 MG DR tablet     Sig: Take 1 tablet by mouth 3 times daily ONE  TABLET    3      TIMES  DAILY     Dispense:  90 tablet     Refill:  5    SUMAtriptan (IMITREX) 100 MG tablet     Sig: ONE   TABLET     TWICE  DAILY  AS  NEEDED   FOR  MIGRAINES     Dispense:  12 tablet     Refill:  2    Ubrogepant (UBRELVY) 100 MG TABS     Sig: Take 100 mg by mouth 2 times daily as needed (FOR  MIGRAINE)     Dispense:  30 tablet     Refill:  2               *PATIENT   TO  FOLLOW  UP  WITH   PRIMARY  CARE         OTHER  CONSULTANTS  AS  BEFORE.               *TO  FOLLOW  WITH   MENTAL  HEALTH  PROFESSIONALS ,  INCLUDING            PSYCHOLOGICAL  COUNSELING   AND  PSYCHIATRIC  EVALUATIONS                    --    REVIEWED     AND  DISCUSSED    WITH  PATIENT                         *  Maintain   Healthy  Life Style    With   Periodic  Monitoring  Of          Any  Medical  Conditions  Including   Elevated  Blood  Pressure,  Lipid  Profile,        Blood  Sugar levels  AndHeart  Disease.                *   Period   Screening  For  Cancers  Involving  Breast,  Colon,         Lungs  And  Other  Organs  As  Applicable,  In consultation   With  Your  Primary Care Providers.               *Second  Neurological  Opinion  And  Evaluations  In  [facility]  Setting  If  Patient  Is  Interested.                 * Please   Contact   Neurology  Clinic   Early   If   Are  Any  New  Neurological   And  Any neurological  Concerns.                   *  If  The  Patient remains  Neurologically  Stable   Return   To  [facility]  Department   IN      2        MONTHS  TIME   FOR  FURTHER              FOLLOW UP.                       *   The  Neurological   Findings,  Possible  Diagnosis,  Differential diagnoses                    And  Options  For    Further   Investigations  And  management   Are  Discussed  Comprehensively.                    Medications   And  Prescription   Risks  And  Side effects  Are   Also  Discussed.                     *  If   There is  Any  Significant  Worsening   Of  Current  Symptoms  And  Or                  If patient  Develops   Any additional  New  NeurologicalSymptoms                  Or  Significant  Concerns   Should  Call  911 or  Go  To  Emergency  Department                  For  Further  Immediate  Evaluation and  management .                         The   Above  Were  Reviewed  With  PATIENT   and                         questions  Answered  In  Detail.                                                              TOTAL   TIME     SPENT :                On this date   05/302024   I have spent    40  minutes   reviewing previous notes, test results               and face to face time with the patient including   discussing the diagnosis and importance of compliance               with the treatment plan  as well as documenting on the day of the visit.                             Electronically signed by   Luiz Iron,  M.D., [place].     Board Certified in  Neurology &  In  Brain Injury Medicine   American Board of Psychiatry and Neurology (ABPN)      DISCLAIMER:   Although every effort was made to ensure the accuracy of this  electronictranscription, some errors in transcription may have occurred.      GENERAL PATIENT INSTRUCTIONS:     A Healthy Lifestyle: Care Instructions  Your Care Instructions  A healthy lifestyle can help you feel good, stay at ahealthy weight, and have plenty of energy for both work and play. A healthy lifestyle is something you can share with your whole  family.  A healthy lifestyle also can lower your risk for serious health problems, such ashigh blood pressure, heart disease,  and diabetes.  You can follow a few steps listed below to improve your health and the health of your family.  Follow-up careis a key part of your treatment and safety. Be sure to make and go to all appointments, and call your doctor if you are having problems. It's also a good idea to know your test results and keep a list of the medicines you take.  How can you care for yourself at home?  Do not eat too much sugar, fat, or fast foods. You can still have dessert and treats nowand then. The goal is moderation.  Start small to improve your eating habits. Pay attention to portion sizes, drink less juice and soda pop, and eat more fruits and vegetables.  Eat a healthy amount of food. A 3-ounce serving of meat, for example, is about the size of a deck of cards. Fill the rest of your plate with vegetables and whole grains.  Limit theamount of soda and sports drinks you have every day. Drink more water when you are thirsty.  Eat at least 5 servings of fruits and vegetables every day. It may seem like a lot, but it is not hard to reach this goal. Aserving or helping is 1 piece of fruit, 1 cup of vegetables, or 2 cups of leafy, raw vegetables. Have an apple or some carrot sticks as an afternoon snack instead of a candy bar. Try to have fruits and/or vegetables at everymeal.  Make exercise part of your daily routine. You may want to start with simple activities, such as walking, bicycling, or slow swimming. Try tobe active 30 to 60 minutes every day. You do not need to do all 30 to 60 minutes all at once. For example, you can exercise 3 times a day for 10 or 20 minutes. Moderate exercise is safe for most people, but it is always agood idea to talk to your doctor before starting an exercise program.  Keep moving. Mow the lawn, work in the garden, or [employer]. Take the stairs instead of the  elevator at work.  If you smoke, quit. Peoplewho smoke have an increased risk for heart attack, stroke, cancer, and other lung illnesses. Quitting is hard, but there are ways to boost your chance of quitting tobacco for good.  Use nicotine gum, patches, or lozenges.  Ask your doctor about stop-smoking programs and medicines.  Keep trying.  In addition to reducing your risk of diseases in the future, you will notice some benefits soon after you stop using tobacco. If you have shortness of breath or asthma symptoms, they will likely getbetter within a few weeks after you quit.  Limit how much alcohol you drink. Moderate amounts of alcohol (up to 2 drinks a day for men, 1drink a day for women) are okay. But drinking too much can lead to liver problems, high blood pressure, and other health problems.  health  If you have a family, there are many things you can do together to improve your health.  Eat meals together as a family as often as possible.  Eat healthy foods. This includes fruits, vegetables, lean meats and dairy, and whole grains.  Include your family in your fitness plan. Most peoplethink of activities such as jogging or tennis as the way to fitness, but there are many ways you and your family can be more active. Anything that makes you breathe hard and gets your heart pumping is exercise. Here are sometips:  Walk to do errands or  to take your child to school or the bus.  Go for a family bike ride after dinner instead of watching TV.  Where can you learn more?  Go [email or web address] and sign in to your MyChart account. Enter [phone] in the Search HealthInformation box to learn more about "A Healthy Lifestyle: Care Instructions."     If you do not have anaccount, please click on the "Sign Up Now" link.  Current as of: June 20, 2015  Content Version: 11.2   2006-2017 [employer]. Care instructions adapted under license by [facility]. If you have questions about a medical  condition or this instruction, always ask your healthcare professional. [employer] disclaims any warranty or liability for your use of this information.

## 2023-04-24 NOTE — Patient Instructions (Addendum)
*   SEIZURE  PRECAUTIONS.                 A)  Avoid  Working  At   [employer].          B)  Avoid  Working  With  Heavy machinery.          C)  Avoid   Swimming,  Climbing  A  Ladder   Unattended.          D)  Avoid   Driving   If  You   Have  A  Seizure.          E)  Must   Be  Seizure  Free   For  At   Least   6 months,  Before   You  Can drive.                                                                                                                                    F) Some times  Your  May  Feel  Seizure coming  Before  It  Begins.  You  May feel             Strange smell or funny  Feeling  In  Your  Stomach,  Which is  Called   Aura.                     TIPS  TO  REDUCE/ PREVENT  SEIZURES         1.  Take  Your  Anti seizure  Medications   As   Recommended.       2. Get   Enough   Sleep.   Sleep  Deprivation   Can  Trigger  A  Seizure.       3. If   You   Have  A fever,  Treat  It  At  Once,  And  Contact   Your  Primary  Care Providers.                                                                                                                           4. Avoid   Alcohol.       5. Avoid  Flashing  Lights,  Loud  Noises and  TV  And  Video  Games,             As   These  May  Trigger   Your  Seizures       6.  Control  Your  Stress  And   Have  Adequate  Rest.       7.   If  You  Feel  A  Seizure  Coming   On :           A) warn people  Who  Are  With  You           B)  Make  Sure  There  Are  No  Sharp or  Hard  Objects  Around you.           C)  Lay down  On  Your  Side  And  Relax.                * FALL   PRECAUTIONS.          *   ADEQUATE   FLUID  INTAKE   AND  ELECTROLYTE  BALANCE           * KEEP  DAIRY  OF   THE  NEUROLOGICAL  SYMPTOMS          *  TO  MAINTAIN  REGULAR  SLEEP  WAKE  CYCLES.     *   TO  HAVE  ADEQUATE  REST  AND   SLEEP    HOURS.          *    AVOID  USAGE OF   TOBACCO,  EXCESSIVE  ALCOHOL                AND   ILLEGAL   SUBSTANCES,  IF  ANY          *  Maintain   Healthy  Life Style     With   Periodic  Monitoring  Of         Any  Medical  Conditions  Including   Elevated  Blood  Pressure,  Lipid  Profile,       Blood  Sugar levels  And   Heart  Disease.                *   Period   Screening  For  Cancers  Involving  Breast,  Colon,         Lungs  And  Other  Organs  As  Applicable,           In consultation   With  Your  Primary Care Providers.                *  If   There is  Any  Significant  Worsening   Of  Current  Symptoms  And             Or  If    Any additional  New  Neurological  Symptoms  and          Significant  Concerns   Should  Call  911 or  Go  To  Emergency  Department            For  Further  Immediate  Evaluation.

## 2023-04-28 ENCOUNTER — Inpatient Hospital Stay: Admit: 2023-04-28 | Payer: PRIVATE HEALTH INSURANCE | Attending: Neurology

## 2023-04-28 DIAGNOSIS — G40909 Epilepsy, unspecified, not intractable, without status epilepticus: Principal | ICD-10-CM

## 2023-04-28 NOTE — Procedures (Signed)
[facility]                [address] [place], [place] [address]                       ELECTROENCEPHALOGRAM REPORT      PATIENT NAME: Larry Koch, Larry Koch              DOB: Sep 13, 1981  MED REC NO: [id number]                         ROOM:   ACCOUNT NO: [id number]                       ADMIT DATE: 04/28/2023    PROVIDER: Luiz Iron, MD      PRIMARY CARE PHYSICIAN:  Ellene Route, NP    TECHNIQUE:  23 channels of EEG, 2 channels of EOG, 2 channel of EKG, and 1 channel ground and 1 channel reference were recorded with [employer] 32 channel system utilizing the International 10/20 system protocol.    Spike detection and seizure analysis protocols were utilized, and the study was reviewed using the comprehensive EEG monitoring.    This is an extended EEG recorded for 1 hour and 2 minutes.    CLINICAL DATA:      The patient is 42 years old with a history of bipolar disorder, seizure disorder, headaches, migraines, history of stroke or dizziness.  Previous history of abnormal EEG, anxiety.    The purpose of study is to evaluate for seizure activity.    MEDICATIONS:  Depakote, Imitrex, Lamictal, Seroquel, Effexor.    BACKGROUND:      While the patient was awake, the background activity consists of poor to fairly regulated 8 to 9 hertz waveforms seen over both cerebral hemispheres.    ACTIVATION PROCEDURES:      HYPERVENTILATION:  Hyperventilation was performed for 3 minutes. Patient was cooperative with good effort.  Also, post-hyperventilation period was monitored closely.      Hyperventilation:  Unremarkable.    PHOTIC STIMULATION:  Photic stimulation was performed at the following frequencies: 1 Hz, 3 Hz, 6 Hz, 9 Hz, 12 Hz, 15 Hz, 18 Hz, 21 Hz, 25 Hz, 30 Hz and patient tolerated well.    Photic stimulation:  No significant driving response noted.    EKG:  EKG showed normal sinus rhythm without any significant abnormality.    SLEEP STAGE:  1.      IMPRESSION:      No significant  focal, lateralized, or epileptiform features noted during the current recording.    Further clinical correlation and followup recommended.        Gypsy Decant, MD, FAAN     Board Certified in Neurology  & in  Brain Injury Medicine  American Board of Psychiatry and Neurology (ABPN).        D:  04/29/2023 10:21:06     T:  04/29/2023 10:54:59     PNP/AQS  Job #:  [id number]     Doc#:  [id number]

## 2023-04-28 NOTE — Progress Notes (Signed)
EXTENDED EEG Completed with [facility].    PCP: Mellissa Kohut, APRN - NP    Ordering: Renard Matter. Policherla, Neurologist    Interpreting Physician: Luiz Iron, Neurologist    Technician: Jerelene Redden, RN

## 2023-04-30 ENCOUNTER — Inpatient Hospital Stay: Payer: PRIVATE HEALTH INSURANCE

## 2023-04-30 DIAGNOSIS — G40909 Epilepsy, unspecified, not intractable, without status epilepticus: Secondary | ICD-10-CM

## 2023-04-30 LAB — CBC
Hematocrit: 44.2 % (ref 40.7–50.3)
Hemoglobin: 15.1 g/dL (ref 13.0–17.0)
MCH: 29.2 pg (ref 25.2–33.5)
MCHC: 34.2 g/dL — ABNORMAL HIGH (ref 25.2–33.5)
MCV: 85.5 fL (ref 82.6–102.9)
MPV: 10.1 fL (ref 8.1–13.5)
NRBC Automated: 0 per 100 WBC
Platelets: 252 10*3/uL (ref 138–453)
RBC: 5.17 m/uL (ref 4.21–5.77)
RDW: 12.6 % (ref 11.8–14.4)
WBC: 7 10*3/uL (ref 3.5–11.3)

## 2023-04-30 LAB — VALPROIC ACID LEVEL, TOTAL
Valproic Acid Lvl: 48 ug/mL — ABNORMAL LOW (ref 50–125)
Valproic Date last dose: 5052024
Valproic Dose amount: 500
Valproic Time last dose: 530

## 2023-04-30 LAB — ELECTROLYTE PANEL
Anion Gap: 9 mmol/L (ref 9–17)
CO2: 25 mmol/L (ref 20–31)
Chloride: 103 mmol/L (ref 98–107)
Potassium: 3.6 mmol/L — ABNORMAL LOW (ref 3.7–5.3)
Sodium: 137 mmol/L (ref 135–144)

## 2023-05-01 ENCOUNTER — Ambulatory Visit: Payer: PRIVATE HEALTH INSURANCE

## 2023-05-08 ENCOUNTER — Ambulatory Visit: Admit: 2023-05-08 | Discharge: 2023-05-08 | Payer: PRIVATE HEALTH INSURANCE | Attending: Neurology

## 2023-05-08 DIAGNOSIS — G40909 Epilepsy, unspecified, not intractable, without status epilepticus: Secondary | ICD-10-CM

## 2023-05-08 NOTE — Patient Instructions (Signed)
*   SEIZURE  PRECAUTIONS.                 A)  Avoid  Working  At   Heights.          B)  Avoid  Working  With  Heavy machinery.          C)  Avoid   Swimming,  Climbing  A  Ladder   Unattended.          D)  Avoid   Driving   If  You   Have  A  Seizure.          E)  Must   Be  Seizure  Free   For  At   Least   6 months,  Before   You  Can drive.                                                                                                                                    F) Some times  Your  May  Feel  Seizure coming  Before  It  Begins.  You  May feel             Strange smell or funny  Feeling  In  Your  Stomach,  Which is  Called   Aura.                     TIPS  TO  REDUCE/ PREVENT  SEIZURES         1.  Take  Your  Anti seizure  Medications   As   Recommended.       2. Get   Enough   Sleep.   Sleep  Deprivation   Can  Trigger  A  Seizure.       3. If   You   Have  A fever,  Treat  It  At  Once,  And  Contact   Your  Primary  Care Providers.                                                                                                                           4. Avoid   Alcohol.       5. Avoid  Flashing  Lights,  Loud  Noises and  TV  And  Video  Games,             As   These  May  Trigger   Your  Seizures       6.  Control  Your  Stress  And   Have  Adequate  Rest.       7.   If  You  Feel  A  Seizure  Coming   On :           A) warn people  Who  Are  With  You           B)  Make  Sure  There  Are  No  Sharp or  Hard  Objects  Around you.           C)  Lay down  On  Your  Side  And  Relax.             *   ADEQUATE   FLUID  INTAKE   AND  ELECTROLYTE  BALANCE             * KEEP  DAIRY  OF   THE  NEUROLOGICAL  SYMPTOMS          *  TO  MAINTAIN  REGULAR  SLEEP  WAKE  CYCLES.     *   TO  HAVE  ADEQUATE  REST  AND   SLEEP    HOURS.          *    AVOID  USAGE OF   TOBACCO,  EXCESSIVE  ALCOHOL                AND   ILLEGAL   SUBSTANCES,  IF  ANY          *  Maintain   Healthy  Life Style    With   Periodic  Monitoring   Of         Any  Medical  Conditions  Including   Elevated  Blood  Pressure,  Lipid  Profile,       Blood  Sugar levels  And   Heart  Disease.                *   Period   Screening  For  Cancers  Involving  Breast,  Colon,         Lungs  And  Other  Organs  As  Applicable,           In consultation   With  Your  Primary Care Providers.                *  If   There is  Any  Significant  Worsening   Of  Current  Symptoms  And             Or  If    Any additional  New  Neurological  Symptoms  and          Significant  Concerns   Should  Call  911 or  Go  To  Emergency  Department            For  Further  Immediate  Evaluation.

## 2023-05-08 NOTE — Progress Notes (Signed)
[facility]  Neurology    [address], [place]  [address]  Phone:[phone]   Fax: [phone]        SUBJECTIVE:       PATIENT ID:  Larry Koch is a  RIGHT    HANDED 42 y.o. male.      Migraine   This is a chronic problem. Episode onset: FOR   15  YEARS. The problem occurs intermittently. The problem has been waxing and waning. Associated symptoms include back pain, blurred vision, dizziness, insomnia, a loss of balance, neck pain, numbness, phonophobia, photophobia, tingling, a visual change and weakness. Pertinent negatives include no abdominal pain, abnormal behavior, anorexia, coughing, drainage, ear pain, eye pain, eye redness, eye watering, facial sweating, fever, hearing loss, muscle aches, nausea, rhinorrhea, scalp tenderness, seizures, sinus pressure, sore throat, swollen glands, tinnitus, vomiting or weight loss. The symptoms are aggravated by unknown. He has tried nothing for the symptoms. The treatment provided no relief.   Seizures  This is a chronic problem. Episode onset: SINCE    2015  The problem occurs intermittently (DURING     HIS  SLEEP  NIGHT  TIME  ). The problem has been waxing and waning. Associated symptoms include diaphoresis, neck pain, numbness, a visual change and weakness. Pertinent negatives include no abdominal pain, anorexia, arthralgias, change in bowel habit, chest pain, chills, congestion, coughing, fatigue, fever, headaches, joint swelling, myalgias, nausea, rash, sore throat, swollen glands, urinary symptoms, vertigo or vomiting. Associated symptoms comments: MEMORY   PROBLEMS  . Nothing aggravates the symptoms. He has tried nothing for the symptoms. The treatment provided no relief.   Cerebrovascular Accident  This is a chronic problem. Episode onset: SINCE   2017   The problem has been resolved. Associated symptoms include diaphoresis, neck pain, numbness, a visual change and weakness. Pertinent negatives include no abdominal pain,  anorexia, arthralgias, change in bowel habit, chest pain, chills, congestion, coughing, fatigue, fever, headaches, joint swelling, myalgias, nausea, rash, sore throat, swollen glands, urinary symptoms, vertigo or vomiting. Associated symptoms comments: RESIDUAL   LEFT   HEMIPARESIS  . Nothing aggravates the symptoms. He has tried nothing for the symptoms. The treatment provided no relief.             History obtained from  The   PATIENT         and other  available   medical  records   were  Also  reviewed.            The  Duration,  Quality,  Severity,  Location,  Timing,  Context,  Modifying  Factors   Of   The   Chief   Complaint       And  Present  Illness  Was   Reviewed   In   Chronological   Manner.                                            PATIENT'S  MAIN  CONCERNS INCLUDE :                       1)    KNOWN     H/O    MIGRAINES    AND  HEADACHES  FOR     20   YEARS                               ASSOCIATED     WITH   NAUSEA ,    VOMITING,  PHOTOPHOBIA                                                                                 2)          H/O    SEIZURES       DURING   NIGHT   TIME         SINCE                                          2015        AS    WITNESSED   BY    HIS   EX   WIFE                                3)        FAMILY     HISTORY    OF   SEIZURES                                         -   MATERNAL  SIDE                            4)     NO     PREVIOUS    H/O    HEAD INJURY.                                          5)        PREVIOUS     STROKE       IN    2017                                     WITH   HOSPITALIZATION    FOR  TWO  WEEKS                                  H/O     MILD   LEFT      RESIDUAL     HEMIPARESIS                                   -   PATIENT  ADVISED     TO   ASA  81   MG  PO  DAILY                          6)      PREVIOUS     H/O   NEUROLOGY    EVALUATIONS      AT                                  [facility]  [facility]                           BEING     FOLLOWED    BY    [facility]    AT   [facility]  [place]   [place]                            7)       PATIENT     DENIES      ANY    TOBACCO ,  ALCOHOL                                    OR     ILLEGAL   DRUG   USAGE                                 8)       H/O    CHRONIC    ANXIETY,   DEPRESSION ,  BIPOLAR  DISORDER                                                  FOR   20   YEARS                             -          ON    EFFEXOR,   TRAZODONE     SEROQUEL,   PRAZOSIN                                     BEING        FOLLOWED   WITH    MENT[facility]  [facility]ALTH  [facility]                                      AT    [place]                      9)       H/O    CHRONIC     MILD    MEMORY  PROBLEMS                                         -  STABLE                                    NEEDS  MONITORING                       10)   H/O    MILD     BALANCE  PROBLEMS     -   STABLE                       11)      H/O     CHRONIC    MILD  BRIEF    DIZZINESS                                            INTERMITTENTLY    -     ON  ASA   81   MG                                      -    STABLE                               12)      CO  MORBID  MULTIPLE  MEDICAL   CONDITION                                         TO  FOLLOW  WITH    HIS  PCP                                      13)       HAD    NEURO  DIAGNOSTIC  EVALUATIONS  IN   DEC  2021/  JAN. 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                              C)     LABS     INCLUDING    HYPER COAGULABLE   WORK                                     SHOWED       SHOWED    NO  SIGNIFICANT  ABNORMALITIES                             D)          EEG     SHOWED                                       mild-to-moderate irritable diffuse cerebral dysfunction                             E)     EMG / NC  STUDIES   SHOWED                                        SENSORY PERIPHERAL  POLYNEUROPATHY                                           MORE  SO   BOTH  LOWER   EXTREMITIES                            14)       H/O     INTOLERANCE   TO   NSAID     MEDICATION                         15)     PREVIOUS     H/O     WAKING  UP     EPISODES  ON  BATH   ROOM   FLOOR                                    AND    FURNITURE  IN    BED TIME    ROOM  IN         FEB.  /  MARCH     2022                    16)      PATIENT  DID  NOT   HAVE  NEUROLOGY  FOLLOW  UP   FROM                        MAY   2022      TO  NOV.  2023     AS    [place]  WAS  [place]                             PATIENT  RETURNED  BACK  TO  [place]                                 WITH    CONTINUED  MIGRAINES    AND  WAKING  EPISODES                           ON  FLOOR   ONCE  A  WEEK    AND  MEMORY PROBLEMS                         17)     PATIENT  HAD  NEURODIAGNOSTIC  EVALUATIONS                             A)   CT  HEAD   AND  CAROTID  DOPPLER    IN   DEC.  2023                                      SHOWED  NO  SIGNIFICANT  ABNORMALITIES  '                       B)   EEG   IN      JAN. 2024     ABNORMAL    REPORTED  AS                           "    Intermittent sharp waves, slow sharp waves noted during                                 post-hyperventilation period suggestive of underlying                               epileptogenic abnormality."                             C)  LABS    IN    DEC.  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                         19)      H/O   NON  COMPLIANCE   WITH  DEPAKOTE    COUPLE  OF  DAYS    A  WEEK                               WHICH INCREASES   RISK  FOR    SEIZURE  RECURRENCES                                    20)    HAD    FOLLOW  UP     NEURO  DIAGNOSTIC   EVALUATION    IN  JUNE  2024                               CT  HEAD,  EEG    SHOWED   NO  SIGNIFICANT  ABNORMALITIES                                 LABS     ARE   WITH IN NORMAL  LIMITS.                                 NO  RECURRENCE  OF   SEIZURE  ACTIVITY.                                  NO  NEW  NEUROLOGICAL  CONCERNS                      21)      PATIENT    REQUESTED   IN  MAY  2024     NEUROLOGICAL    CLEARANCE                               FOR   GI    PROCEDURES    AND   CURRENTLY    I  DO  NOT  SEE   ANY                             CONTRA  INDICATION   FOR   THE  SAME                            22)         RECOMMENDATIONS  :                                                        A)   SEIZURE  PRECAUTIONS  B)   SEIZURE  DAIRY                              C)   COMPLIANCE  WITH   ANTI EPILEPTIC MEDICATION   AND                                           MEDICAL  ADVICE                               D)    FALL PRECAUTIONS                                E)    TO  CONTINUE   DEPAKOTE    500   MG    PO   TID                                            FOR   MIGRAINE    AND   SEIZURE    MANAGEMENT                               F)     ASA    81   MG  PO    DAILY                                  G)     IMITREX  ,   UBRELVY       AS   NEEDED                                             FOR   MIGRAINES  HEADACHES                               H)    PERIODIC  LABS  INCLUDING   DEPAKOTE  LEVEL                              I)    FOLLOW  UP    WITH    PCP,   OTHER  CONSULTANTS                                           AND  [facility]  [facility]ESSION[facility]                                                                           --  REVIEWED     AND  DISCUSSED    WITH  PATIENT                                                                               23)         VARIOUS  RISK   FACTORS   WERE  REVIEWED   AND   DISCUSSED.                       PATIENT   HAS  MULTIPLE   MEDICAL, NEUROLOGICAL                        AND   MENTAL HEALTH   PROBLEMS .                    PATIENT'S   MANAGEMENT  IS  CHALLENGING.                                        PRECIPITATING  FACTORS: including  fever/infection, exertion/relaxation, position change, stress,                weather change,   medications/alcohol, time of day/darkness/light  Are  present                                                          MODIFYING  FACTORS:  fever/infection, exertion/relaxation, position change, stress, weather change,               medications/alcohol, time of day/darkness/light  Are  present                Patient   Indicates   The  Presence   And  The  Absence  Of  The  Following    Associated  And             Additional  Neurological    Symptoms:                                Balance  And coordination   problems  present           Gait problems     present            Headaches      present              Migraines           present           Memory problemspresent              Confusion        absent            Paresthesia   numbness          present  Seizures  And  Starring  Episodes           present           Syncope,  Near  syncopal episodes         absent           Speech   problems           absent             Swallowing   Problems      absent            Dizziness,  Light headedness           present              Vertigo        absent             Generalized   Weakness    absent              focal  Weakness     present             Tremors         absent              Sleep  Problems     present             History  Of   Recent  Head  Injury     absent             History  Of   Recent  TIA     absent             History  Of   Recent    Stroke     absent             Neck  Pain   and   Neck muscle  Spasms   present               Radiating  down   And   Weakness           present            Lower back   Pain  And     Spasms  present              Radiating    Down   And   Weakness          present                H/OFALLS        present               History  Of   Visual  Symptoms    absent                  Associated   Diplopia       absent                                               Also   Additional   Symptoms   Present    As  Documented    In   The   detailed                  Review  Of  Systems   And    Please  Refer   To    Them for   Additional    Information.                    Any components  That are either  Unobtainable  Or  Limited  In   HPI, ROS  And/or PFSH   Are                   Due   ToPatient's  Medical  Problems,  Clinical  Condition   and/or lack of                                 other    Alternate   resources.            RECORDS   REVIEWED:    historical medical records           INFORMATION   REVIEWED:     MEDICAL   HISTORY,SURGICAL   HISTORY,     MEDICATIONS   LIST,   ALLERGIES AND  DRUG  INTOLERANCES,       FAMILY   HISTORY,  SOCIAL  HISTORY,      PROBLEM  LIST   FOR  PATIENT  CARE   COORDINATION          Past Medical History:   Diagnosis Date    Anxiety     Cerebral artery occlusion with cerebral infarction (HCC) 2017    GERD (gastroesophageal reflux disease)     Glaucoma     Personality disorder (HCC)     PTSD (post-traumatic stress disorder)     PUD (peptic ulcer disease)     Seizures (HCC)          Past Surgical History:   Procedure Laterality Date    TONSILLECTOMY           Current Outpatient Medications   Medication Sig Dispense Refill    Sodium Fluoride 1.1 % PSTE BRUSH SMALL AMOUNT DENT AT BEDTIME TOOTH DECAY PREVENTION DO NOT RINSE AFTER USE. SPIT OUT EXCESS.      doxycycline hyclate (VIBRA-TABS) 100 MG tablet 1 tablet      butalbital-acetaminophen-caffeine (ESGIC) 50-325-40 MG per tablet ONE  TO  TWO  TABLETS  IN  THE  EVENING  AND  BED TIME  AS  NEEDED 60 tablet 1    divalproex  (DEPAKOTE) 500 MG DR tablet Take 1 tablet by mouth 3 times daily ONE  TABLET    3      TIMES  DAILY 90 tablet 5    SUMAtriptan (IMITREX) 100 MG tablet ONE   TABLET     TWICE  DAILY  AS  NEEDED   FOR  MIGRAINES 12 tablet 2    Ubrogepant (UBRELVY) 100 MG TABS Take 100 mg by mouth 2 times daily as needed (FOR  MIGRAINE) 30 tablet 2    psyllium (KONSYL) 28.3 % PACK Take 1 packet by mouth daily      lamoTRIgine (LAMICTAL) 100 MG tablet Take 1 tablet by mouth daily      valproic acid (DEPAKENE) 250 MG capsule Take 1 capsule by mouth 4 times daily      azithromycin (ZITHROMAX) 250 MG tablet Take 1 tablet by mouth daily      pantoprazole (PROTONIX) 40 MG tablet 1 tablet      vitamin D (CHOLECALCIFEROL) 25 MCG (1000 UT) TABS tablet 50 mcg  prazosin (MINIPRESS) 2 MG capsule 1 capsule      dicyclomine (BENTYL) 10 MG capsule Take 2 capsules by mouth 4 times daily (before meals and nightly)      colestipol (COLESTID) 1 g tablet Take 1 tablet by mouth 2 times daily      traZODone (DESYREL) 100 MG tablet 1 tablet      famotidine (PEPCID) 20 MG tablet TAKE TWO TABLETS BY MOUTH AT BEDTIME      cloNIDine (CATAPRES) 0.1 MG tablet       QUEtiapine (SEROQUEL) 25 MG tablet Take 8 tablets by mouth daily Taking 1/2 tablet daily.      venlafaxine (EFFEXOR XR) 37.5 MG extended release capsule TAKING 225MG  DAILY      Multiple Vitamins-Minerals (MULTIVITAMINS/MINERALS ADULT PO)       omeprazole (PRILOSEC) 20 MG delayed release capsule Take 1 capsule by mouth 2 times daily       No current facility-administered medications for this visit.         Allergies   Allergen Reactions    Blueberry Anaphylaxis    Penicillins Seizure     Per Pt induced Seizures as a child.    Other      blueberries, raspberries, blackberries. Vaccination reaction.    Sulfamethoxazole-Trimethoprim Nausea Only    Food Nausea And Vomiting     Blue cheese per Pt.         Family History   Problem Relation Age of Onset    Mental Illness Mother     Diabetes Mother      Diabetes Father     Heart Disease Father     Lung Cancer Father          Social History     Socioeconomic History    Marital status: Divorced     Spouse name: Not on file    Number of children: Not on file    Years of education: Not on file    Highest education level: Not on file   Occupational History    Not on file   Tobacco Use    Smoking status: Never    Smokeless tobacco: Never   Vaping Use    Vaping Use: Never used   Substance and Sexual Activity    Alcohol use: Not Currently     Comment: 1 drink per year    Drug use: No    Sexual activity: Yes     Partners: Female   Other Topics Concern    Not on file   Social History Narrative    Not on file     Social Determinants of Health     Financial Resource Strain: Not on file   Food Insecurity: Not on file   Transportation Needs: Not on file   Physical Activity: Not on file   Stress: Not on file   Social Connections: Not on file   Intimate Partner Violence: Not on file   Housing Stability: Not on file       Vitals:    05/08/23 1549   BP: 126/76   Pulse: 72   Resp: 16   SpO2: 96%         Wt Readings from Last 3 Encounters:   05/08/23 107.5 kg (237 lb)   04/24/23 107.5 kg (237 lb)   02/27/23 105.7 kg (233 lb)         BP Readings from Last 3 Encounters:   05/08/23 126/76   04/24/23 126/76   02/27/23 123/79  Hematology and Coagulation    Lab Results   Component Value Date/Time    WBC 7.0 04/30/2023 10:40 AM    RBC 5.17 04/30/2023 10:40 AM    HGB 15.1 04/30/2023 10:40 AM    HCT 44.2 04/30/2023 10:40 AM    MCV 85.5 04/30/2023 10:40 AM    MCH 29.2 04/30/2023 10:40 AM    MCHC 34.2 04/30/2023 10:40 AM    RDW 12.6 04/30/2023 10:40 AM    PLT 252 04/30/2023 10:40 AM    MPV 10.1 04/30/2023 10:40 AM         Chemistries    Lab Results   Component Value Date/Time    NA 137 04/30/2023 10:40 AM    K 3.6 04/30/2023 10:40 AM    CL 103 04/30/2023 10:40 AM    CO2 25 04/30/2023 10:40 AM    BUN 9 02/27/2023 08:20 PM    CREATININE 0.9 02/27/2023 08:20 PM    CALCIUM 8.5 02/27/2023  08:20 PM    BILITOT 0.2 06/22/2016 10:45 PM    ALKPHOS 74 06/22/2016 10:45 PM    AST 19 10/31/2022 12:22 PM    ALT 13 06/22/2016 10:45 PM     Lab Results   Component Value Date/Time    ALKPHOS 74 06/22/2016 10:45 PM    ALT 13 06/22/2016 10:45 PM    AST 19 10/31/2022 12:22 PM    BILITOT 0.2 06/22/2016 10:45 PM     Lab Results   Component Value Date/Time    BUN 9 02/27/2023 08:20 PM    CREATININE 0.9 02/27/2023 08:20 PM     Lab Results   Component Value Date/Time    CALCIUM 8.5 02/27/2023 08:20 PM    MG 2.0 02/27/2023 08:20 PM     Lab Results   Component Value Date/Time    AST 19 10/31/2022 12:22 PM    ALT 13 06/22/2016 10:45 PM         Review of Systems   Constitutional:  Positive for diaphoresis. Negative for appetite change, chills, fatigue, fever, unexpected weight change and weight loss.   HENT:  Negative for congestion, dental problem, drooling, ear discharge, ear pain, facial swelling, hearing loss, mouth sores, nosebleeds, postnasal drip, rhinorrhea, sinus pressure, sore throat, tinnitus, trouble swallowing and voice change.    Eyes:  Positive for blurred vision and photophobia. Negative for pain, discharge, redness, itching and visual disturbance.   Respiratory:  Negative for apnea, cough, choking, chest tightness, shortness of breath and wheezing.    Cardiovascular:  Negative for chest pain, palpitations and leg swelling.   Gastrointestinal:  Negative for abdominal distention, abdominal pain, anorexia, blood in stool, change in bowel habit, constipation, diarrhea, nausea and vomiting.   Endocrine: Negative for cold intolerance, heat intolerance, polydipsia, polyphagia and polyuria.   Musculoskeletal:  Positive for back pain, gait problem and neck pain. Negative for arthralgias, joint swelling, myalgias and neck stiffness.   Skin:  Negative for color change, pallor, rash and wound.   Allergic/Immunologic: Negative for environmental allergies, food allergies and immunocompromised state.   Neurological:   Positive for dizziness, tingling, weakness, light-headedness, numbness and loss of balance. Negative for vertigo, tremors, seizures, syncope, facial asymmetry, speech difficulty and headaches.   Hematological:  Negative for adenopathy. Does not bruise/bleed easily.   Psychiatric/Behavioral:  Positive for decreased concentration and sleep disturbance. Negative for agitation, behavioral problems, confusion, dysphoric mood, hallucinations, self-injury and suicidal ideas. The patient is nervous/anxious and has insomnia. The patient is not hyperactive.  OBJECTIVE:      Physical Exam  Constitutional:       Appearance: He is well-developed.   HENT:      Head: Normocephalic and atraumatic. No raccoon eyes or Battle's sign.      Right Ear: External ear normal.      Left Ear: External ear normal.      Nose: Nose normal.   Eyes:      Conjunctiva/sclera: Conjunctivae normal.      Pupils: Pupils are equal, round, and reactive to light.   Neck:      Thyroid: No thyroid mass or thyromegaly.      Vascular: No carotid bruit.      Trachea: No tracheal deviation.      Meningeal: Brudzinski's sign and Kernig's sign absent.   Cardiovascular:      Rate and Rhythm: Normal rate and regular rhythm.   Pulmonary:      Effort: Pulmonary effort is normal.   Musculoskeletal:         General: No tenderness.      Cervical back: Normal range of motion and neck supple. No rigidity. No muscular tenderness. Normal range of motion.   Skin:     General: Skin is warm.      Coloration: Skin is not pale.      Findings: No erythema or rash.      Nails: There is no clubbing.   Psychiatric:         Attention and Perception: He is attentive.         Mood and Affect: Mood is not anxious or depressed. Affect is not labile, blunt or inappropriate.         Speech: He is communicative. Speech is not rapid and pressured, delayed, slurred or tangential.         Behavior: Behavior is not agitated, slowed, aggressive, withdrawn, hyperactive or combative.  Behavior is cooperative.         Thought Content: Thought content is not paranoid or delusional. Thought content does not include homicidal or suicidal ideation. Thought content does not include homicidal or suicidal plan.         Cognition and Memory: Memory is impaired. He exhibits impaired recent memory. He does not exhibit impaired remote memory.         Judgment: Judgment is not impulsive or inappropriate.               NEUROLOGICALEXAMINATION :      A) MENTAL STATUS:                   Alert and  oriented  To time, place  And  Person.                  No Aphasia.  No  Dysarthria.                    Able   To  Follow     SIMPLE    commands   without   Any  Difficulty.                   No right  To left confusion.                  Normal  Speech  And language function.                   Insight and  Judgment ,Fund  Of  Knowledge  within normal limits                Recent  And  Remote memory    DECREASED                Attention &  Concentration are        DECREASED                                            B) CRANIAL NERVES :        CN : Visual  Acuity  And  Visual fields  within normal limits               Fundi  Could  Not  Be  Could  Not  Be  Evaluated.           3,4,6 CN : Both  Pupils are   Reactive and  Equal.  Movements  Are  Intact.                             No  Nystagmus.  No  INO.  No  Afferent  Pupillary  Defect noted.          5 CN :  Normal  Facial sensations and Corneal  Reflexes           7 CN:  Normal  Facial  Symmetry  And  Strength.  No facial  Weakness.           8 CN :  Hearing  Appears within normal limits          9, 10 CN: Normal   spontaneous, reflex   palate   movements         11 CN:   Normal  Shoulder  shrug and  strength         12 CN :   Normal  Tongue movements and  Tongue  In midline                        No tongue   Fasciculations or atrophy         C) MOTOR  EXAM:                   Strength  In upper  And  Lower   extremities     DECREASED                          MILD    RESIDUAL  LEFT  HEMIPARESIS                             DECREASED   HAND  GRIPS                   No  Drift.     No  Atrophy               Rapid   alternating  And  repetitions  Movements   DECREASED                 Muscle  Tone  In upper  And  Lower  Extremities  normal  No rigidity.  No  Spasticity.                 Bradykinesia   absent                 No  Asterixis.              Sustention  Tremor , Resting   Tremor   absent                    No   other  Abnormal  Movements noted           D) SENSORY :               Light   touch, pinprick,   position  And  Vibration    DECREASED      E) REFLEXES:                   Deep  Tendon  Reflexes    DECREASED                                              F) COORDINATION  AND  GAIT :                                Station and  Gait    SLOW     WITH  MILD  LIMP                           Romberg 's test    POSITIVE                            Ataxia negative          ASSESSMENT:        Patient Active Problem List   Diagnosis    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)    Bipolar II disorder, most recent episode major depressive (HCC)    Borderline personality disorder (HCC)    Seizure disorder (HCC)    Headache    Insomnia    Low back pain    Lumbar spondylosis    Migraine without aura and without status migrainosus, not intractable    Obstructive sleep apnea syndrome in adult    Pituitary cyst (HCC)    Restless legs    Sleep apnea    H/O: stroke    Chronic tension-type headache, intractable    Memory problem    Dizziness    Eye pain    Abnormal EEG    Chronic cerebral ischemia    Middle cerebral artery stenosis, right    Parasomnia    Allergic reaction to drug    Adjustment disorder with depressed mood    Adjustment disorder with disturbance of conduct    Stress and adjustment reaction    Anxiety disorder    Panic disorder (episodic paroxysmal anxiety)    Generalized anxiety disorder    Other reactions to severe stress    Other  specific personality disorders (HCC)    Panic disorder    Personality disorder (HCC)    Ataxia    Atypical chest pain    Chest wall contusion, right, initial encounter  Chronic post-traumatic stress disorder (PTSD)    Post-traumatic stress disorder, chronic    Constipation    Contusion of left forearm    Contusion of left leg    Dental caries    Dysphagia, unspecified    Dysphagia, pharyngoesophageal phase    Gastro-esophageal reflux disease without esophagitis    Gastroesophageal reflux disease    Heartburn    Hemorrhoids without complication    Hypertension    Localization-related (focal) (partial) symptomatic epilepsy and epileptic syndromes with complex partial seizures, intractable, without status epilepticus (HCC)    Major depressive disorder, recurrent, moderate (HCC)    Major depressive disorder, recurrent episode, mild (HCC)    Moderate recurrent major depression (HCC)    Major depressive disorder, recurrent, severe with psychotic symptoms (HCC)    Major depressive disorder, single episode, unspecified    Migraine, unspecified, not intractable, without status migrainosus    Migraine    Myopia    Nocturia    Open wound of face    Pain in joint, pelvic region and thigh    Pain in throat    Prostatitis    Rash and other nonspecific skin eruption    Regular astigmatism    RLQ abdominal pain    Snoring    Strain of left shoulder    Stress fracture    Tinnitus    Unspecified visual field defects    Unspecified visual loss    Urinary frequency    Urinary urgency    Vitamin D deficiency    Vomiting    Weight gain    Chronic neck pain       MRI OF THE BRAIN WITHOUT CONTRAST  10/23/2020 8:32 am       TECHNIQUE:   Multiplanar multisequence MRI of the brain was performed without the   administration of intravenous contrast.       COMPARISON:   None.       HISTORY:   ORDERING SYSTEM PROVIDED HISTORY: Bipolar II disorder, most recent episode   major depressive (HCC)   TECHNOLOGIST PROVIDED HISTORY:   migraine        Initial evaluation.       FINDINGS:   INTRACRANIAL STRUCTURES/VENTRICLES: There is no acute infarct. No mass effect   or midline shift. No evidence of an acute intracranial hemorrhage.  The   ventricles and sulci are normal in size and configuration.  The   sellar/suprasellar regions appear unremarkable.  The normal signal voids   within the major intracranial vessels appear maintained.       ORBITS: The visualized portion of the orbits demonstrate no acute abnormality.       SINUSES: Mucous retention cysts seen in the left maxillary sinus.  The   mastoid air cells demonstrate no acute abnormality.       BONES/SOFT TISSUES: The bone marrow signal intensity appears normal. The soft   tissues demonstrate no acute abnormality.           Impression   No acute intracranial abnormality.  No acute infarct.               VISITING DIAGNOSIS:      ICD-10-CM    1. Seizure disorder (HCC)  G40.909       2. Dizziness  R42       3. Chronic neck pain  M54.2     G89.29       4. 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 Bipolar  II disorder, most recent episode major depressive (HCC)  F31.81       6. H/O: stroke  Z104.73       7. Obstructive sleep apnea syndrome in adult  G47.33       8. Memory problem  R41.3       9. Primary insomnia  F51.01       10. Borderline personality disorder (HCC)  F60.3       11. Other sleep apnea  G47.39       12. Migraine without aura and without status migrainosus, not intractable  G43.009       13. Chronic tension-type headache, intractable  G44.221       14. Parasomnia, unspecified type  G47.50       15. Bipolar II disorder (HCC)  F31.81                    CONCERNS   &   INCREASED   RISK   FOR        * TIA,  CEREBRO  VASCULAR  ISCHEMIA, STROKE     *   DIZZINESS,   VERTEBROBASILAR  INSUFFICIENCY ,         *  SEIZURE  ACTIVITY,  EPILEPSY ,        *   COMPLICATED  MIGRAINES      *    CHRONIC  TENSION  HEADACHES      *   COGNITIVE  &   MEMORY PROBLEMS        *  MONONEUROPATHIES,   RADICULOPATHY       *    PERIPHERAL  NEUROPATHY,       *  GAIT  DIFFICULTIES  &   BALANCE PROBLMES   AND  FALL                VARIOUS  RISK   FACTORS   WERE  REVIEWED   AND   DISCUSSED.          *  PATIENT   HAS  MULTIPLE   MEDICAL, NEUROLOGICAL                        AND   MENTAL HEALTH   PROBLEMS .           PATIENT'S   MANAGEMENT  IS  CHALLENGING.            PLAN:                         Ashby Dawes  Of  The  Diagnoses,  The  Management & Treatment  Options            AND    Care  plan  Were          Reviewed and   Discussed   With  patient.           * Goals  And  Expectations  Of  The  Therapy  Discussed   And  Reviewed.          *   Benefits   And   Side  Effect  Profile  Of  Medication/s   Were   Discussed                * Need   For  Further   Follow up For  The  Various  Problems Were  discussed.          *  Results  Of  The  Previous  Diagnostic tests were reviewed and  discussed                   Medical  Decision  Making  Was  Made  Based on the   Complexity  Of  Above  Mentioned  Diagnoses,    Data reviewed             And    Risk  Of  Significant   Co morbidities and   complicating   Factors.                 Medical  Decision  Was     High    Complexity   Due   To  The  Patient's  Multiple  Symptoms  &  Disease,            Complex  Treatment  Regimen,  Multiple medications           and   Risk  Of   Side  Effects,  Difficulty  In  Medication  Management  And  Diagnostic  Challenges           In  View  Of  The  Associated   Co  Morbid  Conditions   And  Problems.                           * FALL   PRECAUTIONS.      THESE  REVIEWED   AND  DISCUSSED      *  USE   WALKING  ASSISTANCE  DEVICES     QUAD  CANE        *   BE  CAREFUL  WITH  ACTIVITIES           *   AVOID   NECK  AND/ BACK  STRAINING  ACTIVITIES            *   ADEQUATE   FLUID  INTAKE   AND  ELECTROLYTE  BALANCE           * KEEP  DAIRY  OF   THE  NEUROLOGICAL  SYMPTOMS        RECORDING THE    DURATION  AND  FREQUENCY.          *  AVOID    CONDITIONS  AND  FACTORS   THAT   MAKE                  NEUROLOGICAL  SYMPTOMS  WORSE.        *   SEIZURE  PRECAUTIONS.  -  REVIEWED                 A)  Avoid  Working  At   [employer].          B)  Avoid  Working  With  Engineer, manufacturing systems.          C)  Avoid   Swimming,  Climbing  A  Ladder   Unattended.          D)  Avoid   Driving   If  You   Have  A  Seizure.          E)  Must   Be  Seizure  Free   For  At   Least   6 months,  Before   You  Can  drive.          F) Some times  Your  May  Feel  Seizure coming  Before  It  Begins.                You  May feelsmell or funny  Feeling  In  Your  Stomach,  Which is  Called   Aura.                     TIPS  TO  REDUCE/ PREVENT  SEIZURES           1.  Take  Your  Anti seizure  Medications   As   Recommended.       2.  Get   Enough   Sleep.   Sleep  Deprivation   Can  Trigger  A  Seizure.       3.  If   You   Have  A fever,  Treat  It  At  Once,  And  Contact   Your  Primary  Care Providers.                                                                                                                           4. Avoid   Alcohol.       5. Avoid  Flashing  Lights,  Loud  Noises and  TV  And  Video  Games,             As   These  May  Trigger   Your  Seizures       6.  Control  Your  Stress  And   Have  Adequate  Rest.       7.   If  You  Feel  A  Seizure  Coming On :             A) warn people  Who  Are  With  You             B)  Make  Sure  There  Are  No  Sharp or  Hard  Objects  Around you.             C)  Lay down  On  Your  Side  And  Relax.                  *TO  MAINTAIN  REGULAR  SLEEP  WAKE  CYCLES.     *   TO  HAVE  ADEQUATE  REST  AND   SLEEP    HOURS.            *    AVOID  ANY USAGE OF    TOBACCO,          AVOID  EXCESSIVE  ALCOHOL  AND   ILLEGAL   SUBSTANCES          *  CONTINUE  MEDICATIONS    PRESCRIBED       AS    RECOMMENDED       *   Compliance   With  Medications   And  Instructions            *    MIGRAINE/ HEADACHE    DAIRY   WITH  MONITORING                       OF  DURATION  AND   FREQUENCY.        *  May   Use  Pill  Box,    If  Needed        *  MEDICATIONS TO AVOID:    WELLBUTRIN,  ULTRAM            *    Antiplatelet  therapy    As   Recommended  Was   Discussed      *    Prophylactic  Use   Of     Vitamin   B   Complex,  Folic  Acid,    Vitamin  B12    Multivitamin,       Calcium  With  magnesium  And  Vit D    Supplementations   Over  The  Counter  Discussed               *  PATIENT  IS  ALSO   COUNSELED   TO  KEEP    ACTIVITIES:         A)   SIMPLE      B)  ORGANIZED      C)  WRITEDOWN                     *  EVALUATIONS  AND  FOLLOW UP:   PERIODICALLY                  * PHYSICAL  THERAPY                                 *CARDIOLOGY              *   OPTHALMOLOGY                               *   RECOMMENDATIONS  :                                                        A)   SEIZURE  PRECAUTIONS                             B)   SEIZURE  DAIRY                              C)   COMPLIANCE  WITH   ANTI EPILEPTIC MEDICATION   AND  MEDICAL  ADVICE                               D)    FALL PRECAUTIONS                                E)    TO  CONTINUE   DEPAKOTE    500   MG    PO   TID                                            FOR   MIGRAINE    AND   SEIZURE    MANAGEMENT                               F)     ASA    81   MG  PO    DAILY                                  G)     IMITREX  ,   UBRELVY       AS   NEEDED                                             FOR   MIGRAINES  HEADACHES                               H)    PERIODIC  LABS  INCLUDING   DEPAKOTE  LEVEL                              I)    FOLLOW  UP    WITH    PCP,   OTHER  CONSULTANTS                                           AND  MENTAL  HEALTH PROFESSIONALS                             J)     FOLLOW   UP    NEURO  DIAGNOSTIC   EVALUATIONS                                         CLEARANCE  FOR  SURGERY  AS  REQUESTED                                                          --  REVIEWED     AND   DISCUSSED    WITH  PATIENT                                                                      *PATIENT   TO  FOLLOW  UP  WITH   PRIMARY  CARE         OTHER  CONSULTANTS  AS  BEFORE.               *TO  FOLLOW  WITH   MENTAL  HEALTH  PROFESSIONALS ,  INCLUDING            PSYCHOLOGICAL  COUNSELING   AND  PSYCHIATRIC  EVALUATIONS                    --    REVIEWED     AND  DISCUSSED    WITH  PATIENT                         *  Maintain   Healthy  Life Style    With   Periodic  Monitoring  Of          Any  Medical  Conditions  Including   Elevated  Blood  Pressure,  Lipid  Profile,        Blood  Sugar levels  AndHeart  Disease.                *   Period   Screening  For  Cancers  Involving  Breast,  Colon,         Lungs  And  Other  Organs  As  Applicable,  In consultation   With  Your  Primary Care Providers.               *Second  Neurological  Opinion  And  Evaluations  In  [facility]  Setting  If  Patient  Is  Interested.                 * Please   Contact   Neurology  Clinic   Early   If   Are  Any  New  Neurological   And  Any neurological  Concerns.                   *  If  The  Patient remains  Neurologically  Stable   Return   To  [facility] Neurology Department   IN      2        MONTHS  TIME   FOR  FURTHER              FOLLOW UP.                       *   The  Neurological   Findings,  Possible  Diagnosis,  Differential diagnoses                    And  Options  For    Further  Investigations                   And  management   Are  Discussed  Comprehensively.                    Medications   And  Prescription   Risks  And  Side effects  Are   Also  Discussed.                     *  If   There is  Any  Significant  Worsening   Of  Current  Symptoms  And  Or                  If patient  Develops   Any additional  New  NeurologicalSymptoms                  Or  Significant  Concerns   Should  Call  911 or  Go  To  Emergency  Department                  For  Further  Immediate   Evaluation and  management .                         The   Above  Were  Reviewed  With  PATIENT   and                         questions  Answered  In  Detail.                                                              TOTAL   TIME     SPENT :                On this date   05/302024   I have spent    40  minutes   reviewing previous notes, test results               and face to face time with the patient including   discussing the diagnosis and importance of compliance               with the treatment plan  as well as documenting on the day of the visit.                             Electronically signed by   Luiz Iron,  M.D., [place].     Board Certified in  Neurology &  In  Brain Injury Medicine   American Board of Psychiatry and Neurology (ABPN)      DISCLAIMER:   Although every effort was made to ensure the accuracy of this  electronictranscription, some errors in transcription may have occurred.      GENERAL PATIENT INSTRUCTIONS:     A Healthy Lifestyle: Care Instructions  Your Care Instructions  A healthy lifestyle can help you feel good, stay at ahealthy weight, and have plenty of energy for both work and play. A healthy lifestyle is something you can share with your whole family.  A healthy lifestyle also can lower your risk for serious health problems, such ashigh blood pressure, heart disease, and diabetes.  You can follow a few steps listed below to improve your health and the health of your family.  Follow-up careis a key part of your treatment and safety. Be sure to make and go to all appointments, and call your doctor if you are having problems. It's also a good idea to know your test results and keep a list of the medicines you take.  How can you care for yourself at home?  Do not eat too much sugar, fat, or fast foods. You can still have dessert and treats nowand then. The goal is moderation.  Start small to improve your eating habits. Pay attention to portion sizes, drink less juice and  soda pop, and eat more fruits and vegetables.  Eat a healthy amount of food. A 3-ounce serving of meat, for example, is about the size of a deck of cards. Fill the rest of your plate with vegetables and whole grains.  Limit theamount of soda and sports drinks you have every day. Drink more water when you are thirsty.  Eat at least 5 servings of fruits and vegetables every day. It may seem like a lot, but it is not hard to reach this goal. Aserving or helping is 1 piece of fruit, 1 cup of vegetables, or 2 cups of leafy, raw vegetables. Have an apple or some carrot sticks as an afternoon snack instead of a candy bar. Try to have fruits and/or vegetables at everymeal.  Make exercise part of your daily routine. You may want to start with simple activities, such as walking, bicycling, or slow swimming. Try tobe active 30 to 60 minutes every day. You do not need to do all 30 to 60 minutes all at once. For example, you can exercise 3 times a day for 10 or 20 minutes. Moderate exercise is safe for most people, but it is always agood idea to talk to your doctor before starting an exercise program.  Keep moving. Mow the lawn, work in the garden, or [employer]. Take the stairs instead of the elevator at work.  If you smoke, quit. Peoplewho smoke have an increased risk for heart attack, stroke, cancer, and other lung illnesses. Quitting is hard, but there are ways to boost your chance of quitting tobacco for good.  Use nicotine gum, patches, or lozenges.  Ask your doctor about stop-smoking programs and medicines.  Keep trying.  In addition to reducing your risk of diseases in the future, you will notice some benefits soon after you stop using tobacco. If you have shortness of breath or asthma symptoms, they will likely getbetter within a few weeks after you quit.  Limit how much alcohol you drink. Moderate amounts of alcohol (up to 2 drinks a day for men, 1drink a day for women) are okay. But drinking too much can lead to  liver problems, high blood pressure, and other health problems.  health  If you have a family, there are many things you can do together to improve your health.  Eat meals together as a family as often as possible.  Eat healthy foods. This includes fruits, vegetables, lean meats and dairy, and whole grains.  Include your family in your fitness plan. Most peoplethink of activities such as jogging or tennis as the way to fitness, but there are many ways you and your family can be more active. Anything that makes  you breathe hard and gets your heart pumping is exercise. Here are sometips:  Walk to do errands or to take your child to school or the bus.  Go for a family bike ride after dinner instead of watching TV.  Where can you learn more?  Go [email or web address] and sign in to your MyChart account. Enter [phone] in the Search HealthInformation box to learn more about "A Healthy Lifestyle: Care Instructions."     If you do not have anaccount, please click on the "Sign Up Now" link.  Current as of: June 20, 2015  Content Version: 11.2   2006-2017 [employer]. Care instructions adapted under license by [facility]. If you have questions about a medical condition or this instruction, always ask your healthcare professional. [employer] disclaims any warranty or liability for your use of this information.

## 2023-05-22 ENCOUNTER — Inpatient Hospital Stay: Payer: 59 | Attending: Gastroenterology

## 2023-05-22 ENCOUNTER — Ambulatory Visit: Payer: PRIVATE HEALTH INSURANCE | Attending: Neurology

## 2023-05-22 MED ORDER — DIVALPROEX SODIUM 500 MG PO TBEC
500 | Freq: Once | ORAL | Status: AC
Start: 2023-05-22 — End: 2023-05-22
  Administered 2023-05-22: 17:00:00 500 mg via ORAL

## 2023-05-22 MED ORDER — PROPOFOL 200 MG/20ML IV EMUL
200 | INTRAVENOUS | Status: AC
Start: 2023-05-22 — End: ?

## 2023-05-22 MED ORDER — LACTATED RINGERS IV SOLN
INTRAVENOUS | Status: DC
Start: 2023-05-22 — End: 2023-05-22
  Administered 2023-05-22: 16:00:00 via INTRAVENOUS

## 2023-05-22 MED ORDER — LIDOCAINE HCL (PF) 2 % IJ SOLN
2 | INTRAMUSCULAR | Status: AC
Start: 2023-05-22 — End: ?

## 2023-05-22 MED ORDER — PROPOFOL 200 MG/20ML IV EMUL
200 MG/20ML | INTRAVENOUS | Status: DC | PRN
  Administered 2023-05-22 (×7): 50 via INTRAVENOUS

## 2023-05-22 MED ORDER — SODIUM CHLORIDE 0.9 % IV SOLN
0.9 | INTRAVENOUS | Status: DC
Start: 2023-05-22 — End: 2023-05-22

## 2023-05-22 MED ORDER — LIDOCAINE HCL (PF) 1 % IJ SOLN
1 | Freq: Once | INTRAMUSCULAR | Status: DC | PRN
Start: 2023-05-22 — End: 2023-05-22

## 2023-05-22 MED ORDER — LIDOCAINE HCL (PF) 2 % IJ SOLN
2 % | INTRAMUSCULAR | Status: DC | PRN
  Administered 2023-05-22: 18:00:00 100 via INTRAVENOUS

## 2023-05-22 MED FILL — DIPRIVAN 200 MG/20ML IV EMUL: 200 MG/20ML | INTRAVENOUS | Qty: 20

## 2023-05-22 MED FILL — DIVALPROEX SODIUM 500 MG PO TBEC: 500 MG | ORAL | Qty: 1

## 2023-05-22 MED FILL — XYLOCAINE-MPF 2 % IJ SOLN: 2 % | INTRAMUSCULAR | Qty: 5

## 2023-05-22 NOTE — Op Note (Signed)
[facility]  EGD and COLONOSCOPY    Patient: Larry Koch   Date:  05/22/2023   DOB: 1981/10/28         Med Rec#: [id number]     Procedure:  EGD with Biopsy and Colonoscopy with biopsy    Indication: GERD, diarrhea, rectal bleeding    Findings   Mild ringing and longitudinal furrowing of entire soft  Schatzki's ring at GEJ with irregular Z-line.  Biopsied.  Erosive gastritis, antral.  Biopsied  Erosive duodenitis.  Biopsied.    Small internal hemorrhoids  Minimal sigmoid diverticulosis  Normal terminal ileum, biopsied  Visualized colonic mucosa was normal.  Biopsied.  Suboptimal prep throughout with poor prep in cecum and ascending colon.  Significant lesions may have been missed due to retained stool and undigested food.    Recommendations  Await pathology.  Repeat colonoscopy in 5 years.  Strongly consider virtual colonoscopy or air-contrast barium enema  Patient will need aggressive proton pump inhibitor therapy  Repeat EGD after 2 months of aggressive PPI therapy to evaluate for esophageal healing    Appropriate Photos Taken: Yes  Specimen(s) Removed: As stated above  Withdrawal Time: 11 minutes  Estimated Blood Loss: Minimal  Anesthesia:  MAC    Complications:    Sub-optimal bowel prep, poor proximal  Description of Procedure:  Informed consent was obtained after explanation of the procedure including indications, description of the procedure,  benefits and possible risks and complications of the procedure, and alternatives.  All questions were answered.  The patient's history was reviewed and a directed physical examination was performed prior to the procedure.  The patient was monitored throughout the procedure with pulse oximetry and periodic assessment of vital signs.  After adequate sedation was achieved, the scope was inserted into the mouth and advanced to  the second portion of the duodenum.  As the scope was withdrawn, the anatomy and the appearance of the mucosa was noted.  A digital  rectal examination was then performed.  The video endoscope was placed in the patient's rectum and advanced without difficulty  to the terminal ileum.  The ileocecal valve and appendiceal orifice were identified.  The prep was poor, proximally but adequate elsewhere.  Ther examination of the mucosa and the anatomy was performed during both introduction and withdrawal of the endoscope.       Lenon Curt M.D.  05/22/2023 at 12:45 PM

## 2023-05-22 NOTE — Discharge Instructions (Addendum)
[facility]    POST-ENDOSCOPY INSTRUCTIONS    1. ACTIVITY     No driving, operating machinery, or making important decisions for 24 hours.      Resume normal activity after 24 hours.  You may return to work after 24 hours.    2. DIET      Resume your usual diet unless specified.                Diet Modification: none    3. MEDICATIONS       (Do not consume alcohol, tranquilizers, or sleeping medications for 24 hours unless advised by your physician)                 Resume your usual medications.    4. PHYSICIAN FOLLOW-UP                 Please call the office, at [phone], for:   pathology results / appointment / instructions.                  See your primary care physician as planned.      You will need your next colonoscopy in  5 years.      5. NORMAL CHANGES YOU MAY EXPERIENCE AFTER ENDOSCOPY:       UPPER ENDOSCOPY (EGD or ERCP):  Sore throat.        COLONOSCOPY:  Passing of gas for several hours after. Bloating and mild abdominal cramping.           6. CALL YOUR PHYSICIAN IF YOU EXPERIENCE ANY OF THE FOLLOWING      A.  Passing blood rectally or vomiting blood (color may be red or black)      B.  Severe abdominal pain or tenderness (that is not relieved by passing air)      C.  Fever, chills, or excessive sweating      D.  Persistent nausea or vomiting      E.  Redness or swelling at the IV site    If you have additional questions, PLEASE call your doctor or the [facility] [phone])        Recommendations  Await pathology.  Repeat colonoscopy in 5 years.  Strongly consider virtual colonoscopy or air-contrast barium enema  Patient will need aggressive proton pump inhibitor therapy  Repeat EGD after 2 months of aggressive PPI therapy to evaluate for esophageal healing

## 2023-05-22 NOTE — Anesthesia Post-Procedure Evaluation (Signed)
Department of Anesthesiology  Postprocedure Note    Patient: Larry Koch  MRN: [id number]  Birthdate: 11-27-1980  Date of evaluation: 05/22/2023    Procedure Summary       Date: 05/22/23 Room / Location: [facility]    Anesthesia Start: 1329 Anesthesia Stop: 1408    Procedures:       ESOPHAGOGASTRODUODENOSCOPY BIOPSY      COLONOSCOPY BIOPSY Diagnosis:       Gastroesophageal reflux disease, unspecified whether esophagitis present      Diarrhea, unspecified type      (Gastroesophageal reflux disease, unspecified whether esophagitis present [K21.9])      (Diarrhea, unspecified type [R19.7])    Surgeons: Jaci Standard, MD Responsible Provider: Rene Kocher, MD    Anesthesia Type: MAC, general ASA Status: 3            Anesthesia Type: No value filed.    Aldrete Phase I:      Aldrete Phase II: Aldrete Score: 10    Anesthesia Post Evaluation    Patient location during evaluation: PACU  Patient participation: complete - patient participated  Level of consciousness: awake and alert  Airway patency: patent  Nausea & Vomiting: no nausea and no vomiting  Cardiovascular status: hemodynamically stable  Respiratory status: acceptable  Hydration status: euvolemic  Pain management: adequate    No notable events documented.

## 2023-05-22 NOTE — Anesthesia Pre-Procedure Evaluation (Signed)
Department of Anesthesiology  Preprocedure Note       Name:  Larry Koch   Age:  42 y.o.  DOB:  03-Apr-1981                                          MRN:  [id number]         Date:  05/22/2023      Surgeon: Moishe Spice):  Jacquenette Shone Tyrone Apple, MD    Procedure: Procedure(s):  ESOPHAGOGASTRODUODENOSCOPY  COLONOSCOPY DIAGNOSTIC    Medications prior to admission:   Prior to Admission medications    Medication Sig Start Date End Date Taking? Authorizing Provider   Sodium Fluoride 1.1 % PSTE BRUSH SMALL AMOUNT DENT AT BEDTIME TOOTH DECAY PREVENTION DO NOT RINSE AFTER USE. SPIT OUT EXCESS. 04/11/23   [provider]   doxycycline hyclate (VIBRA-TABS) 100 MG tablet 1 tablet  Patient not taking: Reported on 05/22/2023 04/11/23   [provider]   butalbital-acetaminophen-caffeine (ESGIC) 50-325-40 MG per tablet ONE  TO  TWO  TABLETS  IN  THE  EVENING  AND  BED TIME  AS  NEEDED 04/24/23   Policherla, Renard Matter, MD   divalproex (DEPAKOTE) 500 MG DR tablet Take 1 tablet by mouth 3 times daily ONE  TABLET    3      TIMES  DAILY 04/24/23   Policherla, Renard Matter, MD   SUMAtriptan (IMITREX) 100 MG tablet ONE   TABLET     TWICE  DAILY  AS  NEEDED   FOR  MIGRAINES 04/24/23   Policherla, Renard Matter, MD   Ubrogepant (UBRELVY) 100 MG TABS Take 100 mg by mouth 2 times daily as needed (FOR  MIGRAINE) 04/24/23   Policherla, Renard Matter, MD   psyllium (KONSYL) 28.3 % PACK Take 1 packet by mouth daily  Patient not taking: Reported on 05/22/2023    [provider]   lamoTRIgine (LAMICTAL) 100 MG tablet Take 1 tablet by mouth daily  Patient not taking: Reported on 05/22/2023    [provider]   valproic acid (DEPAKENE) 250 MG capsule Take 1 capsule by mouth 4 times daily  Patient not taking: Reported on 05/22/2023    [provider]   azithromycin (ZITHROMAX) 250 MG tablet Take 1 tablet by mouth daily  Patient not taking: Reported on 05/22/2023    [provider]   pantoprazole (PROTONIX) 40 MG tablet 1  tablet  Patient not taking: Reported on 05/22/2023 03/28/22   [provider]   vitamin D (CHOLECALCIFEROL) 25 MCG (1000 UT) TABS tablet 50 mcg 07/11/22   [provider]   prazosin (MINIPRESS) 2 MG capsule 1 capsule 10/09/22   [provider]   dicyclomine (BENTYL) 10 MG capsule Take 2 capsules by mouth 4 times daily (before meals and nightly) 12/07/21   [provider]   colestipol (COLESTID) 1 g tablet Take 1 tablet by mouth 2 times daily 08/13/22   [provider]   traZODone (DESYREL) 100 MG tablet 1 tablet 10/09/22   [provider]   famotidine (PEPCID) 20 MG tablet TAKE TWO TABLETS BY MOUTH AT BEDTIME 08/13/22   [provider]   cloNIDine (CATAPRES) 0.1 MG tablet  09/07/20   [provider]   QUEtiapine (SEROQUEL) 25 MG tablet Take 16 tablets by mouth daily 09/05/20   [provider]   venlafaxine (EFFEXOR XR)  37.5 MG extended release capsule TAKING 225MG  DAILY 09/05/20   [provider]   Multiple Vitamins-Minerals (MULTIVITAMINS/MINERALS ADULT PO)  03/17/20   [provider]   omeprazole (PRILOSEC) 20 MG delayed release capsule Take 1 capsule by mouth 2 times daily  Patient not taking: Reported on 05/22/2023    [provider]       Current medications:    Current Facility-Administered Medications   Medication Dose Route Frequency Provider Last Rate Last Admin    lidocaine PF 1 % injection 1 mL  1 mL IntraDERmal Once PRN Nunzio Cobbs, MD        0.9 % sodium chloride infusion   IntraVENous Continuous Nunzio Cobbs, MD        lactated ringers IV soln infusion   IntraVENous Continuous Nunzio Cobbs, MD 125 mL/hr at 05/22/23 1219 New Bag at 05/22/23 1219    divalproex (DEPAKOTE SPRINKLE) DR capsule 500 mg  500 mg Oral Once Roxan Hockey, Zairah Arista W, DO           Allergies:    Allergies   Allergen Reactions    Blueberry Anaphylaxis    Penicillins Seizure     Per Pt induced Seizures as a child.    Other      blueberries,  raspberries, blackberries. Vaccination reaction.    Sulfamethoxazole-Trimethoprim Nausea Only    Food Nausea And Vomiting     Blue cheese per Pt.       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    Restless legs G25.81    Sleep apnea G47.30    H/O: stroke Z86.73    Chronic tension-type headache, intractable G44.221    Memory problem R41.3    Dizziness R42    Eye pain H57.10    Abnormal EEG R94.01    Chronic cerebral ischemia I67.82    Middle cerebral artery stenosis, 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    Parasomnia G47.50    Allergic reaction to drug T78.40XA    Adjustment disorder with depressed mood F43.21    Adjustment disorder with disturbance of conduct F43.24    Stress and adjustment reaction F43.29    Anxiety disorder F41.9    Panic disorder (episodic paroxysmal anxiety) F41.0    Generalized anxiety disorder F41.1    Other reactions to severe stress F43.89    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    Panic disorder F41.0    Personality disorder (HCC) F60.9    Ataxia R27.0    Atypical chest pain R07.89    Chest wall contusion, right, initial encounter S20.211A    Chronic post-traumatic stress disorder (PTSD) F43.12    Post-traumatic stress disorder, chronic F43.12    Constipation K59.00    Contusion of left forearm S50.12XA    Contusion of left leg S80.12XA    Dental caries K02.9    Dysphagia, unspecified R13.10    Dysphagia, pharyngoesophageal phase R13.14  Gastro-esophageal reflux disease without esophagitis  K21.9    Gastroesophageal reflux disease K21.9    Heartburn R12    Hemorrhoids without complication K64.9    Hypertension I10    Localization-related (focal) (partial) symptomatic epilepsy and epileptic syndromes with complex partial seizures, intractable, 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    Major depressive disorder, recurrent, moderate (HCC) F33.1    Major depressive disorder, recurrent episode, mild (HCC) F33.0    Moderate recurrent major depression (HCC) F33.1    Major depressive disorder, recurrent, severe with psychotic symptoms (HCC) F33.3    Major depressive disorder, single episode, unspecified F32.9    Migraine, unspecified, not intractable, without status migrainosus G43.909    Migraine G43.909    Myopia H52.10    Nocturia R35.1    Open wound of face S01.80XA    Pain in joint, pelvic region and thigh M25.559    Pain in throat R07.0    Prostatitis N41.9    Rash and other nonspecific skin eruption R21    Regular astigmatism H52.229    RLQ abdominal pain R10.31    Snoring R06.83    Strain of left shoulder S46.912A    Stress fracture M84.30XA    Tinnitus H93.19    Unspecified visual field defects H53.40    Unspecified visual loss H54.7    Urinary frequency R35.0    Urinary urgency R39.15    Vitamin D deficiency E55.9    Vomiting R11.10    Weight gain R63.5    Chronic neck pain M54.2, G89.29       Past Medical History:        Diagnosis Date    Anxiety     Cerebral artery occlusion with cerebral infarction (HCC) 2017    Weakness L. side    GERD (gastroesophageal reflux disease)     Glaucoma     Hyperlipidemia     Hypertension     Personality disorder (HCC)     Prolonged emergence from general anesthesia     PTSD (post-traumatic stress disorder)     PUD (peptic ulcer disease)     Seizures (HCC) 11/25/2022       Past Surgical History:        Procedure Laterality Date    COLONOSCOPY      TONSILLECTOMY         Social History:    Social History     Tobacco Use    Smoking status: Never    Smokeless tobacco:  Never   Substance Use Topics    Alcohol use: Not Currently     Comment: 1 drink per year                                Counseling given: Not Answered      Vital Signs (Current):   Vitals:    05/22/23 1140 05/22/23 1150   BP: 124/75    Pulse: 71    Temp: 98.6 F (37 C)    SpO2: 94%    Weight:  105.2 kg (232 lb)   Height:  1.829 m (6')                                              BP Readings from Last 3 Encounters:   05/22/23 124/75   05/08/23 126/76   04/24/23  126/76       NPO Status: Time of last liquid consumption: 0200                        Time of last solid consumption: 0900                        Date of last liquid consumption: 05/22/23                        Date of last solid food consumption: 05/21/23    BMI:   Wt Readings from Last 3 Encounters:   05/22/23 105.2 kg (232 lb)   05/08/23 107.5 kg (237 lb)   04/24/23 107.5 kg (237 lb)     Body mass index is 31.46 kg/m.    CBC:   Lab Results   Component Value Date/Time    WBC 7.0 04/30/2023 10:40 AM    RBC 5.17 04/30/2023 10:40 AM    HGB 15.1 04/30/2023 10:40 AM    HCT 44.2 04/30/2023 10:40 AM    MCV 85.5 04/30/2023 10:40 AM    RDW 12.6 04/30/2023 10:40 AM    PLT 252 04/30/2023 10:40 AM       CMP:   Lab Results   Component Value Date/Time    NA 137 04/30/2023 10:40 AM    K 3.6 04/30/2023 10:40 AM    CL 103 04/30/2023 10:40 AM    CO2 25 04/30/2023 10:40 AM    BUN 9 02/27/2023 08:20 PM    CREATININE 0.9 02/27/2023 08:20 PM    GFRAA >60 12/15/2020 05:15 PM    LABGLOM >90 02/27/2023 08:20 PM    GLUCOSE 100 02/27/2023 08:20 PM    CALCIUM 8.5 02/27/2023 08:20 PM    BILITOT 0.2 06/22/2016 10:45 PM    ALKPHOS 74 06/22/2016 10:45 PM    AST 19 10/31/2022 12:22 PM    ALT 13 06/22/2016 10:45 PM       POC Tests: No results for input(s): "POCGLU", "POCNA", "POCK", "POCCL", "POCBUN", "POCHEMO", "POCHCT" in the last 72 hours.    Coags:   Lab Results   Component Value Date/Time    PROTIME 11.0 11/02/2020 12:46 PM    INR 1.0 11/02/2020 12:46 PM    APTT 26.7 11/02/2020 12:46  PM    APTT 33.7 06/22/2016 10:45 PM       HCG (If Applicable): No results found for: "PREGTESTUR", "PREGSERUM", "HCG", "HCGQUANT"     ABGs: No results found for: "PHART", "PO2ART", "PCO2ART", "HCO3ART", "BEART", "O2SATART"     Type & Screen (If Applicable):  No results found for: "LABABO"    Drug/Infectious Status (If Applicable):  No results found for: "HIV", "HEPCAB"    COVID-19 Screening (If Applicable): No results found for: "COVID19"        Anesthesia Evaluation  Patient summary reviewed and Nursing notes reviewed   no history of anesthetic complications:   Airway: Mallampati: III  TM distance: >3 FB   Neck ROM: full  Mouth opening: > = 3 FB   Dental:          Pulmonary:   (+)     sleep apnea: on CPAP,                                  Cardiovascular:  Exercise tolerance: good (>4 METS)  (+) hypertension:    (-)  angina        Rate: normal                    Neuro/Psych:   (+) seizures:, CVA:, headaches:, psychiatric history:            GI/Hepatic/Renal:   (+) GERD:, PUD          Endo/Other:                     Abdominal:             Vascular:          Other Findings:             Anesthesia Plan      MAC and general     ASA 3       Induction: intravenous.    MIPS: prophylactic pharmacologic antiemetic agents not administered perioperatively for documented reasons.  Anesthetic plan and risks discussed with patient.      Plan discussed with CRNA.                Cleared by neuro   Last seizure 4-5 months ago per pt  On depakote 500mg  tid has not taken today  Dose given in preop         Erma Pinto, DO   05/22/2023

## 2023-05-22 NOTE — H&P (Signed)
Interval H&P Note    Pt Name: Larry Koch  MRN: [id number]  Birthdate: 27-Jul-1981  Date of evaluation: 05/22/2023      [x]  I have reviewed in epic the Neurology Progress not eby Dr Dellia Beckwith dated 04/24/23 attached below for the Interval History and Physical note.     [x]  I have examined  Larry Koch, a 42 y.o. male with past Cerebral infarction (2017) GERD, Glaucoma, PTSD Field seismologist), PUD and seizure disorder managed by PCP and specialists who arrived for his scheduled ESOPHAGOGASTRODUODENOSCOPY, COLONOSCOPY DIAGNOSTIC by Jaci Standard, MD for Gastroesophageal reflux disease, unspecified whether esophagitis present; *.  Patient followed bowel prep until watery clear. Previous colonoscopies in [place] 3-4 years ago and "I had to be admitted and didn't wake up postop for 36 hours. Had to admit me at the hospitalized." Doesn't known what happened  FH Paternal GF and Paternal Uncle at age 16 and is still alive both with colon cancer. Patient denies but does have some difficulty swallowing food that intermittently gets stuck  No bowel changes, bloody tarry stools, diarrhea alternating with constipation, abdominal pain or unintentional weight loss. The patient denies new health changes, fever, chills, wheezing, cough, increased SOB, chest pain, open sores or wounds.No DM      Hx CVA 2017 with residual weakness on left side. Continues to have balance issue and can get dizzy and lightheaded.      Vital Signs: BP 124/75   Pulse 71   Temp 98.6 F (37 C)   Ht 1.829 m (6')   Wt 105.2 kg (232 lb)   SpO2 94%   BMI 31.46 kg/m     Allergies:  Blueberry, Penicillins, Other, Sulfamethoxazole-trimethoprim, and Food    Medications:    Prior to Admission medications    Medication Sig Start Date End Date Taking? Authorizing Provider   Sodium Fluoride 1.1 % PSTE BRUSH SMALL AMOUNT DENT AT BEDTIME TOOTH DECAY PREVENTION DO NOT RINSE AFTER USE. SPIT OUT EXCESS. 04/11/23   [provider]   doxycycline hyclate  (VIBRA-TABS) 100 MG tablet 1 tablet  Patient not taking: Reported on 05/22/2023 04/11/23   [provider]   butalbital-acetaminophen-caffeine (ESGIC) 50-325-40 MG per tablet ONE  TO  TWO  TABLETS  IN  THE  EVENING  AND  BED TIME  AS  NEEDED 04/24/23   Policherla, Renard Matter, MD   divalproex (DEPAKOTE) 500 MG DR tablet Take 1 tablet by mouth 3 times daily ONE  TABLET    3      TIMES  DAILY 04/24/23   Policherla, Renard Matter, MD   SUMAtriptan (IMITREX) 100 MG tablet ONE   TABLET     TWICE  DAILY  AS  NEEDED   FOR  MIGRAINES 04/24/23   Policherla, Renard Matter, MD   Ubrogepant (UBRELVY) 100 MG TABS Take 100 mg by mouth 2 times daily as needed (FOR  MIGRAINE) 04/24/23   Policherla, Renard Matter, MD   psyllium (KONSYL) 28.3 % PACK Take 1 packet by mouth daily  Patient not taking: Reported on 05/22/2023    [provider]   lamoTRIgine (LAMICTAL) 100 MG tablet Take 1 tablet by mouth daily  Patient not taking: Reported on 05/22/2023    [provider]   valproic acid (DEPAKENE) 250 MG capsule Take 1 capsule by mouth 4 times daily  Patient not taking: Reported on 05/22/2023    [provider]   azithromycin (ZITHROMAX) 250 MG tablet Take 1 tablet by mouth daily  Patient not taking: Reported on 05/22/2023    [provider]   pantoprazole (PROTONIX) 40 MG tablet 1 tablet  Patient not taking: Reported on 05/22/2023 03/28/22   [provider]   vitamin D (CHOLECALCIFEROL) 25 MCG (1000 UT) TABS tablet 50 mcg 07/11/22   [provider]   prazosin (MINIPRESS) 2 MG capsule 1 capsule 10/09/22   [provider]   dicyclomine (BENTYL) 10 MG capsule Take 2 capsules by mouth 4 times daily (before meals and nightly) 12/07/21   [provider]   colestipol (COLESTID) 1 g tablet Take 1 tablet by mouth 2 times daily 08/13/22   [provider]   traZODone (DESYREL) 100 MG tablet 1 tablet 10/09/22   [provider]   famotidine (PEPCID) 20 MG tablet TAKE TWO TABLETS BY  MOUTH AT BEDTIME 08/13/22   [provider]   cloNIDine (CATAPRES) 0.1 MG tablet  09/07/20   [provider]   QUEtiapine (SEROQUEL) 25 MG tablet Take 16 tablets by mouth daily 09/05/20   [provider]   venlafaxine (EFFEXOR XR) 37.5 MG extended release capsule TAKING 225MG  DAILY 09/05/20   [provider]   Multiple Vitamins-Minerals (MULTIVITAMINS/MINERALS ADULT PO)  03/17/20   [provider]   omeprazole (PRILOSEC) 20 MG delayed release capsule Take 1 capsule by mouth 2 times daily  Patient not taking: Reported on 05/22/2023    [provider]         This is a 42 y.o. male who is pleasant, cooperative, alert and oriented x3, in no acute distress.    Heart: Heart sounds are normal.  HR 71 regular rate and rhythm without murmur, gallop or rub.   Lungs: SpO2 94% Normal respiratory effort with equal expansion, good air exchange, unlabored without wheezes or rales bilaterally   Abdomen: soft, obese, nontender, nondistended with bowel sounds .     Labs:  Recent Labs     04/30/23  1040   HGB 15.1   HCT 44.2   WBC 7.0   MCV 85.5   PLT 252   NA 137   K 3.6*   CL 103   CO2 25       No results for input(s): "COVID19" in the last 720 hours.    Cindy Fullman, Adron Bene, APRN - CNP  Electronically signed 05/22/2023 at 12:39 PM            [facility]  Neurology    [address] [place]  [address]  [phone]   Fax: [phone]        SUBJECTIVE:       PATIENT ID:  Larry Koch is a  RIGHT    HANDED 42 y.o. male.      Migraine   This is a chronic problem. Episode onset: FOR   15  YEARS. The problem occurs intermittently. The problem has been waxing and waning. Associated symptoms include back pain, blurred vision, dizziness, insomnia, a loss of balance, neck pain, numbness, phonophobia, photophobia, tingling, a visual change and weakness. Pertinent negatives include no abdominal pain, abnormal behavior, anorexia, coughing, drainage, ear pain, eye  pain, eye redness, eye watering, facial sweating, fever, hearing loss, muscle aches, nausea, rhinorrhea, scalp tenderness, seizures, sinus pressure, sore throat, swollen glands, tinnitus, vomiting or weight loss. The symptoms are aggravated by unknown. He has tried nothing for the symptoms. The treatment provided no relief.   Seizures  This is a chronic problem. Episode onset:  SINCE    2015  The problem occurs intermittently (DURING HIS SLEEP  NIGHT  TIME  ). The problem has been waxing and waning. Associated symptoms include diaphoresis, neck pain, numbness, a visual change and weakness. Pertinent negatives include no abdominal pain, anorexia, arthralgias, change in bowel habit, chest pain, chills, congestion, coughing, fatigue, fever, headaches, joint swelling, myalgias, nausea, rash, sore throat, swollen glands, urinary symptoms, vertigo or vomiting. Associated symptoms comments: MEMORY   PROBLEMS  . Nothing aggravates the symptoms. He has tried nothing for the symptoms. The treatment provided no relief.   Cerebrovascular Accident  This is a chronic problem. Episode onset: SINCE   2017   The problem has been resolved. Associated symptoms include diaphoresis, neck pain, numbness, a visual change and weakness. Pertinent negatives include no abdominal pain, anorexia, arthralgias, change in bowel habit, chest pain, chills, congestion, coughing, fatigue, fever, headaches, joint swelling, myalgias, nausea, rash, sore throat, swollen glands, urinary symptoms, vertigo or vomiting. Associated symptoms comments: RESIDUAL   LEFT   HEMIPARESIS  . Nothing aggravates the symptoms. He has tried nothing for the symptoms. The treatment provided no relief.             History obtained from  The   PATIENT         and other  available   medical  records   were  Also  reviewed.            The  Duration,  Quality,  Severity,  Location,  Timing,  Context,  Modifying  Factors   Of   The   Chief   Complaint       And  Present  Illness   Was   Reviewed   In   Chronological   [place].                                            PATIENT'S  MAIN  CONCERNS INCLUDE :                       1)    KNOWN     H/O    MIGRAINES    AND  HEADACHES                                          FOR     20   YEARS                               ASSOCIATED     WITH   NAUSEA ,    VOMITING,  PHOTOPHOBIA                                                                                 2)          H/O    SEIZURES       DURING   NIGHT   TIME  SINCE                                          2015        AS    WITNESSED   BY    HIS   EX   WIFE                                3)        FAMILY     HISTORY    OF   SEIZURES                                         -   MATERNAL  SIDE                            4)     NO     PREVIOUS    H/O    HEAD INJURY.                                          5)        PREVIOUS     STROKE       IN    2017                                     WITH   HOSPITALIZATION    FOR  TWO  WEEKS                                  H/O     MILD   LEFT      RESIDUAL     HEMIPARESIS                                   -   PATIENT  ADVISED     TO   ASA   81   MG  PO  DAILY                          6)      PREVIOUS     H/O   NEUROLOGY    EVALUATIONS      AT                                  [facility]                          BEING     FOLLOWED    BY    [facility]    AT   [facility]  [place]   IN  7)       PATIENT     DENIES      ANY    TOBACCO ,  ALCOHOL                                    OR     ILLEGAL   DRUG   USAGE                                 8)       H/O    CHRONIC    ANXIETY,   DEPRESSION ,  BIPOLAR  DISORDER                                                  FOR   20   YEARS                             -          ON    EFFEXOR,   TRAZODONE     SEROQUEL,   PRAZOSIN                                     BEING        FOLLOWED   WITH    MENTAL  HEALTH  PROFESSIONALS                                      AT    [place]                      9)       H/O     CHRONIC     MILD    MEMORY  PROBLEMS                                         -    STABLE                                    NEEDS  MONITORING                       10)   H/O    MILD     BALANCE  PROBLEMS     -   STABLE                       11)      H/O     CHRONIC    MILD  BRIEF    DIZZINESS                                            INTERMITTENTLY    -  ON  ASA   81   MG                                      -    STABLE                               12)      CO  MORBID  MULTIPLE  MEDICAL   CONDITION                                         TO  FOLLOW  WITH    HIS  PCP                                      13)       HAD    NEURO  DIAGNOSTIC  EVALUATIONS  IN   DEC  2021/  JAN. 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                              C)     LABS     INCLUDING    HYPER COAGULABLE   WORK                                     SHOWED       SHOWED    NO  SIGNIFICANT  ABNORMALITIES                             D)         EEG     SHOWED  mild-to-moderate irritable diffuse cerebral dysfunction                             E)     EMG / NC  STUDIES   SHOWED                                        SENSORY PERIPHERAL  POLYNEUROPATHY                                           MORE  SO   BOTH  LOWER   EXTREMITIES                            14)       H/O     INTOLERANCE   TO   NSAID     MEDICATION                         15)     PREVIOUS     H/O     WAKING  UP     EPISODES  ON  BATH   ROOM   FLOOR                                    AND    FURNITURE  IN    BED TIME    ROOM  IN         FEB.  /  MARCH     2022                    16)      PATIENT   DID  NOT   HAVE  NEUROLOGY  FOLLOW  UP   FROM                        MAY   2022      TO  NOV.  2023     AS    [place]                             PATIENT  RETURNED  BACK  TO  [place]                                 WITH    CONTINUED  MIGRAINES    AND  WAKING  EPISODES                           ON  FLOOR   ONCE  A  WEEK    AND  MEMORY PROBLEMS                         17)     PATIENT  HAD  NEURODIAGNOSTIC  EVALUATIONS  A)   CT  HEAD   AND  CAROTID  DOPPLER    IN   DEC.  2023                                      SHOWED  NO  SIGNIFICANT  ABNORMALITIES  '                       B)   EEG   IN      JAN. 2024     ABNORMAL    REPORTED  AS                           "    Intermittent sharp waves, slow sharp waves noted during                                 post-hyperventilation period suggestive of underlying                               epileptogenic abnormality."                             C)  LABS    IN    DEC.  2023     ARE  WITH IN NORMAL  LIMITS                                  EXCEPT  FOR  NON  DETECTABLE  VALPROIC  ACID   LEVEL                     18)       H/O    SEIZURE   RECURRENCE   IN  FEB.  2024     WITH  WAKING  UP                                   ON     FLOOR .                         19)      H/O   NON COMPLIANCE   WITH  DEPAKOTE    COUPLE  OF  DAYS    A  WEEK                               WHICH INCREASES   RISK  FOR    SEIZURE  RECURRENCES                             20)      PATIENT    REQUESTED   IN  MAY  2024     NEUROLOGICAL    CLEARANCE                               FOR   GI  PROCEDURES                       21)         RECOMMENDATIONS  :                                                        A)   SEIZURE  PRECAUTIONS                             B)   SEIZURE  DAIRY                              C)   COMPLIANCE  WITH   ANTI EPILEPTIC MEDICATION   AND                                           MEDICAL  ADVICE                               D)    FALL PRECAUTIONS                                 E)    TO  CONTINUE   DEPAKOTE    500   MG    PO   TID                                            FOR   MIGRAINE    AND   SEIZURE    MANAGEMENT                               F)     ASA    81   MG  PO    DAILY                                  G)     IMITREX  ,   UBRELVY       AS   NEEDED                                             FOR   MIGRAINES  HEADACHES                               H)    PERIODIC  LABS  INCLUDING   DEPAKOTE  LEVEL                              I)    FOLLOW  UP    WITH    PCP,   OTHER  CONSULTANTS                                           AND  MENT[facility]  HEALTH PROFESSIONALS                             J)     FOLLOW   UP    NEURO  DIAGNOSTIC   EVALUATIONS                                         CLEARANCE  FOR  SURGERY  AS  REQUESTED                                                    --   REVIEWED     AND  DISCUSSED    WITH  PATIENT                                                            22)         VARIOUS  RISK   FACTORS   WERE  REVIEWED   AND   DISCUSSED.                       PATIENT   HAS  MULTIPLE   MEDICAL, NEUROLOGICAL                        AND   MENTAL HEALTH   PROBLEMS .                    PATIENT'S   MANAGEMENT  IS  CHALLENGING.                  PRECIPITATING  FACTORS: including  fever/infection, exertion/relaxation, position change, stress,                weather change,   medications/alcohol, time of day/darkness/light  Are  present                                                          MODIFYING  FACTORS:  fever/infection, exertion/relaxation, position change, stress, weather change,               medications/alcohol, time of day/darkness/light  Are  present                Patient   Indicates   The  Presence   And  The  Absence  Of  The  Following    Associated  And  Additional  Neurological    Symptoms:                                Balance  And coordination   problems  present           Gait problems     present            Headaches       present              Migraines           present           Memory problemspresent              Confusion        absent            Paresthesia   numbness          present           Seizures  And  Starring  Episodes           present           Syncope,  Near  syncopal episodes         absent           Speech   problems           absent             Swallowing   Problems      absent            Dizziness,  Light headedness           present              Vertigo        absent             Generalized   Weakness    absent              focal  Weakness     present             Tremors         absent              Sleep  Problems     present             History  Of   Recent  Head  Injury     absent             History  Of   Recent  TIA     absent             History  Of   Recent    Stroke     absent             Neck  Pain   and   Neck muscle  Spasms  present               Radiating  down   And   Weakness           present            Lower back   Pain  And     Spasms  present              Radiating    Down   And   Weakness          present  H/OFALLS        present               History  Of   Visual  Symptoms    absent                  Associated   Diplopia       absent                                               Also   Additional   Symptoms   Present    As  Documented    In   The   detailed                  Review  Of  Systems   And    Please   Refer   To    Them for   Additional    Information.                    Any components  That are either  Unobtainable  Or  Limited  In   HPI, ROS  And/or PFSH   Are                   Due   ToPatient's  Medical  Problems,  Clinical  Condition   and/or lack of                                 other    Alternate   resources.            RECORDS   REVIEWED:    historical medical records           INFORMATION   REVIEWED:     MEDICAL   HISTORY,SURGICAL   HISTORY,     MEDICATIONS   LIST,   ALLERGIES AND  DRUG  INTOLERANCES,       FAMILY   HISTORY,  SOCIAL  HISTORY,      PROBLEM  LIST    FOR  PATIENT  CARE   COORDINATION          Past Medical History:   Diagnosis Date    Anxiety     Cerebral artery occlusion with cerebral infarction (HCC) 2017    GERD (gastroesophageal reflux disease)     Glaucoma     Personality disorder (HCC)     PTSD (post-traumatic stress disorder)     PUD (peptic ulcer disease)     Seizures (HCC)          Past Surgical History:   Procedure Laterality Date    TONSILLECTOMY           Current Outpatient Medications   Medication Sig Dispense Refill    Sodium Fluoride 1.1 % PSTE BRUSH SMALL AMOUNT DENT AT BEDTIME TOOTH DECAY PREVENTION DO NOT RINSE AFTER USE. SPIT OUT EXCESS.      doxycycline hyclate (VIBRA-TABS) 100 MG tablet 1 tablet      psyllium (KONSYL) 28.3 % PACK Take 1 packet by mouth daily      lamoTRIgine (LAMICTAL) 100 MG tablet Take 1 tablet by mouth daily      valproic acid (DEPAKENE) 250 MG capsule Take 1 capsule by mouth 4 times daily  azithromycin (ZITHROMAX) 250 MG tablet Take 1 tablet by mouth daily      pantoprazole (PROTONIX) 40 MG tablet 1 tablet      vitamin D (CHOLECALCIFEROL) 25 MCG (1000 UT) TABS tablet 50 mcg      prazosin (MINIPRESS) 2 MG capsule 1 capsule      dicyclomine (BENTYL) 10 MG capsule Take 2 capsules by mouth 4 times daily (before meals and nightly)      colestipol (COLESTID) 1 g tablet Take 1 tablet by mouth 2 times daily      traZODone (DESYREL) 100 MG tablet 1 tablet      famotidine (PEPCID) 20 MG tablet TAKE TWO TABLETS BY MOUTH AT BEDTIME      divalproex (DEPAKOTE) 500 MG DR tablet Take 1 tablet by mouth 3 times daily ONE  TABLET    3      TIMES  DAILY 90 tablet 5    SUMAtriptan (IMITREX) 100 MG tablet ONE   TABLET     TWICE  DAILY  AS  NEEDED   FOR  MIGRAINES 12 tablet 2    Ubrogepant (UBRELVY) 100 MG TABS Take 100 mg by mouth 2 times daily as needed (FOR  MIGRAINE) 30 tablet 2    butalbital-acetaminophen-caffeine (ESGIC) 50-325-40 MG per tablet ONE  TO  TWO  TABLETS  IN  THE  EVENING  AND  BED TIME  AS  NEEDED 60 tablet 1     cloNIDine (CATAPRES) 0.1 MG tablet       QUEtiapine (SEROQUEL) 25 MG tablet Take 8 tablets by mouth daily Taking 1/2 tablet daily.      venlafaxine (EFFEXOR XR) 37.5 MG extended release capsule TAKING 225MG  DAILY      Multiple Vitamins-Minerals (MULTIVITAMINS/MINERALS ADULT PO)       omeprazole (PRILOSEC) 20 MG delayed release capsule Take 1 capsule by mouth 2 times daily       No current facility-administered medications for this visit.         Allergies   Allergen Reactions    Blueberry Anaphylaxis    Penicillins Seizure     Per Pt induced Seizures as a child.    Other      blueberries, raspberries, blackberries. Vaccination reaction.    Sulfamethoxazole-Trimethoprim Nausea Only    Food Nausea And Vomiting     Blue cheese per Pt.         Family History   Problem Relation Age of Onset    Mental Illness Mother     Diabetes Mother     Diabetes Father     Heart Disease Father     Lung Cancer Father          Social History     Socioeconomic History    Marital status: Divorced     Spouse name: Not on file    Number of children: Not on file    Years of education: Not on file    Highest education level: Not on file   Occupational History    Not on file   Tobacco Use    Smoking status: Never    Smokeless tobacco: Never   Vaping Use    Vaping Use: Never used   Substance and Sexual Activity    Alcohol use: Not Currently     Comment: 1 drink per year    Drug use: No    Sexual activity: Yes     Partners: Female   Other Topics Concern    Not on  file   Social History Narrative    Not on file     Social Determinants of Health     Financial Resource Strain: Not on file   Food Insecurity: Not on file   Transportation Needs: Not on file   Physical Activity: Not on file   Stress: Not on file   Social Connections: Not on file   Intimate Partner Violence: Not on file   Housing Stability: Not on file       Vitals:    04/24/23 1142   BP: 126/76   Pulse: 80   Resp: 16   SpO2: 96%         Wt Readings from Last 3 Encounters:   04/24/23  107.5 kg (237 lb)   02/27/23 105.7 kg (233 lb)   10/31/22 106.6 kg (235 lb)         BP Readings from Last 3 Encounters:   04/24/23 126/76   02/27/23 123/79   10/31/22 126/76           Hematology and Coagulation    Lab Results   Component Value Date/Time    WBC 7.3 02/27/2023 08:20 PM    RBC 5.48 02/27/2023 08:20 PM    HGB 15.8 02/27/2023 08:20 PM    HCT 46.5 02/27/2023 08:20 PM    MCV 84.9 02/27/2023 08:20 PM    MCH 28.8 02/27/2023 08:20 PM    MCHC 34.0 02/27/2023 08:20 PM    RDW 12.7 02/27/2023 08:20 PM    PLT 266 02/27/2023 08:20 PM    MPV 9.9 02/27/2023 08:20 PM         Chemistries    Lab Results   Component Value Date/Time    NA 139 02/27/2023 08:20 PM    K 3.9 02/27/2023 08:20 PM    CL 104 02/27/2023 08:20 PM    CO2 24 02/27/2023 08:20 PM    BUN 9 02/27/2023 08:20 PM    CREATININE 0.9 02/27/2023 08:20 PM    CALCIUM 8.5 02/27/2023 08:20 PM    BILITOT 0.2 06/22/2016 10:45 PM    ALKPHOS 74 06/22/2016 10:45 PM    AST 19 10/31/2022 12:22 PM    ALT 13 06/22/2016 10:45 PM     Lab Results   Component Value Date/Time    ALKPHOS 74 06/22/2016 10:45 PM    ALT 13 06/22/2016 10:45 PM    AST 19 10/31/2022 12:22 PM    BILITOT 0.2 06/22/2016 10:45 PM     Lab Results   Component Value Date/Time    BUN 9 02/27/2023 08:20 PM    CREATININE 0.9 02/27/2023 08:20 PM     Lab Results   Component Value Date/Time    CALCIUM 8.5 02/27/2023 08:20 PM    MG 2.0 02/27/2023 08:20 PM     Lab Results   Component Value Date/Time    AST 19 10/31/2022 12:22 PM    ALT 13 06/22/2016 10:45 PM         Review of Systems   Constitutional:  Positive for diaphoresis. Negative for appetite change, chills, fatigue, fever, unexpected weight change and weight loss.   HENT:  Negative for congestion, dental problem, drooling, ear discharge, ear pain, facial swelling, hearing loss, mouth sores, nosebleeds, postnasal drip, rhinorrhea, sinus pressure, sore throat, tinnitus, trouble swallowing and voice change.    Eyes:  Positive for blurred vision and photophobia.  Negative for pain, discharge, redness, itching and visual disturbance.   Respiratory:  Negative for apnea, cough, choking, chest tightness,  shortness of breath and wheezing.    Cardiovascular:  Negative for chest pain, palpitations and leg swelling.   Gastrointestinal:  Negative for abdominal distention, abdominal pain, anorexia, blood in stool, change in bowel habit, constipation, diarrhea, nausea and vomiting.   Endocrine: Negative for cold intolerance, heat intolerance, polydipsia, polyphagia and polyuria.   Musculoskeletal:  Positive for back pain, gait problem and neck pain. Negative for arthralgias, joint swelling, myalgias and neck stiffness.   Skin:  Negative for color change, pallor, rash and wound.   Allergic/Immunologic: Negative for environmental allergies, food allergies and immunocompromised state.   Neurological:  Positive for dizziness, tingling, weakness, light-headedness, numbness and loss of balance. Negative for vertigo, tremors, seizures, syncope, facial asymmetry, speech difficulty and headaches.   Hematological:  Negative for adenopathy. Does not bruise/bleed easily.   Psychiatric/Behavioral:  Positive for decreased concentration and sleep disturbance. Negative for agitation, behavioral problems, confusion, dysphoric mood, hallucinations, self-injury and suicidal ideas. The patient is nervous/anxious and has insomnia. The patient is not hyperactive.        OBJECTIVE:      Physical Exam  Constitutional:       Appearance: He is well-developed.   HENT:      Head: Normocephalic and atraumatic. No raccoon eyes or Battle's sign.      Right Ear: External ear normal.      Left Ear: External ear normal.      Nose: Nose normal.   Eyes:      Conjunctiva/sclera: Conjunctivae normal.      Pupils: Pupils are equal, round, and reactive to light.   Neck:      Thyroid: No thyroid mass or thyromegaly.      Vascular: No carotid bruit.      Trachea: No tracheal deviation.      Meningeal: Brudzinski's sign and  Kernig's sign absent.   Cardiovascular:      Rate and Rhythm: Normal rate and regular rhythm.   Pulmonary:      Effort: Pulmonary effort is normal.   Musculoskeletal:         General: No tenderness.      Cervical back: Normal range of motion and neck supple. No rigidity. No muscular tenderness. Normal range of motion.   Skin:     General: Skin is warm.      Coloration: Skin is not pale.      Findings: No erythema or rash.      Nails: There is no clubbing.   Psychiatric:         Attention and Perception: He is attentive.         Mood and Affect: Mood is not anxious or depressed. Affect is not labile, blunt or inappropriate.         Speech: He is communicative. Speech is not rapid and pressured, delayed, slurred or tangential.         Behavior: Behavior is not agitated, slowed, aggressive, withdrawn, hyperactive or combative. Behavior is cooperative.         Thought Content: Thought content is not paranoid or delusional. Thought content does not include homicidal or suicidal ideation. Thought content does not include homicidal or suicidal plan.         Cognition and Memory: Memory is impaired. He exhibits impaired recent memory. He does not exhibit impaired remote memory.         Judgment: Judgment is not impulsive or inappropriate.     NEUROLOGICALEXAMINATION :      A) MENTAL STATUS:  Alert and  oriented  To time, place  And  Person.                  No Aphasia.  No  Dysarthria.                    Able   To  Follow     SIMPLE    commands   without   Any  Difficulty.                   No right  To left confusion.                  Normal  Speech  And language function.                   Insight and  Judgment ,Fund  Of  Knowledge   within normal limits                Recent  And  Remote memory    DECREASED                Attention &  Concentration are        DECREASED                                            B) CRANIAL NERVES :        CN : Visual  Acuity  And  Visual fields  within normal limits                Fundi  Could  Not  Be  Could  Not  Be  Evaluated.           3,4,6 CN : Both  Pupils are   Reactive and  Equal.  Movements  Are  Intact.                             No  Nystagmus.  No  INO.  No  Afferent  Pupillary  Defect noted.          5 CN :  Normal  Facial sensations and Corneal  Reflexes           7 CN:  Normal  Facial  Symmetry  And  Strength.  No facial  Weakness.           8 CN :  Hearing  Appears within normal limits          9, 10 CN: Normal   spontaneous, reflex   palate   movements         11 CN:   Normal  Shoulder  shrug and  strength         12 CN :   Normal  Tongue movements and  Tongue  In midline                        No tongue   Fasciculations or atrophy         C) MOTOR  EXAM:                   Strength  In upper  And  Lower   extremities     DECREASED  MILD   RESIDUAL  LEFT  HEMIPARESIS                             DECREASED   HAND  GRIPS                   No  Drift.     No  Atrophy               Rapid   alternating  And  repetitions  Movements   DECREASED                 Muscle  Tone  In upper  And  Lower  Extremities  normal                No rigidity.  No  Spasticity.                 Bradykinesia   absent                 No  Asterixis.              Sustention  Tremor , Resting   Tremor   absent                    No   other  Abnormal  Movements noted           D) SENSORY :               Light   touch, pinprick,   position  And  Vibration    DECREASED      E) REFLEXES:                   Deep  Tendon  Reflexes    DECREASED                                              F) COORDINATION  AND  GAIT :                                Station and  Gait    SLOW     WITH  MILD  LIMP                           Romberg 's test    POSITIVE                            Ataxia negative          ASSESSMENT:        Patient Active Problem List   Diagnosis    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)    Bipolar II disorder (HCC)    Borderline personality  disorder (HCC)    Seizure disorder (HCC)    Headache    Insomnia    Low back pain    Lumbar spondylosis    Migraine without aura and without status migrainosus, not intractable    Obstructive sleep apnea syndrome in adult    Pituitary cyst (HCC)    Restless legs    Sleep apnea    H/O: stroke  Chronic tension-type headache, intractable    Memory problem    Dizziness    Eye pain    Abnormal EEG    Chronic cerebral ischemia    Middle cerebral artery stenosis, right    Parasomnia    Allergic reaction to drug    Adjustment disorder with depressed mood    Adjustment disorder with disturbance of conduct    Stress and adjustment reaction    Anxiety disorder    Panic disorder (episodic paroxysmal anxiety)    Generalized anxiety disorder    Other reactions to severe stress    Other specific personality disorders (HCC)    Panic disorder    Personality disorder (HCC)    Ataxia    Atypical chest pain    Chest wall contusion, right, initial encounter    Chronic post-traumatic stress disorder (PTSD)    Post-traumatic stress disorder, chronic    Constipation    Contusion of left forearm    Contusion of left leg    Dental caries    Dysphagia, unspecified    Dysphagia, pharyngoesophageal phase    Gastro-esophageal reflux disease without esophagitis    Gastroesophageal reflux disease    Heartburn    Hemorrhoids without complication    Hypertension    Localization-related (focal) (partial) symptomatic epilepsy and epileptic syndromes with complex partial seizures, intractable, without status epilepticus (HCC)    Major depressive disorder, recurrent, moderate (HCC)    Major depressive disorder, recurrent episode, mild (HCC)    Moderate recurrent major depression (HCC)    Major depressive disorder, recurrent, severe with psychotic symptoms (HCC)    Major depressive disorder, single episode, unspecified    Migraine, unspecified, not intractable, without status migrainosus    Migraine    Myopia    Nocturia    Open wound of face    Pain  in joint, pelvic region and thigh    Pain in throat    Prostatitis    Rash and other nonspecific skin eruption    Regular astigmatism    RLQ abdominal pain    Snoring    Strain of left shoulder    Stress fracture    Tinnitus    Unspecified visual field defects    Unspecified visual loss    Urinary frequency    Urinary urgency    Vitamin D deficiency    Vomiting    Weight gain       MRI OF THE BRAIN WITHOUT CONTRAST  10/23/2020 8:32 am       TECHNIQUE:   Multiplanar multisequence MRI of the brain was performed without the   administration of intravenous contrast.       COMPARISON:   None.       HISTORY:   ORDERING SYSTEM PROVIDED HISTORY: Bipolar II disorder, most recent episode   major depressive (HCC)   TECHNOLOGIST PROVIDED HISTORY:   migraine       Initial evaluation.       FINDINGS:   INTRACRANIAL STRUCTURES/VENTRICLES: There is no acute infarct. No mass effect   or midline shift. No evidence of an acute intracranial hemorrhage.  The   ventricles and sulci are normal in size and configuration.  The   sellar/suprasellar regions appear unremarkable.  The normal signal voids   within the major intracranial vessels appear maintained.       ORBITS: The visualized portion of the orbits demonstrate no acute abnormality.       SINUSES: Mucous retention cysts seen in the left maxillary sinus.  The   mastoid air cells demonstrate no acute abnormality.       BONES/SOFT TISSUES: The bone marrow signal intensity appears normal. The soft   tissues demonstrate no acute abnormality.           Impression   No acute intracranial abnormality.  No acute infarct.               VISITING DIAGNOSIS:      ICD-10-CM    1. Seizure disorder (HCC)  G40.909       2. Bipolar II disorder, most recent episode major depressive (HCC)  F31.81       3. Dizziness  R42       4. 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 Borderline personality disorder (HCC)  F60.3       6. Chronic neck pain  M54.2     G89.29       7. H/O: stroke  Z31.73       8. Primary  insomnia  F51.01       9. Other sleep apnea  G47.39       10. Low back pain with sciatica, sciatica laterality unspecified, unspecified back pain laterality, unspecified chronicity  M54.40       11. Memory problem  R41.3       12. Migraine without aura and without status migrainosus, not intractable  G43.009       13. Obstructive sleep apnea syndrome in adult  G47.33       14. Chronic tension-type headache, intractable  G44.221            CONCERNS   &   INCREASED   RISK   FOR        * TIA,  CEREBRO  VASCULAR  ISCHEMIA, STROKE     *   DIZZINESS,   VERTEBROBASILAR  INSUFFICIENCY ,         *  SEIZURE  ACTIVITY,  EPILEPSY ,        *   COMPLICATED  MIGRAINES      *    CHRONIC  TENSION  HEADACHES      *   COGNITIVE  &   MEMORY PROBLEMS        *  MONONEUROPATHIES,   RADICULOPATHY       *   PERIPHERAL  NEUROPATHY,       *  GAIT  DIFFICULTIES  &   BALANCE PROBLMES   AND  FALL            VARIOUS  RISK   FACTORS   WERE  REVIEWED   AND   DISCUSSED.          *  PATIENT   HAS  MULTIPLE   MEDICAL, NEUROLOGICAL                        AND   MENTAL HEALTH   PROBLEMS .           PATIENT'S   MANAGEMENT  IS  CHALLENGING.    PLAN:          Ashby Dawes  Of  The  Diagnoses,  The  Management & Treatment  Options            AND    Care  plan  Were          Reviewed and   Discussed   With  patient.           *  Goals  And  Expectations  Of  The  Therapy  Discussed   And  Reviewed.          *   Benefits   And   Side  Effect  Profile  Of  Medication/s   Were   Discussed                * Need   For  Further   Follow up For  The  Various  Problems Were  discussed.          * Results  Of  The  Previous  Diagnostic tests were reviewed and  discussed                   Medical  Decision  Making  Was  Made  Based on the   Complexity  Of  Above  Mentioned  Diagnoses,    Data reviewed             And    Risk  Of  Significant   Co morbidities and   complicating   Factors.                 Medical  Decision  Was     High    Complexity   Due   To  The  Patient's   Multiple  Symptoms  &  Disease,            Complex  Treatment  Regimen,  Multiple medications           and   Risk  Of   Side  Effects,  Difficulty  In  Medication  Management  And  Diagnostic  Challenges           In  View  Of  The  Associated   Co  Morbid  Conditions   And  Problems.         * FALL   PRECAUTIONS.      THESE  REVIEWED   AND  DISCUSSED    *  USE   WALKING  ASSISTANCE  DEVICES     QUAD  CANE    *   BE  CAREFUL  WITH  ACTIVITIES       *   AVOID   NECK  AND/ BACK  STRAINING  ACTIVITIE    *   ADEQUATE   FLUID  INTAKE   AND  ELECTROLYTE  BALANCE       * KEEP  DAIRY  OF   THE  NEUROLOGICAL  SYMPTOMS        RECORDING THE    DURATION  AND  FREQUENCY.    *  AVOID    CONDITIONS  AND  FACTORS   THAT  MAKE                  NEUROLOGICAL  SYMPTOMS  WORSE.    *   SEIZURE  PRECAUTIONS.  -  REVIEWED               A)  Avoid  Working  At   [employer].          B)  Avoid  Working  With  Engineer, manufacturing systems.          C)  Avoid   Swimming,  Climbing  A  Ladder   Unattended.          D)  Avoid   Driving   If  You  Have  A  Seizure.          E)  Must   Be  Seizure  Free   For  At   Least   6 months,  Before   You  Can drive.          F) Some times  Your  May  Feel  Seizure coming  Before  It  Begins.                You  May feelsmell or funny  Feeling  In  Your  Stomach,  Which is  Called   Aura.                     TIPS  TO  REDUCE/ PREVENT  SEIZURES         1.  Take  Your  Anti seizure  Medications   As   Recommended.       2.  Get   Enough   Sleep.   Sleep  Deprivation   Can  Trigger  A  Seizure.       3.  If   You   Have  A fever,  Treat  It  At  Once,  And  Contact   Your  Primary  Care Providers.                                                                                                                           4. Avoid   Alcohol.       5. Avoid  Flashing  Lights,  Loud  Noises and  TV  And  Video  Games,  As   These  May  Trigger   Your  Seizures       6.  Control  Your  Stress  And   Have  Adequate  Rest.       7.    If  You  Feel  A  Seizure  Coming On :             A) warn people  Who  Are  With  You             B)  Make  Sure  There  Are  No  Sharp or  Hard  Objects  Around you.             C)  Lay down  On  Your  Side  And  Relax.      *TO  MAINTAIN  REGULAR  SLEEP  WAKE  CYCLES.     *   TO  HAVE  ADEQUATE  REST  AND   SLEEP    HOURS.    *    AVOID  ANY USAGE OF    TOBACCO,          AVOID  EXCESSIVE  ALCOHOL  AND   ILLEGAL   SUBSTANCES    *  CONTINUE   MEDICATIONS    PRESCRIBED       AS    RECOMMENDED     *   Compliance   With  Medications   And  Instructions    *    MIGRAINE/ HEADACHE    DAIRY   WITH  MONITORING                       OF  DURATION  AND  FREQUENCY.      May   Use  Pill  Box,    If  Needed    *  MEDICATIONS TO AVOID:    WELLBUTRIN,  ULTRAM      *    Antiplatelet  therapy    As   Recommended  Was   Discussed      *    Prophylactic  Use   Of     Vitamin   B   Complex,  Folic  Acid,    Vitamin  B12    Multivitamin,       Calcium  With  magnesium  And  Vit D    Supplementations   Over  The  Counter  Discussed               *  PATIENT  IS  ALSO   COUNSELED   TO  KEEP    ACTIVITIES:         A)   SIMPLE      B)  ORGANIZED      C)  WRITEDOWN                     *  EVALUATIONS  AND  FOLLOW UP:   PERIODICALLY                  * PHYSICAL  THERAPY                                 *CARDIOLOGY              *   OPTHALMOLOGY                               *   RECOMMENDATIONS  :                                                        A)   SEIZURE  PRECAUTIONS                             B)   SEIZURE  DAIRY                              C)   COMPLIANCE  WITH   ANTI EPILEPTIC MEDICATION   AND                                           MEDICAL  ADVICE  D)    FALL PRECAUTIONS                                E)    TO  CONTINUE   DEPAKOTE    500   MG    PO   TID                                            FOR   MIGRAINE    AND   SEIZURE    MANAGEMENT                               F)     ASA    81   MG  PO     DAILY                                  G)     IMITREX  ,   UBRELVY       AS   NEEDED                                             FOR   MIGRAINES  HEADACHES                               H)    PERIODIC  LABS  INCLUDING   DEPAKOTE  LEVEL                              I)    FOLLOW  UP    WITH    PCP,   OTHER  CONSULTANTS                                           AND  MENTAL  HEALTH PROFESSIONALS                             J)     FOLLOW   UP    NEURO  DIAGNOSTIC   EVALUATIONS                                         CLEARANCE  FOR  SURGERY  AS  REQUESTED                                                          --   REVIEWED     AND  DISCUSSED    WITH  PATIENT  Controlled Substances Monitoring: Periodic Controlled Substance Monitoring: Possible medication side effects, risk of tolerance/dependence & alternative treatments discussed., No signs of potential drug abuse or diversion identified. (Policherla, Renard Matter, MD)              Orders Placed This Encounter   Procedures    CT HEAD WO CONTRAST    XR CERVICAL SPINE 1 VW    CBC    Electrolyte Panel    Valproic Acid Level, Total    EEG       Orders Placed This Encounter   Medications    butalbital-acetaminophen-caffeine (ESGIC) 50-325-40 MG per tablet     Sig: ONE  TO  TWO  TABLETS  IN  THE  EVENING  AND  BED TIME  AS  NEEDED     Dispense:  60 tablet     Refill:  1    divalproex (DEPAKOTE) 500 MG DR tablet     Sig: Take 1 tablet by mouth 3 times daily ONE  TABLET    3      TIMES  DAILY     Dispense:  90 tablet     Refill:  5    SUMAtriptan (IMITREX) 100 MG tablet     Sig: ONE   TABLET     TWICE  DAILY  AS  NEEDED   FOR  MIGRAINES     Dispense:  12 tablet     Refill:  2    Ubrogepant (UBRELVY) 100 MG TABS     Sig: Take 100 mg by mouth 2 times daily as needed (FOR  MIGRAINE)     Dispense:  30 tablet     Refill:  2               *PATIENT   TO  FOLLOW  UP  WITH   PRIMARY  CARE         OTHER  CONSULTANTS  AS   BEFORE.               *TO  FOLLOW  WITH   MENTAL  HEALTH  PROFESSIONALS ,  INCLUDING            PSYCHOLOGICAL  COUNSELING   AND  PSYCHIATRIC  EVALUATIONS                    --    REVIEWED     AND  DISCUSSED    WITH  PATIENT                         *  Maintain   Healthy  Life Style    With   Periodic  Monitoring  Of          Any  Medical  Conditions  Including   Elevated  Blood  Pressure,  Lipid  Profile,        Blood  Sugar levels  AndHeart  Disease.                *   Period   Screening  For  Cancers  Involving  Breast,  Colon,         Lungs  And  Other  Organs  As  Applicable,  In consultation   With  Your  Primary Care Providers.               *Second  Neurological  Opinion  And  Evaluations  In  A  Teaching   Hospital  Setting  If  Patient  Is  Interested.                 * Please   Contact   Neurology  Clinic   Early   If   Are  Any  New  Neurological   And  Any neurological  Concerns.                   *  If  The  Patient remains  Neurologically  Stable   Return   To  [facility] Neurology Department   IN      2        MONTHS  TIME   FOR  FURTHER              FOLLOW UP.                       *   The  Neurological   Findings,  Possible  Diagnosis,  Differential diagnoses                    And  Options  For    Further   Investigations                   And  management   Are  Discussed  Comprehensively.                    Medications   And  Prescription   Risks  And  Side effects  Are   Also  Discussed.                     *  If   There is  Any  Significant  Worsening   Of  Current  Symptoms  And  Or                  If patient  Develops   Any additional  New  NeurologicalSymptoms                  Or  Significant  Concerns   Should  Call  911 or  Go  To  Emergency  Department                  For  Further  Immediate  Evaluation and  management .                         The   Above  Were  Reviewed  With  PATIENT   and                         questions  Answered  In  Detail.                                                               TOTAL   TIME     SPENT :                On this date   05/302024   I have spent  40  minutes   reviewing previous notes, test results               and face to face time with the patient including   discussing the diagnosis and importance of compliance               with the treatment plan  as well as documenting on the day of the visit.                             Electronically signed by   Luiz Iron,  M.D., [place].     Board Certified in  Neurology &  In  Brain Injury Medicine   American Board of Psychiatry and Neurology (ABPN)      DISCLAIMER:   Although every effort was made to ensure the accuracy of this  electronictranscription, some errors in transcription may have occurred.      GENERAL PATIENT INSTRUCTIONS:     A Healthy Lifestyle: Care Instructions  Your Care Instructions  A healthy lifestyle can help you feel good, stay at ahealthy weight, and have plenty of energy for both work and play. A healthy lifestyle is something you can share with your whole family.  A healthy lifestyle also can lower your risk for serious health problems, such ashigh blood pressure, heart disease, and diabetes.  You can follow a few steps listed below to improve your health and the health of your family.  Follow-up careis a key part of your treatment and safety. Be sure to make and go to all appointments, and call your doctor if you are having problems. It's also a good idea to know your test results and keep a list of the medicines you take.  How can you care for yourself at home?  Do not eat too much sugar, fat, or fast foods. You can still have dessert and treats nowand then. The goal is moderation.  Start small to improve your eating habits. Pay attention to portion sizes, drink less juice and soda pop, and eat more fruits and vegetables.  Eat a healthy amount of food. A 3-ounce serving of meat, for example, is about the size of a deck of cards. Fill the rest of your  plate with vegetables and whole grains.  Limit theamount of soda and sports drinks you have every day. Drink more water when you are thirsty.  Eat at least 5 servings of fruits and vegetables every day. It may seem like a lot, but it is not hard to reach this goal. Aserving or helping is 1 piece of fruit, 1 cup of vegetables, or 2 cups of leafy, raw vegetables. Have an apple or some carrot sticks as an afternoon snack instead of a candy bar. Try to have fruits and/or vegetables at everymeal.  Make exercise part of your daily routine. You may want to start with simple activities, such as walking, bicycling, or slow swimming. Try tobe active 30 to 60 minutes every day. You do not need to do all 30 to 60 minutes all at once. For example, you can exercise 3 times a day for 10 or 20 minutes. Moderate exercise is safe for most people, but it is always agood idea to talk to your doctor before starting an exercise program.  Keep moving. Mow the lawn, work in the garden, or [employer]. Take the stairs instead of the elevator at work.  If you  smoke, quit. Peoplewho smoke have an increased risk for heart attack, stroke, cancer, and other lung illnesses. Quitting is hard, but there are ways to boost your chance of quitting tobacco for good.  Use nicotine gum, patches, or lozenges.  Ask your doctor about stop-smoking programs and medicines.  Keep trying.  In addition to reducing your risk of diseases in the future, you will notice some benefits soon after you stop using tobacco. If you have shortness of breath or asthma symptoms, they will likely getbetter within a few weeks after you quit.  Limit how much alcohol you drink. Moderate amounts of alcohol (up to 2 drinks a day for men, 1drink a day for women) are okay. But drinking too much can lead to liver problems, high blood pressure, and other health problems.  health  If you have a family, there are many things you can do together to improve your health.  Eat meals  together as a family as often as possible.  Eat healthy foods. This includes fruits, vegetables, lean meats and dairy, and whole grains.  Include your family in your fitness plan. Most peoplethink of activities such as jogging or tennis as the way to fitness, but there are many ways you and your family can be more active. Anything that makes you breathe hard and gets your heart pumping is exercise. Here are sometips:  Walk to do errands or to take your child to school or the bus.  Go for a family bike ride after dinner instead of watching TV.  Where can you learn more?  Go [email or web address] and sign in to your MyChart account. Enter [phone] in the Search HealthInformation box to learn more about "A Healthy Lifestyle: Care Instructions."     If you do not have anaccount, please click on the "Sign Up Now" link.  Current as of: June 20, 2015  Content Version: 11.2   2006-2017 [employer]. Care instructions adapted under license by [facility]. If you have questions about a medical condition or this instruction, always ask your healthcare professional. [employer] disclaims any warranty or liability for your use of this information.

## 2023-05-26 LAB — SURGICAL PATHOLOGY REPORT

## 2023-07-09 ENCOUNTER — Ambulatory Visit: Payer: PRIVATE HEALTH INSURANCE | Attending: Neurology

## 2023-07-25 ENCOUNTER — Ambulatory Visit: Payer: PRIVATE HEALTH INSURANCE | Attending: Neurology

## 2023-08-07 ENCOUNTER — Encounter: Payer: PRIVATE HEALTH INSURANCE | Attending: Neurology

## 2023-11-06 ENCOUNTER — Encounter: Payer: PRIVATE HEALTH INSURANCE | Attending: Neurology
